# Patient Record
Sex: Female | Born: 1970 | Race: Black or African American | Hispanic: No | Marital: Single | Smoking: Never smoker
Health system: Southern US, Community
[De-identification: ages and names within clinical notes are randomized; demographics above are authoritative.]

## PROBLEM LIST (undated history)

## (undated) DIAGNOSIS — J45909 Unspecified asthma, uncomplicated: Secondary | ICD-10-CM

## (undated) DIAGNOSIS — N289 Disorder of kidney and ureter, unspecified: Secondary | ICD-10-CM

## (undated) DIAGNOSIS — E079 Disorder of thyroid, unspecified: Secondary | ICD-10-CM

## (undated) DIAGNOSIS — E039 Hypothyroidism, unspecified: Secondary | ICD-10-CM

## (undated) DIAGNOSIS — D649 Anemia, unspecified: Secondary | ICD-10-CM

## (undated) DIAGNOSIS — F419 Anxiety disorder, unspecified: Secondary | ICD-10-CM

## (undated) HISTORY — PX: FOOT SURGERY: SHX648

## (undated) HISTORY — DX: Morbid (severe) obesity due to excess calories: E66.01

## (undated) HISTORY — PX: TUBAL LIGATION: SHX77

## (undated) HISTORY — DX: Hypothyroidism, unspecified: E03.9

## (undated) HISTORY — PX: BREAST BIOPSY: SHX20

## (undated) HISTORY — DX: Unspecified asthma, uncomplicated: J45.909

---

## 2006-09-20 ENCOUNTER — Emergency Department: Payer: Self-pay | Admitting: Emergency Medicine

## 2008-01-05 ENCOUNTER — Emergency Department: Payer: Self-pay | Admitting: Emergency Medicine

## 2008-03-26 ENCOUNTER — Emergency Department: Payer: Self-pay | Admitting: Emergency Medicine

## 2009-06-16 ENCOUNTER — Emergency Department: Payer: Self-pay | Admitting: Emergency Medicine

## 2009-06-24 ENCOUNTER — Ambulatory Visit: Payer: Self-pay | Admitting: Family Medicine

## 2009-06-24 ENCOUNTER — Ambulatory Visit: Payer: Self-pay | Admitting: Emergency Medicine

## 2011-02-19 ENCOUNTER — Emergency Department: Payer: Self-pay | Admitting: Emergency Medicine

## 2012-03-01 ENCOUNTER — Emergency Department: Payer: Self-pay | Admitting: Emergency Medicine

## 2012-03-01 LAB — CK TOTAL AND CKMB (NOT AT ARMC)
CK, Total: 138 U/L (ref 21–215)
CK-MB: 0.9 ng/mL (ref 0.5–3.6)

## 2012-03-01 LAB — URINALYSIS, COMPLETE
Blood: NEGATIVE
Ph: 5 (ref 4.5–8.0)
RBC,UR: 1 /HPF (ref 0–5)
WBC UR: 3 /HPF (ref 0–5)

## 2012-03-01 LAB — DRUG SCREEN, URINE
Barbiturates, Ur Screen: NEGATIVE (ref ?–200)
Benzodiazepine, Ur Scrn: NEGATIVE (ref ?–200)
Cannabinoid 50 Ng, Ur ~~LOC~~: NEGATIVE (ref ?–50)
MDMA (Ecstasy)Ur Screen: NEGATIVE (ref ?–500)
Methadone, Ur Screen: NEGATIVE (ref ?–300)
Phencyclidine (PCP) Ur S: NEGATIVE (ref ?–25)

## 2012-03-01 LAB — COMPREHENSIVE METABOLIC PANEL
Albumin: 3.7 g/dL (ref 3.4–5.0)
Alkaline Phosphatase: 63 U/L (ref 50–136)
BUN: 12 mg/dL (ref 7–18)
Creatinine: 1.17 mg/dL (ref 0.60–1.30)
EGFR (African American): >60
EGFR (Non-African Amer.): 59 — ABNORMAL LOW
Glucose: 101 mg/dL — ABNORMAL HIGH (ref 65–99)
Potassium: 3.5 mmol/L (ref 3.5–5.1)
Sodium: 141 mmol/L (ref 136–145)
Total Protein: 7.7 g/dL (ref 6.4–8.2)

## 2012-03-01 LAB — CBC
HCT: 35.8 % (ref 35.0–47.0)
HGB: 11.6 g/dL — ABNORMAL LOW (ref 12.0–16.0)
MCH: 28.6 pg (ref 26.0–34.0)
Platelet: 238 10*3/uL (ref 150–440)
RDW: 14 % (ref 11.5–14.5)
WBC: 6.8 10*3/uL (ref 3.6–11.0)

## 2012-03-01 LAB — TROPONIN I: Troponin-I: 0.02 ng/mL

## 2012-03-01 LAB — LIPASE, BLOOD: Lipase: 95 U/L (ref 73–393)

## 2013-09-07 ENCOUNTER — Emergency Department: Payer: Self-pay | Admitting: Emergency Medicine

## 2013-09-07 LAB — COMPREHENSIVE METABOLIC PANEL
ANION GAP: 4 — AB (ref 7–16)
AST: 20 U/L (ref 15–37)
Albumin: 3.8 g/dL (ref 3.4–5.0)
Alkaline Phosphatase: 57 U/L
BUN: 10 mg/dL (ref 7–18)
Bilirubin,Total: 0.8 mg/dL (ref 0.2–1.0)
CALCIUM: 9.1 mg/dL (ref 8.5–10.1)
CREATININE: 0.96 mg/dL (ref 0.60–1.30)
Chloride: 106 mmol/L (ref 98–107)
Co2: 28 mmol/L (ref 21–32)
EGFR (African American): >60
GLUCOSE: 86 mg/dL (ref 65–99)
Osmolality: 274 (ref 275–301)
POTASSIUM: 4.1 mmol/L (ref 3.5–5.1)
SGPT (ALT): 27 U/L (ref 12–78)
SODIUM: 138 mmol/L (ref 136–145)
TOTAL PROTEIN: 8 g/dL (ref 6.4–8.2)

## 2013-09-07 LAB — URINALYSIS, COMPLETE
BILIRUBIN, UR: NEGATIVE
Blood: NEGATIVE
Glucose,UR: NEGATIVE mg/dL (ref 0–75)
Ketone: NEGATIVE
Leukocyte Esterase: NEGATIVE
Nitrite: POSITIVE
PROTEIN: NEGATIVE
Ph: 5 (ref 4.5–8.0)
Specific Gravity: 1.02 (ref 1.003–1.030)
Squamous Epithelial: NONE SEEN

## 2013-09-07 LAB — CBC
HCT: 38.1 % (ref 35.0–47.0)
HGB: 12.3 g/dL (ref 12.0–16.0)
MCH: 27.5 pg (ref 26.0–34.0)
MCHC: 32.2 g/dL (ref 32.0–36.0)
MCV: 86 fL (ref 80–100)
PLATELETS: 259 10*3/uL (ref 150–440)
RBC: 4.46 10*6/uL (ref 3.80–5.20)
RDW: 13.7 % (ref 11.5–14.5)
WBC: 6.6 10*3/uL (ref 3.6–11.0)

## 2013-09-07 LAB — LIPASE, BLOOD: Lipase: 90 U/L (ref 73–393)

## 2013-09-07 LAB — TROPONIN I

## 2013-09-07 LAB — PREGNANCY, URINE: PREGNANCY TEST, URINE: NEGATIVE m[IU]/mL

## 2014-03-14 ENCOUNTER — Ambulatory Visit: Payer: Self-pay

## 2014-06-26 ENCOUNTER — Emergency Department: Payer: Self-pay | Admitting: Emergency Medicine

## 2015-09-29 ENCOUNTER — Encounter: Payer: Self-pay | Admitting: Emergency Medicine

## 2015-09-29 ENCOUNTER — Emergency Department
Admission: EM | Admit: 2015-09-29 | Discharge: 2015-09-29 | Disposition: A | Discharge status: Home / Self Care | Payer: Self-pay | Attending: Emergency Medicine | Admitting: Emergency Medicine

## 2015-09-29 DIAGNOSIS — N643 Galactorrhea not associated with childbirth: Secondary | ICD-10-CM | POA: Insufficient documentation

## 2015-09-29 DIAGNOSIS — F419 Anxiety disorder, unspecified: Secondary | ICD-10-CM | POA: Insufficient documentation

## 2015-09-29 HISTORY — DX: Disorder of thyroid, unspecified: E07.9

## 2015-09-29 LAB — BASIC METABOLIC PANEL
Anion gap: 5 (ref 5–15)
BUN: 16 mg/dL (ref 6–20)
CHLORIDE: 109 mmol/L (ref 101–111)
CO2: 24 mmol/L (ref 22–32)
CREATININE: 1.05 mg/dL — AB (ref 0.44–1.00)
Calcium: 9.2 mg/dL (ref 8.9–10.3)
Glucose, Bld: 88 mg/dL (ref 65–99)
Potassium: 4 mmol/L (ref 3.5–5.1)
Sodium: 138 mmol/L (ref 135–145)

## 2015-09-29 LAB — CBC WITH DIFFERENTIAL/PLATELET
Basophils Absolute: 0.1 10*3/uL (ref 0–0.1)
Basophils Relative: 1 %
Eosinophils Absolute: 0.2 10*3/uL (ref 0–0.7)
Eosinophils Relative: 3 %
HEMATOCRIT: 35.5 % (ref 35.0–47.0)
HEMOGLOBIN: 11.5 g/dL — AB (ref 12.0–16.0)
LYMPHS ABS: 3 10*3/uL (ref 1.0–3.6)
LYMPHS PCT: 43 %
MCH: 27 pg (ref 26.0–34.0)
MCHC: 32.4 g/dL (ref 32.0–36.0)
MCV: 83.5 fL (ref 80.0–100.0)
Monocytes Absolute: 0.6 10*3/uL (ref 0.2–0.9)
Monocytes Relative: 9 %
NEUTROS PCT: 44 %
Neutro Abs: 3.1 10*3/uL (ref 1.4–6.5)
Platelets: 250 10*3/uL (ref 150–440)
RBC: 4.25 MIL/uL (ref 3.80–5.20)
RDW: 14.4 % (ref 11.5–14.5)
WBC: 6.9 10*3/uL (ref 3.6–11.0)

## 2015-09-29 LAB — URINALYSIS COMPLETE WITH MICROSCOPIC (ARMC ONLY)
Bacteria, UA: NONE SEEN
Bilirubin Urine: NEGATIVE
Glucose, UA: NEGATIVE mg/dL
KETONES UR: NEGATIVE mg/dL
LEUKOCYTES UA: NEGATIVE
Nitrite: NEGATIVE
PH: 5 (ref 5.0–8.0)
PROTEIN: NEGATIVE mg/dL
Specific Gravity, Urine: 1.021 (ref 1.005–1.030)

## 2015-09-29 LAB — POCT PREGNANCY, URINE: PREG TEST UR: NEGATIVE

## 2015-09-29 NOTE — Discharge Instructions (Signed)
Galactorrhea Galactorrhea is an abnormal milky discharge from the breast. The discharge may come from one or both nipples. The fluid is often white, yellow, or green. It is different from the normal milk produced in nursing mothers. Galactorrhea usually occurs in women, but it can sometimes affect men. Various things can cause galactorrhea. It is often caused by irritation of the breast, which can result from injury, stimulation during sexual activity, or clothes rubbing against the nipple. It may also be related to medicines or changes in hormone levels. In many cases, galactorrhea will go away without treatment. However, galactorrhea can also be a sign of something more serious, such as diseases of the kidney or thyroid or problems with the pituitary gland. Your health care provider may do various tests to help determine the cause. Sometimes the cause is unknown. It is important to monitor your condition to make sure that it goes away. HOME CARE INSTRUCTIONS  Watch your condition for any changes. The following actions may help to lessen any discomfort that you are feeling:  Take medicines only as directed by your health care provider.  Do not squeeze your breasts or nipples.  Avoid breast stimulation during sexual activity.   Perform a breast self-exam once a month. Doing this more often can irritate your breasts.  Avoid clothes that rub on your nipples.  Use breast pads to absorb the discharge.  Wear a breast binder or a support bra to help prevent clothes from rubbing on your nipples.  Keep all follow-up visits as directed by your health care provider. This is important. SEEK MEDICAL CARE IF:  You develop hot flashes, vaginal dryness, or a lack of sexual desire.  You stop having menstrual periods, or they are irregular or far apart.  You have headaches.  You have vision problems. SEEK IMMEDIATE MEDICAL CARE IF:  You have breast discharge that is bloody or puslike.  You have  breast pain.  You feel a lump in your breast.  You have wrinkling or dimpling on your breast.  Your breast becomes red and swollen.   This information is not intended to replace advice given to you by your health care provider. Make sure you discuss any questions you have with your health care provider.   Document Released: 09/24/2004 Document Revised: 09/07/2014 Document Reviewed: 03/20/2014 Elsevier Interactive Patient Education 2016 Elsevier Inc.  

## 2015-09-29 NOTE — ED Notes (Signed)
States breasts are leaking fluid.  Describes fluid as clear/ oily secretions.  Patient noticed this today.  Also, noticed a swollen area to neck/ scapular area. Onset of symptom last week.  C/O swollen area being tender.

## 2015-09-29 NOTE — ED Provider Notes (Signed)
[facility] Emergency Department Provider Note  ____________________________________________    I have reviewed the triage vital signs and the nursing notes.   HISTORY  Chief Complaint Breast Problem    HPI Megan [name] is a 45 y.o. female who presents with complaints of nipple discharge from both breasts today. She describes the fluid as clear and oily. She denies pain in her breast. She has not felt any masses. Over the last day they have felt tight and then today she had secretions from both breasts which she noticed in her bra. She had a mammogram one year ago. Denies fevers or chills. No tenderness. No redness     Past Medical History  Diagnosis Date  . Thyroid disease     Hypothyroidism    There are no active problems to display for this patient.   Past Surgical History  Procedure Laterality Date  . Cesarean section    . Tubal ligation      No current outpatient prescriptions on file.  Allergies Review of patient's allergies indicates no known allergies.  No family history on file.  Social History Social History  Substance Use Topics  . Smoking status: Never Smoker   . Smokeless tobacco: Never Used  . Alcohol Use: No    Review of Systems  Constitutional: Negative for fever.  ENT: Negative for sore throat Cardiovascular: Negative for breast pain     Skin: Negative for rash.  Psychiatric: Positive for anxiety    ____________________________________________   PHYSICAL EXAM:  VITAL SIGNS: ED Triage Vitals  Enc Vitals Group     BP 09/29/15 1528 124/75 mmHg     Pulse Rate 09/29/15 1528 72     Resp 09/29/15 1528 18     Temp 09/29/15 1528 98 F (36.7 C)     Temp Source 09/29/15 1528 Oral     SpO2 09/29/15 1528 100 %     Weight 09/29/15 1528 200 lb (90.719 kg)     Height 09/29/15 1528  (1.651 m)     Head Cir --      Peak Flow --      Pain Score 09/29/15 1530 5     Pain Loc --      Pain Edu? --       Excl. in GC? --      Constitutional: Alert and oriented. Well appearing and in no distress. Eyes: Conjunctivae are normal.  ENT   Head: Normocephalic and atraumatic.   Mouth/Throat: Mucous membranes are moist. Cardiovascular: Normal rate, regular rhythm. Normal and symmetric distal pulses are present in all extremities. Breast exam is unremarkable, there is some nodularity bilaterally although I'm unable to express any discharge. No erythema or signs of infection. No Orange peel sign Respiratory: Normal respiratory effort without tachypnea nor retractions.  Gastrointestinal: Soft and non-tender in all quadrants. No distention. There is no CVA tenderness. Genitourinary: deferred  Neurologic:  Normal speech and language. No gross focal neurologic deficits are appreciated. Skin:  Skin is warm, dry and intact. No rash noted. Psychiatric: Mood and affect are normal. Patient exhibits appropriate insight and judgment.  ____________________________________________    LABS (pertinent positives/negatives)  Labs Reviewed  URINALYSIS COMPLETEWITH MICROSCOPIC ([facility] ONLY) - Abnormal; Notable for the following:    Color, Urine YELLOW (*)    APPearance CLEAR (*)    Hgb urine dipstick 3+ (*)    Squamous Epithelial / LPF 0-5 (*)    All other components within normal limits  CBC WITH  DIFFERENTIAL/PLATELET - Abnormal; Notable for the following:    Hemoglobin 11.5 (*)    All other components within normal limits  BASIC METABOLIC PANEL - Abnormal; Notable for the following:    Creatinine, Ser 1.05 (*)    All other components within normal limits  POC URINE PREG, ED  POCT PREGNANCY, URINE    ____________________________________________   EKG  None  ____________________________________________    RADIOLOGY I have personally reviewed any xrays that were ordered on this patient: None  ____________________________________________   PROCEDURES  Procedure(s) performed:  none  Critical Care performed: none  ____________________________________________   INITIAL IMPRESSION / ASSESSMENT AND PLAN / ED COURSE  Pertinent labs & imaging results that were available during my care of the patient were reviewed by me and considered in my medical decision making (see chart for details).  Patient presents with bilateral nipple discharge today. This appears to have resolved. She is not pregnant. No evidence of infection. No masses felt. Suspect galactorrhea. Recommend follow-up with gynecology  ____________________________________________   FINAL CLINICAL IMPRESSION(S) / ED DIAGNOSES  Final diagnoses:  Galactorrhea     Jene Every, MD 09/29/15 1800

## 2015-09-29 NOTE — ED Notes (Signed)
Discussed discharge instructions, prescriptions, and follow-up care with patient. No questions or concerns at this time. Pt stable at discharge.  

## 2015-11-04 ENCOUNTER — Encounter (INDEPENDENT_AMBULATORY_CARE_PROVIDER_SITE_OTHER): Payer: Self-pay

## 2015-11-04 ENCOUNTER — Encounter: Payer: Self-pay | Admitting: *Deleted

## 2015-11-04 ENCOUNTER — Ambulatory Visit: Payer: Self-pay | Attending: Oncology | Admitting: *Deleted

## 2015-11-04 VITALS — BP 122/70 | HR 73 | Temp 97.7°F | Ht 66.14 in | Wt 226.2 lb

## 2015-11-04 DIAGNOSIS — Z0000 Encounter for general adult medical examination without abnormal findings: Secondary | ICD-10-CM

## 2015-11-04 DIAGNOSIS — N6452 Nipple discharge: Secondary | ICD-10-CM

## 2015-11-04 NOTE — Progress Notes (Signed)
Subjective:     Patient ID: Megan [name], female   DOB: Aug 02, 1971, 45 y.o.   MRN: [id number]  HPI   Review of Systems     Objective:   Physical Exam  Pulmonary/Chest: Right breast exhibits no inverted nipple, no mass, no nipple discharge, no skin change and no tenderness. Left breast exhibits no inverted nipple, no mass, no nipple discharge, no skin change and no tenderness. Breasts are asymmetrical.  Left breast lower than the right breast  Abdominal: There is no splenomegaly or hepatomegaly.  Genitourinary: Cervix exhibits no motion tenderness, no discharge and no friability. Right adnexum displays no mass, no tenderness and no fullness. Left adnexum displays no mass, no tenderness and no fullness. No erythema, tenderness or bleeding in the vagina. No foreign body around the vagina. No signs of injury around the vagina. No vaginal discharge found.       Assessment:     45 [facility] Black [facility].  States she has had one episode of spontaneous clear nipple discharge that soaked through her bra and her shirt a couple of weeks ago.  States she also experienced some "tightening" of the left breast at the same time.  Family history of breast cancer includes a paternal great grandmother.  On clinical breast exam there is no dominant mass, skin changes, nipple discharge or lymphadenopathy.  Taught self breast awareness.  Specimen collected for pap smear without difficulty.  Patient has been screened for eligibility.  She does not have any insurance, Medicare or Medicaid.  She also meets financial eligibility.  Hand-out given on the Affordable Care Act.    Plan:     Bilateral diagnostic mammogram with ultrasound ordered.  Discussed with patient that if no findings on imaging will refer for surgical consult.  Patient is agreeable.  Specimen for pap smear sent to the lab. Will follow-up per [facility] protocol.

## 2015-11-04 NOTE — Patient Instructions (Signed)
Gave patient hand-out, Women Staying Healthy, Active and Well from [facility], with education on breast health, pap smears, heart and colon health. 

## 2015-11-07 LAB — PAP LB AND HPV HIGH-RISK
HPV, HIGH-RISK: NEGATIVE
PAP SMEAR COMMENT: 0

## 2015-11-15 ENCOUNTER — Ambulatory Visit: Payer: Self-pay

## 2015-11-15 ENCOUNTER — Other Ambulatory Visit: Payer: Self-pay

## 2015-11-18 ENCOUNTER — Ambulatory Visit
Admission: RE | Admit: 2015-11-18 | Discharge: 2015-11-18 | Disposition: A | Discharge status: Home / Self Care | Payer: Self-pay | Source: Ambulatory Visit | Attending: Oncology | Admitting: Oncology

## 2015-11-18 DIAGNOSIS — N6452 Nipple discharge: Secondary | ICD-10-CM

## 2015-11-19 ENCOUNTER — Encounter: Payer: Self-pay | Admitting: *Deleted

## 2015-11-19 ENCOUNTER — Encounter: Payer: Self-pay | Admitting: General Surgery

## 2015-11-20 ENCOUNTER — Encounter: Payer: Self-pay | Admitting: General Surgery

## 2015-11-20 NOTE — Progress Notes (Signed)
Talked to patient today and reviewed her birads 3 mammogram results.  Discussed with the patient that since her nipple discharge was in the same breast as the noted nodule on mammography, I felt it would be prudent to have a surgical consultation for further evaluation.  Patient is agreeable to the plan.  Joellyn QuailsChristy Burton will schedule [facility].  Will follow [facility] as [facility].

## 2015-12-03 ENCOUNTER — Encounter: Payer: Self-pay | Admitting: General Surgery

## 2015-12-03 ENCOUNTER — Ambulatory Visit (INDEPENDENT_AMBULATORY_CARE_PROVIDER_SITE_OTHER): Payer: PRIVATE HEALTH INSURANCE | Admitting: General Surgery

## 2015-12-03 VITALS — BP 130/74 | HR 82 | Resp 12 | Ht 65.0 in | Wt 223.0 lb

## 2015-12-03 DIAGNOSIS — N6452 Nipple discharge: Secondary | ICD-10-CM | POA: Diagnosis not present

## 2015-12-03 DIAGNOSIS — R928 Other abnormal and inconclusive findings on diagnostic imaging of breast: Secondary | ICD-10-CM

## 2015-12-03 NOTE — Patient Instructions (Signed)
The patient is aware to call back for any questions or concerns.  

## 2015-12-03 NOTE — Progress Notes (Signed)
Patient ID: Megan [name], female   DOB: October 30, 1970, 45 y.o.   MRN: [id number]  Chief Complaint  Patient presents with  . Other    mammogram    HPI Megan [name] is a 45 y.o. female who presents for a breast evaluation. The most recent mammogram was done on 11/18/15.  Patient does perform regular self breast checks and gets doe snot get regular mammograms done.  She states she noticed left nipple discharge-clear in January and she went to the ED. She has not noticed andy since that episode. She can;'t feel a lump she stats it just feels tight. Denis pain, breast injury or trauma.  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 She has a 43 year old son and her 43 year old daughter with special needs passed at age 584.  HPI  Past Medical History  Diagnosis Date  . Thyroid disease     Hypothyroidism    Past Surgical History  Procedure Laterality Date  . Cesarean section    . Tubal ligation      Family History  Problem Relation Age of Onset  . Breast cancer Maternal Grandmother     great grandmother    Social History Social History  Substance Use Topics  . Smoking status: Never Smoker   . Smokeless tobacco: Never Used  . Alcohol Use: No    No Known Allergies  No current outpatient prescriptions on file.   No current facility-administered medications for this visit.    Review of Systems Review of Systems  Constitutional: Negative.   Respiratory: Negative.   Cardiovascular: Negative.     Blood pressure 130/74, pulse 82, resp. rate 12, height 5\' 5"  (1.651 m), weight 223 lb (101.152 kg), last menstrual period 10/23/2015.  Physical Exam Physical Exam  Constitutional: She is oriented to person, place, and time. She appears well-developed and well-nourished.  HENT:  Mouth/Throat: Oropharynx is clear and moist.  Eyes: Conjunctivae are normal. No scleral icterus.  Neck: Neck supple.  Cardiovascular: Normal rate, regular rhythm and normal heart sounds.   Pulmonary/Chest:  Effort normal and breath sounds normal. Right breast exhibits no inverted nipple, no mass, no nipple discharge, no skin change and no tenderness. Left breast exhibits no inverted nipple, no mass, no nipple discharge, no skin change and no tenderness.    Extra nipple right breast. Left breast droops > right breast.   Lymphadenopathy:    She has no cervical adenopathy.    She has no axillary adenopathy.  Neurological: She is alert and oriented to person, place, and time.  Skin: Skin is warm and dry.  Psychiatric: Her behavior is normal.    Data Reviewed Bilateral mammogram and left breast ultrasound of 11/18/2015 reviewed.  Assessment    Nipple drainage, resolved, likely ruptured breast cyst based on volume described.  Small mammographic/ultrasound area warranting 6 month follow-up, low suspicion for malignancy.    Plan    The patient will have follow-up mammogram and clinical exam in 6 months [facility].  The patient was encouraged to call if she has recurrent drainage for early assessment here.     Recommend follow up in 6 months with a left mammogram with Megan PoseySheena Lambert RN.   The patient is aware to call back for any questions, concerns, worsening or new symptoms.   PCP:  Megan [name] Community Ref:  Megan PoseySheena Lambert RN [facility] This information has been scribed by Megan [name].    Megan [name] 12/03/2015, 3:44  PM

## 2015-12-04 ENCOUNTER — Encounter: Payer: Self-pay | Admitting: *Deleted

## 2015-12-04 ENCOUNTER — Other Ambulatory Visit: Payer: Self-pay | Admitting: *Deleted

## 2015-12-04 DIAGNOSIS — N63 Unspecified lump in unspecified breast: Secondary | ICD-10-CM

## 2015-12-04 NOTE — Progress Notes (Signed)
Letter mailed to patient with her appointment for her 6 month follow-up mammogram and ultrasound on 05/21/16 @ 2:00.  [facility] [place].

## 2016-05-21 ENCOUNTER — Ambulatory Visit: Payer: Self-pay

## 2016-05-21 ENCOUNTER — Ambulatory Visit: Payer: Self-pay | Attending: Oncology

## 2016-06-19 ENCOUNTER — Telehealth: Payer: Self-pay | Admitting: *Deleted

## 2016-07-09 ENCOUNTER — Ambulatory Visit
Admission: RE | Admit: 2016-07-09 | Discharge: 2016-07-09 | Disposition: A | Discharge status: Home / Self Care | Payer: Self-pay | Source: Ambulatory Visit | Attending: Oncology | Admitting: Oncology

## 2016-07-09 DIAGNOSIS — N63 Unspecified lump in unspecified breast: Secondary | ICD-10-CM

## 2016-07-13 ENCOUNTER — Encounter: Payer: Self-pay | Admitting: *Deleted

## 2016-07-13 NOTE — Progress Notes (Signed)
Mailed patient a letter with her mammogram results and need for 6 month follow up mammogram and appointment for follow-up.  [facility] @ 8:00.  [place]

## 2016-12-30 ENCOUNTER — Ambulatory Visit: Payer: Self-pay

## 2017-02-10 ENCOUNTER — Ambulatory Visit
Admission: RE | Admit: 2017-02-10 | Discharge: 2017-02-10 | Disposition: A | Discharge status: Home / Self Care | Payer: Self-pay | Source: Ambulatory Visit | Attending: Oncology | Admitting: Oncology

## 2017-02-10 ENCOUNTER — Ambulatory Visit: Payer: Self-pay | Attending: Oncology

## 2017-02-10 ENCOUNTER — Encounter (INDEPENDENT_AMBULATORY_CARE_PROVIDER_SITE_OTHER): Payer: Self-pay

## 2017-02-10 VITALS — BP 110/75 | HR 67 | Temp 98.4°F | Ht 66.0 in | Wt 228.0 lb

## 2017-02-10 DIAGNOSIS — N63 Unspecified lump in unspecified breast: Secondary | ICD-10-CM

## 2017-02-10 NOTE — Progress Notes (Signed)
Subjective:     Patient ID: Megan [name], female   DOB: 10/04/1970, 46 y.o.   MRN: [id number]  HPI   Review of Systems     Objective:   Physical Exam     Assessment:         Plan:

## 2017-02-10 NOTE — Progress Notes (Signed)
Subjective:     Patient ID: Megan RanLakisha D [name], female   DOB: 05-Dec-1970, 46 y.o.   MRN: [id number]  HPI   Review of Systems     Objective:   Physical Exam  Pulmonary/Chest: Right breast exhibits no inverted nipple, no mass, no nipple discharge, no skin change and no tenderness. Left breast exhibits no inverted nipple, no mass, no nipple discharge, no skin change and no tenderness. Breasts are asymmetrical.  Left breast lower than right;        Assessment:    46 [facility].  Patient screened, and meets [facility] eligibility.  Patient does not have insurance, Medicare or Medicaid.  Handout given on Affordable Care Act.  Instructed patient on breast self-exam using teach back method.  Patient had a six month follow-up mammogram, and ultrasound for a 7mm left breast mass 07/09/17 with stable results.  No mass or lump palpated on clinical breast exam.    Plan:     Sent for bilateral diagnostic mammogram, and ultrasound.

## 2017-03-18 ENCOUNTER — Ambulatory Visit
Admission: RE | Admit: 2017-03-18 | Discharge: 2017-03-18 | Disposition: A | Discharge status: Home / Self Care | Payer: Self-pay | Source: Ambulatory Visit | Attending: Oncology | Admitting: Oncology

## 2017-03-29 ENCOUNTER — Other Ambulatory Visit: Payer: Self-pay | Admitting: *Deleted

## 2017-03-29 ENCOUNTER — Encounter: Payer: Self-pay | Admitting: *Deleted

## 2017-03-29 DIAGNOSIS — N63 Unspecified lump in unspecified breast: Secondary | ICD-10-CM

## 2017-03-29 NOTE — Progress Notes (Signed)
Letter mailed to inform patient of her 6 month follow up ultrasound appointment on November 18, 2017 @ 9:20.  [facility] to [place].

## 2017-08-07 ENCOUNTER — Other Ambulatory Visit: Payer: Self-pay

## 2017-08-07 ENCOUNTER — Emergency Department
Admission: EM | Admit: 2017-08-07 | Discharge: 2017-08-07 | Disposition: A | Discharge status: Home / Self Care | Payer: Self-pay | Attending: Emergency Medicine | Admitting: Emergency Medicine

## 2017-08-07 DIAGNOSIS — E039 Hypothyroidism, unspecified: Secondary | ICD-10-CM | POA: Insufficient documentation

## 2017-08-07 DIAGNOSIS — G43801 Other migraine, not intractable, with status migrainosus: Secondary | ICD-10-CM | POA: Insufficient documentation

## 2017-08-07 DIAGNOSIS — R42 Dizziness and giddiness: Secondary | ICD-10-CM | POA: Insufficient documentation

## 2017-08-07 MED ORDER — BUTALBITAL-APAP-CAFFEINE 50-325-40 MG PO TABS: 1.0000 | ORAL_TABLET | Freq: Four times a day (QID) | ORAL | 0 refills | Status: AC | PRN

## 2017-08-07 MED ORDER — METOCLOPRAMIDE HCL 5 MG/ML IJ SOLN
10.0000 mg | Freq: Once | INTRAMUSCULAR | Status: AC
  Administered 2017-08-07: 10 mg via INTRAVENOUS
  Filled 2017-08-07: qty 2

## 2017-08-07 MED ORDER — DIPHENHYDRAMINE HCL 50 MG/ML IJ SOLN
25.0000 mg | Freq: Once | INTRAMUSCULAR | Status: AC
  Administered 2017-08-07: 25 mg via INTRAVENOUS
  Filled 2017-08-07: qty 1

## 2017-08-07 MED ORDER — KETOROLAC TROMETHAMINE 30 MG/ML IJ SOLN
15.0000 mg | Freq: Once | INTRAMUSCULAR | Status: AC
  Administered 2017-08-07: 15 mg via INTRAVENOUS
  Filled 2017-08-07: qty 1

## 2017-08-07 MED ORDER — SODIUM CHLORIDE 0.9 % IV BOLUS (SEPSIS)
1000.0000 mL | Freq: Once | INTRAVENOUS | Status: AC
  Administered 2017-08-07: 1000 mL via INTRAVENOUS

## 2017-08-07 NOTE — ED Provider Notes (Signed)
[facility] Emergency Department Provider Note  ____________________________________________  Time seen: Approximately 9:59 AM  I have reviewed the triage vital signs and the nursing notes.   HISTORY  Chief Complaint Migraine   HPI Megan [name] is a 46 y.o. female with h/o hypothyroidism and migraineswho presents for evaluation of HA. Patient reports a HA for 2-3 days. Initially mild which got progressively worse over the course of the day. Today she was at work and the pain got worse. Patient started to fell like she was going to pass out and her coworkers called 911. Patient is complaining of severe, throbbing/sharp diffuse HA associated with dizziness and blurry vision and photophobia. Reports that this is identical to her chronic HAs which she has several times a week. Patient has not taken anything for HA. Patient denies weakness or numbness, sudden onset HA or HA with maximal intensity at onset, fever, neck stiffness, history of immunosuppression, Jaw claudication, muscle aches, temporal artery pain, history of other household members with similar symptoms, pregnancy, clotting disorder, trauma, eye pain, recent cervical manipulation with headache.      Past Medical History:  Diagnosis Date  . Thyroid disease    Hypothyroidism    Patient Active Problem List   Diagnosis Date Noted  . Nipple discharge 12/03/2015  . Abnormal mammogram of left breast 12/03/2015    Past Surgical History:  Procedure Laterality Date  . CESAREAN SECTION    . TUBAL LIGATION      Prior to Admission medications   Medication Sig Start Date End Date Taking? Authorizing Provider  butalbital-acetaminophen-caffeine (FIORICET, ESGIC) 50-325-40 MG tablet Take 1-2 tablets by mouth every 6 (six) hours as needed for headache. 08/07/17 08/07/18  Nita SickleVeronese, [place], MD    Allergies Patient has no known allergies.  Family History  Problem Relation Age of Onset  . Breast  cancer Maternal Grandmother        great grandmother    Social History Social History   Tobacco Use  . Smoking status: Never Smoker  . Smokeless tobacco: Never Used  Substance Use Topics  . Alcohol use: No  . Drug use: No    Review of Systems  Constitutional: Negative for fever. + dizziness Eyes: Negative for visual changes. + photophobia ENT: Negative for sore throat. Neck: No neck pain  Cardiovascular: Negative for chest pain. Respiratory: Negative for shortness of breath. Gastrointestinal: Negative for abdominal pain, vomiting or diarrhea. Genitourinary: Negative for dysuria. Musculoskeletal: Negative for back pain. Skin: Negative for rash. Neurological: Negative for weakness or numbness. + HA Psych: No SI or HI  ____________________________________________   PHYSICAL EXAM:  VITAL SIGNS: ED Triage Vitals [08/07/17 0950]  Enc Vitals Group     BP (!) 158/99     Pulse Rate 69     Resp 16     Temp (!) 97.5 F (36.4 C)     Temp Source Oral     SpO2 100 %     Weight 228 lb (103.4 kg)     Height 5\' 5"  (1.651 m)     Head Circumference      Peak Flow      Pain Score      Pain Loc      Pain Edu?      Excl. in GC?     Constitutional: Alert and oriented, in mild distress due to pain. HEENT:      Head: Normocephalic and atraumatic.         Eyes: Conjunctivae  are normal. Sclera is non-icteric. EOMI, PERRL       Mouth/Throat: Mucous membranes are moist.       Neck: Supple with no signs of meningismus. Cardiovascular: Regular rate and rhythm. No murmurs, gallops, or rubs. 2+ symmetrical distal pulses are present in all extremities. No JVD. Respiratory: Normal respiratory effort. Lungs are clear to auscultation bilaterally. No wheezes, crackles, or rhonchi.  Gastrointestinal: Soft, non tender, and non distended with positive bowel sounds. No rebound or guarding. Genitourinary: No CVA tenderness. Musculoskeletal: Nontender with normal range of motion in all  extremities. No edema, cyanosis, or erythema of extremities. Neurologic: Normal speech and language. A & O x3, PERRL, EOMI, no nystagmus, CN II-XII intact, motor testing reveals good tone and bulk throughout. There is no evidence of pronator drift or dysmetria. Muscle strength is 5/5 throughout.  Sensory examination is intact. Gait deferred Skin: Skin is warm, dry and intact. No rash noted. Psychiatric: Mood and affect are normal. Speech and behavior are normal.  ____________________________________________   LABS (all labs ordered are listed, but only abnormal results are displayed)  Labs Reviewed - No data to display ____________________________________________  EKG  none ____________________________________________  RADIOLOGY  none  ____________________________________________   PROCEDURES  Procedure(s) performed: None Procedures Critical Care performed:  None ____________________________________________   INITIAL IMPRESSION / ASSESSMENT AND PLAN / ED COURSE   46 y.o. female with h/o hypothyroidism and migraineswho presents for evaluation of HA which is identical to patient's chronic HAs. She is neuro intact with normal VS and no evidence of meningismus.  Low suspicion for more serious or life threatening etiology of HA based on history and exam. No sudden onset thunderclap HA, onset with exertion, vomiting, focal neurologic deficits, to suggest increased risk of subarachnoid hemorrhage. No fever, neck pain, neck stiffness, or meningismus on exam to suggest meningitis. No fevers, altered mental status, unusual behavior to suggest encephalitis. No focal neurologic deficits by history or exam to suggest central venous thrombosis. No constitutional symptoms including fever, fatigue, weight loss, temporal scalp tenderness, jaw claudication, visual loss, to suggest temporal arteritis. No immunocompromise to suggest increased risk for intracranial infectious disease. No visual  changes or findings on ocular exam to suggest acute angle closure glaucoma. No reports of toxic exposures including carbon monoxide or other household members with similar symptoms.  Will give migraine cocktail and reassess. Patient is s/p tubal ligation more than 10 years ago so no pregnancy test has been ordered.     ----------------------------------------- 10:00 AM on 08/07/2017 ----------------------------------------- OBSERVATION CARE: This patient is being placed under observation care for the following reasons: Headache patients requiring repeat treatments and serial exams to determine if they  improve with treatment  ----------------------------------------- 11:00 AM on 08/07/2017 ----------------------------------------- Patient feeling improved.   ----------------------------------------- 11:32 AM on 08/07/2017 ----------------------------------------- END OF OBSERVATION STATUS: After an appropriate period of observation, this patient is being discharged due to the following reason(s):  Patient reports full resolution of her headache and is requesting discharge. She is going to be given a prescription for Fioricet. She remains well appearing and neurologically intact. Discussed return precautions and recommended that she follows up with her primary care doctor since her headaches are so frequent for neurology referral.     As part of my medical decision making, I reviewed the following data within the electronic MEDICAL RECORD NUMBER Nursing notes reviewed and incorporated, Notes from prior ED visits and [place] Controlled Substance Database    Pertinent labs & imaging results that were available during  my care of the patient were reviewed by me and considered in my medical decision making (see chart for details).    ____________________________________________   FINAL CLINICAL IMPRESSION(S) / ED DIAGNOSES  Final diagnoses:  Other migraine with status migrainosus, not  intractable      NEW MEDICATIONS STARTED DURING THIS VISIT:  ED Discharge Orders        Ordered    butalbital-acetaminophen-caffeine (FIORICET, ESGIC) 50-325-40 MG tablet  Every 6 hours PRN     08/07/17 1128       Note:  This document was prepared using Dragon voice recognition software and may include unintentional dictation errors.    Nita SickleVeronese, [place], MD 08/07/17 [phone]

## 2017-08-07 NOTE — ED Notes (Signed)
Report received from donald. Pt here for migraine and has been medicated with fluids running. Waiting for pt to feel better - will need reassessed for that.

## 2017-08-07 NOTE — ED Triage Notes (Signed)
Pt came to ED via EMS from home c/o migraine for 2-3 days. C/o frontal headache, blurry vision and feels like "the room is circling."

## 2017-08-07 NOTE — Discharge Instructions (Signed)
You have been seen in the Emergency Department (ED) for a headache. Your evaluation today was overall reassuring. Headaches have many possible causes. Most headaches aren't a sign of a more serious problem, and they will get better on their own.   Follow-up with your doctor in 12-24 hours if you are still having a headache. Otherwise follow up with your doctor in 3-5 days.  For pain take fioricet as prescribed. You may also take ibuprofen 600mg  every 6 hours  When should you call for help?  Call 911 or return to the ED anytime you think you may need emergency care. For example, call if:  You have signs of a stroke. These may include:  Sudden numbness, paralysis, or weakness in your face, arm, or leg, especially on only one side of your body.  Sudden vision changes.  Sudden trouble speaking.  Sudden confusion or trouble understanding simple statements.  Sudden problems with walking or balance.  A sudden, severe headache that is different from past headaches. You have new or worsening headache Nausea and vomiting associated with your headache Fever, neck stiffness associated with your headache  Call your doctor now or seek immediate medical care if:  You have a new or worse headache.  Your headache gets much worse.  How can you care for yourself at home?  Do not drive if you have taken a prescription pain medicine.  Rest in a quiet, dark room until your headache is gone. Close your eyes and try to relax or go to sleep. Don't watch TV or read.  Put a cold, moist cloth or cold pack on the painful area for 10 to 20 minutes at a time. Put a thin cloth between the cold pack and your skin.  Use a warm, moist towel or a heating pad set on low to relax tight shoulder and neck muscles.  Have someone gently massage your neck and shoulders.  Take pain medicines exactly as directed.  If the doctor gave you a prescription medicine for pain, take it as prescribed.  If you are not taking a prescription  pain medicine, ask your doctor if you can take an over-the-counter medicine. Be careful not to take pain medicine more often than the instructions allow, because you may get worse or more frequent headaches when the medicine wears off.  Do not ignore new symptoms that occur with a headache, such as a fever, weakness or numbness, vision changes, or confusion. These may be signs of a more serious problem.  To prevent headaches  Keep a headache diary so you can figure out what triggers your headaches. Avoiding triggers may help you prevent headaches. Record when each headache began, how long it lasted, and what the pain was like (throbbing, aching, stabbing, or dull). Write down any other symptoms you had with the headache, such as nausea, flashing lights or dark spots, or sensitivity to bright light or loud noise. Note if the headache occurred near your period. List anything that might have triggered the headache, such as certain foods (chocolate, cheese, wine) or odors, smoke, bright light, stress, or lack of sleep.  Find healthy ways to deal with stress. Headaches are most common during or right after stressful times. Take time to relax before and after you do something that has caused a headache in the past.  Try to keep your muscles relaxed by keeping good posture. Check your jaw, face, neck, and shoulder muscles for tension, and try relaxing them. When sitting at a desk, change  positions often, and stretch for 30 seconds each hour.  Get plenty of sleep and exercise.  Eat regularly and well. Long periods without food can trigger a headache.  Treat yourself to a massage. Some people find that regular massages are very helpful in relieving tension.  Limit caffeine by not drinking too much coffee, tea, or soda. But don't quit caffeine suddenly, because that can also give you headaches.  Reduce eyestrain from computers by blinking frequently and looking away from the computer screen every so often. Make  sure you have proper eyewear and that your monitor is set up properly, about an arm's length away.  Seek help if you have depression or anxiety. Your headaches may be linked to these conditions. Treatment can both prevent headaches and help with symptoms of anxiety or depression.

## 2017-11-17 ENCOUNTER — Ambulatory Visit: Payer: Self-pay | Attending: Oncology

## 2017-11-18 ENCOUNTER — Other Ambulatory Visit: Payer: Self-pay

## 2017-11-23 ENCOUNTER — Other Ambulatory Visit: Payer: Self-pay

## 2017-11-30 ENCOUNTER — Ambulatory Visit
Admission: RE | Admit: 2017-11-30 | Discharge: 2017-11-30 | Disposition: A | Discharge status: Home / Self Care | Payer: Self-pay | Source: Ambulatory Visit | Attending: Oncology | Admitting: Oncology

## 2017-11-30 DIAGNOSIS — N63 Unspecified lump in unspecified breast: Secondary | ICD-10-CM

## 2017-12-01 ENCOUNTER — Encounter: Payer: Self-pay | Admitting: *Deleted

## 2017-12-01 NOTE — Progress Notes (Signed)
Mailed letter to inform patient of her ultrasound results and need to return for her annual screening on 02/16/18 @ 9:00.  HSIS to Apollohristy.

## 2018-02-16 ENCOUNTER — Ambulatory Visit: Payer: Self-pay

## 2018-03-14 ENCOUNTER — Ambulatory Visit: Payer: Self-pay | Attending: Oncology

## 2019-06-08 ENCOUNTER — Telehealth: Payer: Self-pay

## 2019-06-08 NOTE — Telephone Encounter (Signed)
Pre-screening call attempted prior to [facility] appointment on 06/14/2019. No answer / message left.

## 2019-06-13 ENCOUNTER — Encounter: Payer: Self-pay | Admitting: *Deleted

## 2019-06-13 ENCOUNTER — Other Ambulatory Visit: Payer: Self-pay | Admitting: *Deleted

## 2019-06-13 ENCOUNTER — Other Ambulatory Visit: Payer: Self-pay

## 2019-06-13 DIAGNOSIS — Z0000 Encounter for general adult medical examination without abnormal findings: Secondary | ICD-10-CM

## 2019-06-13 NOTE — Progress Notes (Signed)
Called patient today to verbally consent her to our [employer] program.  2 patient identifiers were used to identify the patient.  Patient has had her health history taken via televisit.  At this time she does not have any breast problems and her next pap smear is due in 2022.  Her Tyrer-Cuzick breast risk assessment score is a 9.3% lifetime risk of breast cancer.  She is to go for her mammogram tomorrow at the [facility].  Will follow up per BCCCP protocol.

## 2019-06-14 ENCOUNTER — Ambulatory Visit
Admission: RE | Admit: 2019-06-14 | Discharge: 2019-06-14 | Disposition: A | Discharge status: Home / Self Care | Payer: Self-pay | Source: Ambulatory Visit | Attending: Oncology | Admitting: Oncology

## 2019-06-14 ENCOUNTER — Ambulatory Visit: Payer: Self-pay | Attending: Oncology

## 2019-06-14 DIAGNOSIS — Z0000 Encounter for general adult medical examination without abnormal findings: Secondary | ICD-10-CM

## 2019-06-14 DIAGNOSIS — Z1231 Encounter for screening mammogram for malignant neoplasm of breast: Secondary | ICD-10-CM | POA: Insufficient documentation

## 2019-06-27 ENCOUNTER — Encounter: Payer: Self-pay | Admitting: *Deleted

## 2019-07-03 ENCOUNTER — Encounter: Payer: Self-pay | Admitting: *Deleted

## 2019-07-03 NOTE — Progress Notes (Signed)
Letter mailed from the [facility] to inform patient of her normal mammogram results.  Patient is to follow-up with annual screening in one year.  HSIS to Christy. 

## 2019-12-04 ENCOUNTER — Ambulatory Visit: Payer: Self-pay | Attending: Internal Medicine

## 2019-12-04 DIAGNOSIS — Z23 Encounter for immunization: Secondary | ICD-10-CM

## 2019-12-04 NOTE — Progress Notes (Signed)
   Covid-19 Vaccination Clinic  Name:  Megan [name]    MRN: [id number] DOB: [birth year]  12/04/2019  [name] was observed post Covid-19 immunization for 15 minutes without incident. She was provided with Vaccine Information Sheet and instruction to access the V-Safe system.   [name] was instructed to call 911 with any severe reactions post vaccine: Marland Kitchen Difficulty breathing  . Swelling of face and throat  . A fast heartbeat  . A bad rash all over body  . Dizziness and weakness   Immunizations Administered    Name Date Dose VIS Date Route   Pfizer COVID-19 Vaccine 12/04/2019 11:08 AM 0.3 mL 08/11/2019 Intramuscular   Manufacturer: [employer], [employer]   Lot: [id number]   NDC: [address]-3

## 2019-12-26 ENCOUNTER — Ambulatory Visit: Payer: Self-pay | Attending: Internal Medicine

## 2019-12-26 DIAGNOSIS — Z23 Encounter for immunization: Secondary | ICD-10-CM

## 2019-12-26 NOTE — Progress Notes (Signed)
   Covid-19 Vaccination Clinic  Name:  Megan [name]    MRN: [id number] DOB: [birth year]  12/26/2019  Megan [name] was observed post Covid-19 immunization for 15 minutes without incident. She was provided with Vaccine Information Sheet and instruction to access the V-Safe system.   Megan [name] was instructed to call 911 with any severe reactions post vaccine: Marland Kitchen Difficulty breathing  . Swelling of face and throat  . A fast heartbeat  . A bad rash all over body  . Dizziness and weakness   Immunizations Administered    Name Date Dose VIS Date Route   Pfizer COVID-19 Vaccine 12/26/2019  3:35 PM 0.3 mL 10/25/2018 Intramuscular   Manufacturer: [employer], [employer]   Lot: [id number]   NDC: [address]-7

## 2020-01-06 ENCOUNTER — Ambulatory Visit: Payer: Self-pay

## 2020-08-31 DIAGNOSIS — U071 COVID-19: Secondary | ICD-10-CM | POA: Insufficient documentation

## 2020-08-31 HISTORY — DX: COVID-19: U07.1

## 2020-09-27 ENCOUNTER — Other Ambulatory Visit: Payer: Self-pay

## 2020-09-27 DIAGNOSIS — Z20822 Contact with and (suspected) exposure to covid-19: Secondary | ICD-10-CM

## 2020-09-28 LAB — SARS-COV-2, NAA 2 DAY TAT

## 2020-09-28 LAB — NOVEL CORONAVIRUS, NAA: SARS-CoV-2, NAA: DETECTED — AB

## 2020-09-29 ENCOUNTER — Other Ambulatory Visit: Payer: Self-pay | Admitting: Nurse Practitioner

## 2020-09-29 ENCOUNTER — Encounter: Payer: Self-pay | Admitting: Nurse Practitioner

## 2020-09-29 DIAGNOSIS — E039 Hypothyroidism, unspecified: Secondary | ICD-10-CM | POA: Insufficient documentation

## 2020-09-29 DIAGNOSIS — J45909 Unspecified asthma, uncomplicated: Secondary | ICD-10-CM | POA: Insufficient documentation

## 2020-09-29 DIAGNOSIS — J4520 Mild intermittent asthma, uncomplicated: Secondary | ICD-10-CM

## 2020-09-29 DIAGNOSIS — U071 COVID-19: Secondary | ICD-10-CM

## 2020-09-29 NOTE — Progress Notes (Signed)
I connected by phone with Megan [name] on 09/29/2020 at 5:07 PM to discuss the potential use of a new treatment for mild to moderate COVID-19 viral infection in non-hospitalized patients.  This patient is a 50 y.o. female that meets the FDA criteria for Emergency Use Authorization of COVID monoclonal antibody sotrovimab.  Has a (+) direct SARS-CoV-2 viral test result  Has mild or moderate COVID-19   Is NOT hospitalized due to COVID-19  Is within 10 days of symptom onset  Has at least one of the high risk factor(s) for progression to severe COVID-19 and/or hospitalization as defined in EUA.  Specific high risk criteria : BMI > 25 and Chronic Lung Disease   I have spoken and communicated the following to the patient or parent/caregiver regarding COVID monoclonal antibody treatment:  1. FDA has authorized the emergency use for the treatment of mild to moderate COVID-19 in adults and pediatric patients with positive results of direct SARS-CoV-2 viral testing who are 22 years of age and older weighing at least 40 kg, and who are at high risk for progressing to severe COVID-19 and/or hospitalization.  2. The significant known and potential risks and benefits of COVID monoclonal antibody, and the extent to which such potential risks and benefits are unknown.  3. Information on available alternative treatments and the risks and benefits of those alternatives, including clinical trials.  4. Patients treated with COVID monoclonal antibody should continue to self-isolate and use infection control measures (e.g., wear mask, isolate, social distance, avoid sharing personal items, clean and disinfect "high touch" surfaces, and frequent handwashing) according to CDC guidelines.   5. The patient or parent/caregiver has the option to accept or refuse COVID monoclonal antibody treatment.  After reviewing this information with the patient, the patient has agreed to receive one of the available covid 19  monoclonal antibodies and will be provided an appropriate fact sheet prior to infusion. [name], NP 09/29/2020 5:07 PM

## 2020-10-08 NOTE — Progress Notes (Signed)
Patient pre-screened for BCCCP eligibility. Two patient identifiers used for verification that I was speaking to correct patient.  Patient to Present directly to [facility] at 10:30 on 10/10/19 for breast exam, and pap.

## 2020-10-09 ENCOUNTER — Ambulatory Visit: Payer: Self-pay | Attending: Oncology | Admitting: *Deleted

## 2020-10-09 ENCOUNTER — Encounter: Payer: Self-pay | Admitting: *Deleted

## 2020-10-09 ENCOUNTER — Ambulatory Visit
Admission: RE | Admit: 2020-10-09 | Discharge: 2020-10-09 | Disposition: A | Discharge status: Home / Self Care | Payer: Self-pay | Source: Ambulatory Visit | Attending: Oncology | Admitting: Oncology

## 2020-10-09 ENCOUNTER — Other Ambulatory Visit: Payer: Self-pay

## 2020-10-09 VITALS — BP 132/93 | HR 87 | Temp 98.0°F | Ht 66.14 in | Wt 218.3 lb

## 2020-10-09 DIAGNOSIS — Z0000 Encounter for general adult medical examination without abnormal findings: Secondary | ICD-10-CM | POA: Insufficient documentation

## 2020-10-09 NOTE — Patient Instructions (Signed)
Gave patient hand-out, Women Staying Healthy, Active and Well from [facility], with education on breast health, pap smears, heart and colon health. 

## 2020-10-09 NOTE — Progress Notes (Signed)
Subjective:     Patient ID: Megan [name], female   DOB: [birth year], 50 y.o.   MRN: [id number]  HPI   B[facility]CP Medical History Record - 10/08/20 1519      Breast History   Screening cycle New    Provider ([facility]) [facility]    Initial Mammogram 10/09/20    Last Mammogram Annual    Last Mammogram Date 06/14/19    Provider (Mammogram)  [facility]    Recent Breast Symptoms None      Breast Cancer History   Breast Cancer History Patient and mother/daughter/sister have had breast cancer    Comments/Details Maternal grandmother had breast cancer      Previous History of Breast Problems   Breast Surgery or Biopsy None    Breast Implants N/A    BSE Done Monthly      Gynecological/Obstetrical History   LMP 10/01/20    Is there any chance that the client could be pregnant?  No    Age at menarche 55    Age at menopause n/a    PAP smear history Annually    Date of last PAP  11/04/15    Provider (PAP) Dr. Illene Regulus    Age at first live birth 42    Breast fed children No    DES Exposure No    Cervical, Uterine or Ovarian cancer No    Family history of Cervial, Uterine or Ovarian cancer No    Hysterectomy No    Cervix removed No    Ovaries removed No    Laser/Cryosurgery No    Current method of birth control Other (see comments)   tubal Ligation   Current method of Estrogen/Hormone replacement None    Smoking history None            Review of Systems     Objective:   Physical Exam Chest:  Breasts:     Right: No swelling, bleeding, inverted nipple, mass, nipple discharge, skin change, tenderness, axillary adenopathy or supraclavicular adenopathy.     Left: No bleeding, inverted nipple, mass, nipple discharge, skin change, tenderness, axillary adenopathy or supraclavicular adenopathy.     Abdominal:     Palpations: There is no hepatomegaly or splenomegaly.  Genitourinary:    Exam position: Lithotomy position.     Labia:        Right: No rash, tenderness, lesion or  injury.        Left: No rash, tenderness, lesion or injury.      Urethra: No prolapse.     Vagina: No signs of injury and foreign body. Bleeding and lesions present. No vaginal discharge, erythema, tenderness or prolapsed vaginal walls.     Cervix: No cervical motion tenderness, discharge or friability.     Uterus: Not deviated.      Adnexa:        Right: No mass.         Left: No mass.       Comments: Dark red blood noted on exam Lymphadenopathy:     Upper Body:     Right upper body: No supraclavicular or axillary adenopathy.     Left upper body: No supraclavicular or axillary adenopathy.        Assessment:     50 year old Black female returns for annual exam.  Clinical breast exam unremarkable.  Taught self breast awareness.  Specimen collected for pap smear with some difficulty.  Patient has just finished her menstrual cycle and there is  a large amount of dark red blood obscuring the cervical os.  Not sure if this will be a good specimen.  Explained to patient that sometimes due to the excess blood not enough cells are collected and that she may have to return for repeat exam in about 3 months.  She is agreeable.  Patient has been screened for eligibility.  She does not have any insurance, Medicare or Medicaid.  She also meets financial eligibility.   Risk Assessment    Risk Scores      10/09/2020 06/09/2019   Last edited by: Scarlett Presto, RN [place], Freada Bergeron, CMA   5-year risk: 1 % 0.9 %   Lifetime risk: 9 % 9.2 %            Plan:     Screening mammmogram ordered.  Specimen for pap sent to the lab.  Will follow up per BCCCP protocol.

## 2020-10-11 ENCOUNTER — Other Ambulatory Visit: Payer: Self-pay

## 2020-10-11 DIAGNOSIS — N63 Unspecified lump in unspecified breast: Secondary | ICD-10-CM

## 2020-10-12 LAB — IGP, APTIMA HPV: HPV Aptima: NEGATIVE

## 2020-10-24 ENCOUNTER — Other Ambulatory Visit: Payer: Self-pay

## 2020-10-24 ENCOUNTER — Ambulatory Visit
Admission: RE | Admit: 2020-10-24 | Discharge: 2020-10-24 | Disposition: A | Discharge status: Home / Self Care | Payer: Self-pay | Source: Ambulatory Visit | Attending: Oncology | Admitting: Oncology

## 2020-10-24 DIAGNOSIS — N63 Unspecified lump in unspecified breast: Secondary | ICD-10-CM | POA: Insufficient documentation

## 2020-10-29 ENCOUNTER — Encounter: Payer: Self-pay | Admitting: *Deleted

## 2020-10-29 ENCOUNTER — Other Ambulatory Visit: Payer: Self-pay | Admitting: *Deleted

## 2020-10-29 ENCOUNTER — Telehealth: Payer: Self-pay | Admitting: *Deleted

## 2020-10-29 DIAGNOSIS — N63 Unspecified lump in unspecified breast: Secondary | ICD-10-CM

## 2020-10-29 NOTE — Progress Notes (Signed)
Received message from Megan [name] that patient wanted to speak to me.  She has a birads 4 mammogram. Left patient a message to return my call.

## 2020-10-29 NOTE — Telephone Encounter (Signed)
VM TO PT TO SCHEDULE BREAST BIOPSY

## 2020-11-11 ENCOUNTER — Ambulatory Visit
Admission: RE | Admit: 2020-11-11 | Discharge: 2020-11-11 | Disposition: A | Discharge status: Home / Self Care | Payer: Self-pay | Source: Ambulatory Visit | Attending: Oncology | Admitting: Oncology

## 2020-11-11 ENCOUNTER — Other Ambulatory Visit: Payer: Self-pay

## 2020-11-11 DIAGNOSIS — N63 Unspecified lump in unspecified breast: Secondary | ICD-10-CM | POA: Insufficient documentation

## 2020-11-11 HISTORY — PX: BREAST BIOPSY: SHX20

## 2020-11-12 ENCOUNTER — Encounter: Payer: Self-pay | Admitting: *Deleted

## 2020-11-12 LAB — SURGICAL PATHOLOGY

## 2020-11-12 NOTE — Progress Notes (Signed)
Called and left patient a message to return my call.  She had a benign breast biopsy, but radiology has recommended further evaluation of the cervical and axillary lymphadenopathy she is having.  I also received a call from Dr. Orlie Dakin that he had gotten a called report from radiology, and he requested that I refer patient to medical oncology even if path results were benign.  Will discuss with patient when she returns my call.

## 2020-11-14 ENCOUNTER — Other Ambulatory Visit: Payer: Self-pay | Admitting: *Deleted

## 2020-11-14 ENCOUNTER — Encounter: Payer: Self-pay | Admitting: *Deleted

## 2020-11-14 DIAGNOSIS — R591 Generalized enlarged lymph nodes: Secondary | ICD-10-CM

## 2020-11-14 NOTE — Progress Notes (Addendum)
Called patient and left her a message to return my call.  I would like to discuss a med/onc referral from her lymphadenopathy.  Patient returned my call.  We discussed her benign biopsy results but need to see a medical oncologist for her lymphadenopathy.  She understands BCCCP cannot pay for the consult or other procedures related to the lymphadenopathy.  We will pay for the consult with [employer] funds.  Patient states she is supposed to have another breast biopsy.  I have sent Randa Lynn, RN and Collene Mares, RN at [facility] radiology a message to confirm if this is needed, since I do not see that information in her chart.  Patient is aware that I will try and clarify that information.  Referral sent to medical oncology.

## 2020-11-18 ENCOUNTER — Other Ambulatory Visit: Payer: Self-pay | Admitting: *Deleted

## 2020-11-18 DIAGNOSIS — N63 Unspecified lump in unspecified breast: Secondary | ICD-10-CM

## 2020-11-19 ENCOUNTER — Other Ambulatory Visit: Payer: Self-pay | Admitting: *Deleted

## 2020-11-19 DIAGNOSIS — N63 Unspecified lump in unspecified breast: Secondary | ICD-10-CM

## 2020-12-02 ENCOUNTER — Ambulatory Visit: Payer: Self-pay

## 2020-12-02 ENCOUNTER — Other Ambulatory Visit: Payer: Self-pay

## 2020-12-03 ENCOUNTER — Ambulatory Visit
Admission: RE | Admit: 2020-12-03 | Discharge: 2020-12-03 | Disposition: A | Discharge status: Home / Self Care | Payer: Self-pay | Source: Ambulatory Visit | Attending: Oncology | Admitting: Oncology

## 2020-12-03 ENCOUNTER — Other Ambulatory Visit: Payer: Self-pay

## 2020-12-03 DIAGNOSIS — N63 Unspecified lump in unspecified breast: Secondary | ICD-10-CM

## 2020-12-03 HISTORY — PX: BREAST BIOPSY: SHX20

## 2020-12-04 ENCOUNTER — Encounter: Payer: Self-pay | Admitting: *Deleted

## 2020-12-04 LAB — SURGICAL PATHOLOGY

## 2020-12-04 NOTE — Progress Notes (Signed)
Patients additional biopsies are benign.  Radiology recommends surgical consult for excision of the first biopsy and med/onc consult.  Tried to call patient, but no answer and no voicemail.  Will try again next week, since I will be out of the office until Tuesday.

## 2020-12-10 ENCOUNTER — Encounter: Payer: Self-pay | Admitting: *Deleted

## 2020-12-10 NOTE — Progress Notes (Signed)
Called patient and discussed benign breast biopsies, but need for surgical consultation.  She would like to see Dr. Lemar Livings.  Reviewed that since she also needs a possible oncology consult for the lymphadenopathy, I would go ahead and let Dr. Lemar Livings look into that also in case he would recommend a biopsy.   Dr. Lemar Livings can refer to medical oncology if recommended.

## 2020-12-17 ENCOUNTER — Encounter: Payer: Self-pay | Admitting: *Deleted

## 2020-12-17 NOTE — Progress Notes (Signed)
Confirmed with Dr. Rutherford Nail office that patient does have an appointment with him on 12/26/20.

## 2020-12-26 ENCOUNTER — Other Ambulatory Visit: Payer: Self-pay | Admitting: General Surgery

## 2020-12-26 DIAGNOSIS — N6320 Unspecified lump in the left breast, unspecified quadrant: Secondary | ICD-10-CM

## 2020-12-26 DIAGNOSIS — R928 Other abnormal and inconclusive findings on diagnostic imaging of breast: Secondary | ICD-10-CM

## 2020-12-27 ENCOUNTER — Encounter: Payer: Self-pay | Admitting: *Deleted

## 2020-12-27 NOTE — Progress Notes (Signed)
Patient left me a message to return her call.  Left message for patient to call me back.

## 2020-12-30 ENCOUNTER — Telehealth: Payer: Self-pay | Admitting: *Deleted

## 2020-12-30 NOTE — Telephone Encounter (Signed)
Per Dr. Pete Glatter - she & Dr. Lemar Livings discussed scheduling another biopsy for "left breast mass @ 2:00". Received signed orders and called the pt to schedule biopsy.  Pt stated she was given multiple options of plans and she has yet to decide how she would like to proceed at this point.

## 2021-01-07 ENCOUNTER — Other Ambulatory Visit: Payer: Self-pay | Admitting: General Surgery

## 2021-01-07 DIAGNOSIS — R928 Other abnormal and inconclusive findings on diagnostic imaging of breast: Secondary | ICD-10-CM

## 2021-01-07 NOTE — Progress Notes (Signed)
Subjective:     Patient ID: Megan [name] is a 50 y.o. female.  HPI  The following portions of the patient's history were reviewed and updated as appropriate.  This a new patient is here today for: office visit. She is here for evaluation of a bilateral breast biopsy on 12-03-20 referred by Dr Orlie Dakin. She states surgery is needed for further evaluation. She did notice that the left breast was uncomfortable when she would lay on her stomach, otherwise she could not feel a mass. She denies any nipple discharge or trauma.  Review of Systems  Constitutional: Negative for chills and fever.  Respiratory: Negative for cough.        Chief Complaint  Patient presents with  . Breast Problem     BP 118/76   Pulse 88   Temp 36.2 C (97.2 F)   Ht 165.1 cm (5\' 5" )   Wt (!) 102.5 kg (226 lb)   LMP 12/01/2020   SpO2 97%   BMI 37.61 kg/m       Past Medical History:  Diagnosis Date  . Asthma   . Hypothyroidism   . Morbid obesity (CMS-HCC)           Past Surgical History:  Procedure Laterality Date  . BREAST EXCISIONAL BIOPSY Left 12/03/2020   x 2  . BREAST EXCISIONAL BIOPSY Right 11/11/2020  . BREAST EXCISIONAL BIOPSY Left 11/11/2020  . CESAREAN SECTION    . LAPAROSCOPIC TUBAL LIGATION                OB History    Gravida  2   Para  2   Term      Preterm      AB      Living        SAB      IAB      Ectopic      Molar      Multiple      Live Births          Obstetric Comments  Age at first period 40 Age of first pregnancy 77  Daughter passed at age 31        Social History         Socioeconomic History  . Marital status: Single  Tobacco Use  . Smoking status: Never Smoker  . Smokeless tobacco: Never Used  Substance and Sexual Activity  . Alcohol use: Never  . Drug use: Never       No Known Allergies  Current Medications        Current Outpatient Medications  Medication Sig Dispense Refill  .  albuterol sulfate 90 mcg/actuation aebs Inhale 180 mcg into the lungs every 4 (four) hours    . levothyroxine (SYNTHROID) 88 MCG tablet Take 88 mcg by mouth once daily Take on an empty stomach with a glass of water at least 30-60 minutes before breakfast.    . PARoxetine (PAXIL) 10 MG tablet Take 10 mg by mouth once daily    . traZODone (DESYREL) 100 MG tablet Take 100 mg by mouth nightly     No current facility-administered medications for this visit.           Family History  Problem Relation Age of Onset  . Diabetes Mother   . Breast cancer Maternal Grandmother   . Colon cancer Neg Hx            Objective:   Physical Exam Exam conducted with a chaperone present.  Constitutional:  Appearance: Normal appearance.  Cardiovascular:     Rate and Rhythm: Normal rate and regular rhythm.     Pulses: Normal pulses.     Heart sounds: Normal heart sounds.  Pulmonary:     Effort: Pulmonary effort is normal.     Breath sounds: Normal breath sounds.  Musculoskeletal:     Cervical back: Neck supple.  Skin:    General: Skin is warm and dry.  Neurological:     Mental Status: She is alert and oriented to person, place, and time.  Psychiatric:        Mood and Affect: Mood normal.        Behavior: Behavior normal.    Labs and Radiology:   February 10, 2017 through December 03, 2020 mammogram and ultrasound studies were independently reviewed.  Laboratory review:  December 03, 2020:  A. BREAST, LEFT, 2:00; ULTRASOUND-GUIDED CORE BIOPSY:  - FIBROADENOMA.   B. BREAST, LEFT, 5:00; ULTRASOUND-GUIDED CORE BIOPSY:  - FIBROADENOMA.   November 11, 2020:  . BREAST, RIGHT AT 8:00, 7 CM FROM THE NIPPLE; ULTRASOUND-GUIDED CORE  NEEDLE BIOPSY:  - FRAGMENTS OF BENIGN INTRAMAMMARY LYMPH NODE.  - NEGATIVE FOR ATYPICAL PROLIFERATIVE BREAST DISEASE.   B. BREAST, LEFT AT 2:00, 6 CM FROM THE NIPPLE; ULTRASOUND-GUIDED CORE  NEEDLE BIOPSY:    - FIBROEPITHELIAL  PROLIFERATION WITH SCLEROSIS.  - NEGATIVE FOR ATYPICAL PROLIFERATIVE BREAST DISEASE IN THIS SAMPLE.   Comment:  Diagnostic considerations include (but are not limited to) a complex  fibroadenoma, intraductal papilloma with sclerosis, or complex  sclerosing lesion. Although atypia is not present in this biopsy, it  cannot be excluded elsewhere in this lesion. Correlation with  radiographic findings is required.  Limited ultrasound exam:  Examination of the upper outer quadrant of the left breast was undertaken to determine if the most superior biopsy site was clearly distinct from the additional biopsy sites.  This was not the case.  No images.  Should she desire formal excision, a wire localization will be appropriate.     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  Multiple benign fibroadenomas.  Lymphadenopathy without clear pathology.    Plan:     Options for management of the left breast 2:00 lesion 6 cm from the nipple reviewed: 1) surgical excision with wire localization; 2) vacuum biopsy with a larger sample (procedure discussed with Dr. Durel Salts from mammography) versus 3) observation with 6 to 23-month follow-up exams.  The patient is recently engaged and I do not think that observation is a high on her list.  She is anxious to avoid a scar.  I would suspect that with a maximum 1 cm lesion and a 9 gauge vacuum biopsy device this could be completely removed including the previously placed biopsy clip and put to rest any concerns for a more significant proliferative lesion such as a benign [name] tumor or radial scar.  The patient will consider her options and notify the office of how she would like to proceed.  She was encouraged to discuss with her primary care provider further evaluation of her lymphadenopathy.     This note is partially prepared by Dorathy Daft, RN, acting as a scribe in the presence of Dr. Donnalee Curry, MD.   The documentation recorded by the scribe accurately reflects the service I personally performed and the decisions made by me.   [name], MD FACS    The patient decided on formal excision  rather than large bore vacuum biopsy. JWB

## 2021-01-28 ENCOUNTER — Encounter
Admission: RE | Admit: 2021-01-28 | Discharge: 2021-01-28 | Disposition: A | Discharge status: Home / Self Care | Payer: Self-pay | Source: Ambulatory Visit | Attending: General Surgery | Admitting: General Surgery

## 2021-01-28 ENCOUNTER — Other Ambulatory Visit: Payer: Self-pay

## 2021-01-28 HISTORY — DX: Anxiety disorder, unspecified: F41.9

## 2021-01-28 NOTE — Patient Instructions (Signed)
Your procedure is scheduled on: 02/03/21 Report to THE ADMITTING DESK IN THE MEDICAL MALL ENTRANCE. ARRIVE AT PRE ARRANGED TIME (7:45 am)  Remember: Instructions that are not followed completely may result in serious medical risk, up to and including death, or upon the discretion of your surgeon and anesthesiologist your surgery may need to be rescheduled.     _X__ 1. Do not eat food or drink any liquids after midnight the night before your procedure.                 No gum chewing or hard candies.   __X__2.  On the morning of surgery brush your teeth with toothpaste and water, you                 may rinse your mouth with mouthwash if you wish.  Do not swallow any              toothpaste of mouthwash.     _X__ 3.  No Alcohol for 24 hours before or after surgery.   _X__ 4.  Do Not Smoke or use e-cigarettes For 24 Hours Prior to Your Surgery.                 Do not use any chewable tobacco products for at least 6 hours prior to                 surgery.  ____  5.  Bring all medications with you on the day of surgery if instructed.   __X__  6.  Notify your doctor if there is any change in your medical condition      (cold, fever, infections).     Do not wear jewelry, make-up, hairpins, clips or nail polish. Do not wear lotions, powders, or perfumes or deodorant.  Do not shave 48 hours prior to surgery. Men may shave face and neck. Do not bring valuables to the hospital.    [facility] is not responsible for any belongings or valuables.  Contacts, dentures/partials or body piercings may not be worn into surgery. Bring a case for your contacts, glasses or hearing aids, a denture cup will be supplied. Leave your suitcase in the car. After surgery it may be brought to your room. For patients admitted to the hospital, discharge time is determined by your treatment team.   Patients discharged the day of surgery will not be allowed to drive home.   Please read over the following fact sheets  that you were given:   CHG SOAP  __X__ Take these medicines the morning of surgery with A SIP OF WATER:    1. Levothyroxine Sodium 88 MCG CAPS  2. PARoxetine (PAXIL) 20 MG tablet  3. loratadine (CLARITIN) 10 MG tablet IF NEEDED  4.  5.  6.  ____ Fleet Enema (as directed)   __X__ Use CHG Soap/SAGE wipes as directed  ____ Use inhalers on the day of surgery  ____ Stop metformin/Janumet/Farxiga 2 days prior to surgery    ____ Take 1/2 of usual insulin dose the night before surgery. No insulin the morning          of surgery.   ____ Stop Blood Thinners Coumadin/Plavix/Xarelto/Pleta/Pradaxa/Eliquis/Effient/Aspirin  on   Or contact your Surgeon, Cardiologist or Medical Doctor regarding  ability to stop your blood thinners  __X__ Stop Anti-inflammatories 7 days before surgery such as Advil, Ibuprofen, Motrin,  BC or Goodies Powder, Naprosyn, Naproxen, Aleve, Aspirin    __X__ Stop all herbal supplements,  fish oil or vitamin E until after surgery.    ____ Bring C-Pap to the hospital.

## 2021-02-03 ENCOUNTER — Ambulatory Visit
Admission: RE | Admit: 2021-02-03 | Discharge: 2021-02-03 | Disposition: A | Discharge status: Home / Self Care | Payer: Self-pay | Source: Ambulatory Visit | Attending: General Surgery | Admitting: General Surgery

## 2021-02-03 ENCOUNTER — Encounter
Admission: RE | Disposition: A | Discharge status: Home / Self Care | Payer: Self-pay | Source: Home / Self Care | Attending: General Surgery

## 2021-02-03 ENCOUNTER — Other Ambulatory Visit: Payer: Self-pay

## 2021-02-03 ENCOUNTER — Ambulatory Visit
Admission: RE | Admit: 2021-02-03 | Discharge: 2021-02-03 | Disposition: A | Discharge status: Home / Self Care | Payer: Self-pay | Attending: General Surgery | Admitting: General Surgery

## 2021-02-03 ENCOUNTER — Ambulatory Visit: Payer: Self-pay | Admitting: Registered Nurse

## 2021-02-03 ENCOUNTER — Encounter: Payer: Self-pay | Admitting: General Surgery

## 2021-02-03 DIAGNOSIS — R928 Other abnormal and inconclusive findings on diagnostic imaging of breast: Secondary | ICD-10-CM

## 2021-02-03 HISTORY — PX: BREAST LUMPECTOMY: SHX2

## 2021-02-03 HISTORY — PX: BREAST BIOPSY: SHX20

## 2021-02-03 LAB — POCT PREGNANCY, URINE: Preg Test, Ur: NEGATIVE

## 2021-02-03 SURGERY — BREAST BIOPSY WITH NEEDLE LOCALIZATION
Anesthesia: General | Site: Breast | Laterality: Left

## 2021-02-03 MED ORDER — EPHEDRINE 5 MG/ML INJ
INTRAVENOUS | Status: AC
  Filled 2021-02-03: qty 10

## 2021-02-03 MED ORDER — FAMOTIDINE 20 MG PO TABS
ORAL_TABLET | ORAL | Status: AC
  Administered 2021-02-03: 20 mg via ORAL
  Filled 2021-02-03: qty 1

## 2021-02-03 MED ORDER — FENTANYL CITRATE (PF) 100 MCG/2ML IJ SOLN
INTRAMUSCULAR | Status: DC | PRN
  Administered 2021-02-03 (×3): 25 ug via INTRAVENOUS

## 2021-02-03 MED ORDER — FENTANYL CITRATE (PF) 100 MCG/2ML IJ SOLN
25.0000 ug | INTRAMUSCULAR | Status: DC | PRN
Start: 2021-02-03 — End: 2021-02-03

## 2021-02-03 MED ORDER — TRAMADOL HCL 50 MG PO TABS: 50.0000 mg | ORAL_TABLET | ORAL | 0 refills | Status: DC | PRN

## 2021-02-03 MED ORDER — MIDAZOLAM HCL 2 MG/2ML IJ SOLN
INTRAMUSCULAR | Status: DC | PRN
  Administered 2021-02-03: 2 mg via INTRAVENOUS

## 2021-02-03 MED ORDER — PROPOFOL 10 MG/ML IV BOLUS
INTRAVENOUS | Status: DC | PRN
  Administered 2021-02-03: 150 mg via INTRAVENOUS

## 2021-02-03 MED ORDER — ORAL CARE MOUTH RINSE: 15.0000 mL | Freq: Once | OROMUCOSAL | Status: AC

## 2021-02-03 MED ORDER — ACETAMINOPHEN 10 MG/ML IV SOLN
INTRAVENOUS | Status: AC
  Filled 2021-02-03: qty 100

## 2021-02-03 MED ORDER — PHENYLEPHRINE HCL (PRESSORS) 10 MG/ML IV SOLN
INTRAVENOUS | Status: DC | PRN
  Administered 2021-02-03 (×2): 100 ug via INTRAVENOUS

## 2021-02-03 MED ORDER — CHLORHEXIDINE GLUCONATE CLOTH 2 % EX PADS: 6.0000 | MEDICATED_PAD | Freq: Once | CUTANEOUS | Status: DC

## 2021-02-03 MED ORDER — KETAMINE HCL 50 MG/ML IJ SOLN
INTRAMUSCULAR | Status: AC
  Filled 2021-02-03: qty 1

## 2021-02-03 MED ORDER — ONDANSETRON HCL 4 MG/2ML IJ SOLN: 4.0000 mg | Freq: Once | INTRAMUSCULAR | Status: DC | PRN

## 2021-02-03 MED ORDER — DEXAMETHASONE SODIUM PHOSPHATE 10 MG/ML IJ SOLN
INTRAMUSCULAR | Status: DC | PRN
  Administered 2021-02-03: 10 mg via INTRAVENOUS

## 2021-02-03 MED ORDER — ONDANSETRON HCL 4 MG/2ML IJ SOLN
INTRAMUSCULAR | Status: AC
  Filled 2021-02-03: qty 2

## 2021-02-03 MED ORDER — LIDOCAINE HCL (CARDIAC) PF 100 MG/5ML IV SOSY
PREFILLED_SYRINGE | INTRAVENOUS | Status: DC | PRN
  Administered 2021-02-03: 100 mg via INTRAVENOUS

## 2021-02-03 MED ORDER — LIDOCAINE HCL (PF) 2 % IJ SOLN
INTRAMUSCULAR | Status: AC
  Filled 2021-02-03: qty 5

## 2021-02-03 MED ORDER — KETAMINE HCL 50 MG/ML IJ SOLN
INTRAMUSCULAR | Status: DC | PRN
  Administered 2021-02-03: 50 mg via INTRAMUSCULAR

## 2021-02-03 MED ORDER — LACTATED RINGERS IV SOLN: INTRAVENOUS | Status: DC

## 2021-02-03 MED ORDER — FAMOTIDINE 20 MG PO TABS: 20.0000 mg | ORAL_TABLET | Freq: Once | ORAL | Status: AC

## 2021-02-03 MED ORDER — ONDANSETRON HCL 4 MG/2ML IJ SOLN
INTRAMUSCULAR | Status: DC | PRN
  Administered 2021-02-03: 4 mg via INTRAVENOUS

## 2021-02-03 MED ORDER — CHLORHEXIDINE GLUCONATE 0.12 % MT SOLN
OROMUCOSAL | Status: AC
  Administered 2021-02-03: 15 mL via OROMUCOSAL
  Filled 2021-02-03: qty 15

## 2021-02-03 MED ORDER — FENTANYL CITRATE (PF) 100 MCG/2ML IJ SOLN
INTRAMUSCULAR | Status: AC
  Filled 2021-02-03: qty 2

## 2021-02-03 MED ORDER — EPHEDRINE SULFATE 50 MG/ML IJ SOLN
INTRAMUSCULAR | Status: DC | PRN
  Administered 2021-02-03: 10 mg via INTRAVENOUS
  Administered 2021-02-03: 5 mg via INTRAVENOUS

## 2021-02-03 MED ORDER — CHLORHEXIDINE GLUCONATE 0.12 % MT SOLN: 15.0000 mL | Freq: Once | OROMUCOSAL | Status: AC

## 2021-02-03 MED ORDER — PROPOFOL 10 MG/ML IV BOLUS
INTRAVENOUS | Status: AC
  Filled 2021-02-03: qty 20

## 2021-02-03 MED ORDER — ACETAMINOPHEN 10 MG/ML IV SOLN
INTRAVENOUS | Status: DC | PRN
  Administered 2021-02-03: 1000 mg via INTRAVENOUS

## 2021-02-03 MED ORDER — BUPIVACAINE-EPINEPHRINE (PF) 0.5% -1:200000 IJ SOLN
INTRAMUSCULAR | Status: DC | PRN
  Administered 2021-02-03: 20 mL
  Administered 2021-02-03: 10 mL

## 2021-02-03 MED ORDER — MIDAZOLAM HCL 2 MG/2ML IJ SOLN
INTRAMUSCULAR | Status: AC
  Filled 2021-02-03: qty 2

## 2021-02-03 SURGICAL SUPPLY — 42 items
APL PRP STRL LF DISP 70% ISPRP (MISCELLANEOUS) ×2
BLADE BOVIE TIP EXT 4 (BLADE) IMPLANT
BLADE SURG 15 STRL SS SAFETY (BLADE) ×4 IMPLANT
CANISTER SUCT 1200ML W/VALVE (MISCELLANEOUS) ×2 IMPLANT
CHLORAPREP W/TINT 26 (MISCELLANEOUS) ×4 IMPLANT
CNTNR SPEC 2.5X3XGRAD LEK (MISCELLANEOUS)
CONT SPEC 4OZ STER OR WHT (MISCELLANEOUS)
CONT SPEC 4OZ STRL OR WHT (MISCELLANEOUS)
CONTAINER SPEC 2.5X3XGRAD LEK (MISCELLANEOUS) IMPLANT
COVER PROBE FLX POLY STRL (MISCELLANEOUS) ×2 IMPLANT
COVER WAND RF STERILE (DRAPES) ×2 IMPLANT
DEVICE DUBIN SPECIMEN MAMMOGRA (MISCELLANEOUS) ×2 IMPLANT
DRAPE LAPAROTOMY 100X77 ABD (DRAPES) ×2 IMPLANT
DRSG GAUZE FLUFF 36X18 (GAUZE/BANDAGES/DRESSINGS) ×2 IMPLANT
DRSG TELFA 4X3 1S NADH ST (GAUZE/BANDAGES/DRESSINGS) ×2 IMPLANT
ELECT CAUTERY BLADE TIP 2.5 (TIP) ×2
ELECT REM PT RETURN 9FT ADLT (ELECTROSURGICAL) ×2
ELECTRODE CAUTERY BLDE TIP 2.5 (TIP) ×1 IMPLANT
ELECTRODE REM PT RTRN 9FT ADLT (ELECTROSURGICAL) ×1 IMPLANT
GLOVE SURG ENC MOIS LTX SZ7.5 (GLOVE) ×2 IMPLANT
GLOVE SURG UNDER LTX SZ8 (GLOVE) ×2 IMPLANT
GOWN STRL REUS W/ TWL LRG LVL3 (GOWN DISPOSABLE) ×2 IMPLANT
GOWN STRL REUS W/TWL LRG LVL3 (GOWN DISPOSABLE) ×4
KIT TURNOVER KIT A (KITS) ×2 IMPLANT
LABEL OR SOLS (LABEL) ×2 IMPLANT
MANIFOLD NEPTUNE II (INSTRUMENTS) ×2 IMPLANT
MARGIN MAP 10MM (MISCELLANEOUS) ×2 IMPLANT
NEEDLE HYPO 22GX1.5 SAFETY (NEEDLE) ×2 IMPLANT
NEEDLE HYPO 25X1 1.5 SAFETY (NEEDLE) IMPLANT
PACK BASIN MINOR ARMC (MISCELLANEOUS) ×2 IMPLANT
RETRACTOR RING XSMALL (MISCELLANEOUS) IMPLANT
RTRCTR WOUND ALEXIS 13CM XS SH (MISCELLANEOUS)
STRIP CLOSURE SKIN 1/2X4 (GAUZE/BANDAGES/DRESSINGS) ×2 IMPLANT
SUT ETHILON 3-0 FS-10 30 BLK (SUTURE) ×2
SUT VIC AB 2-0 CT1 27 (SUTURE) ×2
SUT VIC AB 2-0 CT1 TAPERPNT 27 (SUTURE) ×1 IMPLANT
SUT VIC AB 4-0 FS2 27 (SUTURE) ×2 IMPLANT
SUTURE EHLN 3-0 FS-10 30 BLK (SUTURE) ×1 IMPLANT
SWABSTK COMLB BENZOIN TINCTURE (MISCELLANEOUS) ×2 IMPLANT
SYR 10ML LL (SYRINGE) ×2 IMPLANT
TAPE TRANSPORE STRL 2 31045 (GAUZE/BANDAGES/DRESSINGS) ×2 IMPLANT
WATER STERILE IRR 1000ML POUR (IV SOLUTION) ×2 IMPLANT

## 2021-02-03 NOTE — Op Note (Addendum)
Preoperative diagnosis: Complex fibroadenoma left breast.  Postoperative diagnosis: Same.  Operative procedure: Left breast biopsy with wire localization and ultrasound guidance.  Operating surgeon: Donnalee Curry, MD.  Anesthesia: General by LMA, Marcaine 0.5% with 1: 200,000 units of epinephrine, 30 cc.  Estimated blood loss: 2 cc.  Clinical note: This 50 year old woman had a number of breast biopsies in March of this year including a biopsy of the 2 o'clock position of the left breast showed a complex fibroadenoma.  She had been recommended to have formal excision.  She underwent wire localization this morning with the wire positioned appropriately just in front of the index "Q" clip.  SCD stockings were placed for DVT prevention.  Antibiotics were not indicated.  Operative note: With the patient under adequate general anesthesia the area was cleansed with ChloraPrep and draped.  Care was taken to protect the localizing wire.  Ultrasound was used to identify the clip and wire in the deep portion of the breast in the upper outer quadrant.  Local anesthesia was infiltrated.  A curvilinear incision from the 1 to 3 o'clock position was made and carried down to skin subcutaneous tissue.  The breast parenchyma in this area had a sort of a unusual glandular appearance.  A block of tissue 2 x 2 x 2 cm was excised, orientated and specimen radiograph confirmed both the intact wire tip and the previously placed clips.  This was sent fresh to pathology per protocol.  The deep tissue was approximated with a single 2-0 Vicryl figure-of-eight suture.  The adipose layer with 2-0 Vicryl figure-of-eight sutures.  The skin was closed with a running 4-0 Vicryl subcuticular suture.  Benzoin, Steri-Strips, Telfa and Tegaderm dressing applied.  The patient tolerated the procedure well and was taken to the PACU in good condition.

## 2021-02-03 NOTE — H&P (Signed)
Megan [name] [id number] [birth year]     HPI:  Patient s/p multiple breast biopsies in the last few months. One suggested a complex fibro-epithelial lesion. For excisional biopsy.  She tolerated wire localization well.   Medications Prior to Admission  Medication Sig Dispense Refill Last Dose  . Levothyroxine Sodium 88 MCG CAPS Take 1 capsule by mouth daily before breakfast.   02/03/2021 at Unknown time  . loratadine (CLARITIN) 10 MG tablet Take 10 mg by mouth daily as needed for allergies.   Past Week at Unknown time  . PARoxetine (PAXIL) 20 MG tablet Take 20 mg by mouth daily.   Past Week at Unknown time  . traZODone (DESYREL) 50 MG tablet Take 50 mg by mouth at bedtime as needed for sleep.   Past Week at Unknown time   No Known Allergies Past Medical History:  Diagnosis Date  . Anxiety   . Asthma   . COVID-19 virus infection 08/2020  . Hypothyroidism   . Morbid obesity (HCC)    Past Surgical History:  Procedure Laterality Date  . BREAST BIOPSY Right 11/11/2020   [place] bx, vision marker, - FRAGMENTS OF BENIGN INTRAMAMMARY LYMPH NODE.  NEGATIVE FOR ATYPICAL PROLIFERATIVE BREAST DISEASE.  Marland Kitchen BREAST BIOPSY Left 11/11/2020`   [place] bx, Q shape, FIBROEPITHELIAL PROLIFERATION WITH SCLEROSIS. NEGATIVE FOR ATYPICAL PROLIFERATIVE BREAST DISEASE IN THIS SAMPLE.   Marland Kitchen BREAST BIOPSY Left 12/03/2020   [place] bx, vision clip, fiboadenoma  . BREAST BIOPSY Left 12/03/2020   [place] bx, x-clip, fibroadenoma  . BREAST LUMPECTOMY Left 02/03/2021   LT 2:00 X clip NL done prior to lumpectomy   . CESAREAN SECTION    . FOOT SURGERY     bone spur  . TUBAL LIGATION     Social History   Socioeconomic History  . Marital status: Single    Spouse name: Not on file  . Number of children: Not on file  . Years of education: Not on file  . Highest education level: Not on file  Occupational History  . Not on file  Tobacco Use  . Smoking status: Never Smoker  . Smokeless tobacco: Never Used  Vaping Use  . Vaping  Use: Never used  Substance and Sexual Activity  . Alcohol use: No  . Drug use: No  . Sexual activity: Yes    Birth control/protection: Surgical  Other Topics Concern  . Not on file  Social History Narrative  . Not on file   Social Determinants of Health   Financial Resource Strain: Not on file  Food Insecurity: Not on file  Transportation Needs: Not on file  Physical Activity: Not on file  Stress: Not on file  Social Connections: Not on file  Intimate Partner Violence: Not on file   Social History   Social History Narrative  . Not on file     ROS: Negative.   B. BREAST, LEFT AT 2:00, 6 CM FROM THE NIPPLE; ULTRASOUND-GUIDED CORE  NEEDLE BIOPSY:    - FIBROEPITHELIAL PROLIFERATION WITH SCLEROSIS.  - NEGATIVE FOR ATYPICAL PROLIFERATIVE BREAST DISEASE IN THIS SAMPLE.   Comment:  Diagnostic considerations include (but are not limited to) a complex  fibroadenoma, intraductal papilloma with sclerosis, or complex  sclerosing lesion. Although atypia is not present in this biopsy, it  cannot be excluded elsewhere in this lesion. Correlation with  radiographic findings is required.  PE: HEENT: Negative. Lungs: Clear. Cardio: RR.   Assessment/Plan:  Proceed with planned excisional biopsy of left breast lesion.  Merrily P[facility] 02/03/2021

## 2021-02-03 NOTE — Anesthesia Procedure Notes (Signed)
Procedure Name: LMA Insertion Date/Time: 02/03/2021 9:23 AM Performed by: Reece Agar, CRNA Pre-anesthesia Checklist: Patient identified, Emergency Drugs available, Suction available and Patient being monitored Patient Re-evaluated:Patient Re-evaluated prior to induction Oxygen Delivery Method: Circle system utilized Preoxygenation: Pre-oxygenation with 100% oxygen Induction Type: IV induction Ventilation: Mask ventilation without difficulty LMA: LMA inserted LMA Size: 3.5 Tube type: Oral Number of attempts: 1 Placement Confirmation: ETT inserted through vocal cords under direct vision,  positive ETCO2 and breath sounds checked- equal and bilateral Tube secured with: Tape Dental Injury: Teeth and Oropharynx as per pre-operative assessment

## 2021-02-03 NOTE — Transfer of Care (Signed)
Immediate Anesthesia Transfer of Care Note  Patient: Ketsia Linebaugh Kimmons  Procedure(s) Performed: BREAST BIOPSY WITH NEEDLE LOCALIZATION (Left Breast)  Patient Location: PACU  Anesthesia Type:General  Level of Consciousness: awake, drowsy and patient cooperative  Airway & Oxygen Therapy: Patient Spontanous Breathing and Patient connected to face mask oxygen  Post-op Assessment: Report given to RN and Post -op Vital signs reviewed and stable  Post vital signs: Reviewed and stable  Last Vitals:  Vitals Value Taken Time  BP 154/98 02/03/21 1015  Temp    Pulse 90 02/03/21 1018  Resp 15 02/03/21 1018  SpO2 99 % 02/03/21 1018    Last Pain:  Vitals:   02/03/21 0850  TempSrc: Temporal  PainSc: 0-No pain      Patients Stated Pain Goal: 0 (02/03/21 0850)  Complications: No complications documented.

## 2021-02-03 NOTE — Anesthesia Postprocedure Evaluation (Signed)
Anesthesia Post Note  Patient: Megan [name]  Procedure(s) Performed: BREAST BIOPSY WITH NEEDLE LOCALIZATION (Left Breast)  Patient location during evaluation: PACU Anesthesia Type: General Level of consciousness: awake and alert Pain management: pain level controlled Vital Signs Assessment: post-procedure vital signs reviewed and stable Respiratory status: spontaneous breathing and respiratory function stable Cardiovascular status: stable Anesthetic complications: no   No complications documented.   Last Vitals:  Vitals:   02/03/21 1114 02/03/21 1136  BP: 140/89 135/88  Pulse: 82 86  Resp: 16 16  Temp: 36.6 C   SpO2: 100% 99%    Last Pain:  Vitals:   02/03/21 1136  TempSrc:   PainSc: 0-No pain                 Mattie Nordell K

## 2021-02-03 NOTE — Discharge Instructions (Signed)
AMBULATORY SURGERY  DISCHARGE INSTRUCTIONS   1) The drugs that you were given will stay in your system until tomorrow so for the next 24 hours you should not:  A) Drive an automobile B) Make any legal decisions C) Drink any alcoholic beverage   2) You may resume regular meals tomorrow.  Today it is better to start with liquids and gradually work up to solid foods.  You may eat anything you prefer, but it is better to start with liquids, then soup and crackers, and gradually work up to solid foods.   3) Please notify your doctor immediately if you have any unusual bleeding, trouble breathing, redness and pain at the surgery site, drainage, fever, or pain not relieved by medication.     Breast Biopsy, Care After These instructions give you information about caring for yourself after your procedure. Your doctor may also give you more specific instructions. Call your doctor if you have any problems or questions after your procedure. What can I expect after the procedure? After your procedure, it is common to have:  Bruising on your breast.  Numbness, tingling, or pain near your biopsy site. Follow these instructions at home: Medicines  Take over-the-counter and prescription medicines only as told by your doctor.  Do not drive for 24 hours if you were given a medicine to help you relax (sedative) during your procedure.  Do not drink alcohol while taking pain medicine.  Do not drive or use heavy machinery while taking prescription pain medicine. Biopsy site care  Follow instructions from your doctor about how to take care of your cut from surgery (incision) or your puncture area. Make sure you: ? Wash your hands with soap and water before you change your bandage (dressing). If you cannot use soap and water, use hand sanitizer. ? Change your bandage as told by your doctor. ? Leave stitches (sutures), skin glue, or skin tape (adhesive strips) in place. They may need to stay in  place for 2 weeks or longer. If tape strips get loose and curl up, you may trim the loose edges. Do not remove tape strips completely unless your doctor says it is okay.  If you have stitches, keep them dry when you take a bath or a shower.  Check your cut or puncture area every day for signs of infection. Check for: ? Redness, swelling, or pain. ? Fluid or blood. ? Warmth. ? Pus or a bad smell.  Protect the biopsy area. Do not let the area get bumped.      Activity  If you had a cut during your procedure, avoid activities that could pull your cut open. These include: ? Stretching. ? Reaching over your head. ? Exercise. ? Sports. ? Lifting anything that weighs more than 3 lb (1.4 kg).  Return to your normal activities as told by your doctor. Ask your doctor what activities are safe for you. Managing pain, stiffness, and swelling If told, put ice on the biopsy site to relieve swelling:  Put ice in a plastic bag.  Place a towel between your skin and the bag.  Leave the ice on for 20 minutes, 2-3 times a day. General instructions  Continue your normal diet.  Wear a good support bra for as long as told by your doctor.  Get checked for extra fluid around your lymph nodes (lymphedema) as often as told by your doctor.  Keep all follow-up visits as told by your doctor. This is important. Contact a doctor if:  You notice any of the following at the biopsy site: ? More redness, swelling, or pain. ? More fluid or blood coming from the site. ? The site feels warm to the touch. ? Pus or a bad smell coming from the site. ? The site breaks open after the stitches or skin tape strips have been removed.  You have a rash.  You have a fever. Get help right away if:  You have more bleeding from the biopsy site. Get help right away if bleeding is more than a small spot.  You have trouble breathing.  You have red streaks around the biopsy site. Summary  After your procedure, it  is common to have bruising, numbness, tingling, or pain near the biopsy site.  Do not drive or use heavy machinery while taking prescription pain medicine.  Wear a good support bra for as long as told by your doctor.  If you had a cut during your procedure, avoid activities that may pull the cut open. Ask your doctor what activities are safe for you. This information is not intended to replace advice given to you by your health care provider. Make sure you discuss any questions you have with your health care provider. Document Revised: 04/29/2020 Document Reviewed: 02/03/2018 Elsevier Patient Education  2021 [employer]     Please contact your physician with any problems or Same Day Surgery at [phone], Monday through Friday 6 am to 4 pm, or [place] number at [phone].

## 2021-02-03 NOTE — Anesthesia Preprocedure Evaluation (Signed)
Anesthesia Evaluation  Patient identified by MRN, date of birth, ID band Patient awake    Reviewed: Allergy & Precautions, NPO status , Patient's Chart, lab work & pertinent test results  History of Anesthesia Complications Negative for: history of anesthetic complications  Airway Mallampati: II       Dental   Pulmonary asthma , neg sleep apnea, neg COPD, Not current smoker,           Cardiovascular (-) hypertension(-) CHF (-) dysrhythmias (-) Valvular Problems/Murmurs     Neuro/Psych neg Seizures Anxiety    GI/Hepatic Neg liver ROS, PUD, Bowel prep,neg GERD  ,  Endo/Other  neg diabetesHypothyroidism   Renal/GU negative Renal ROS     Musculoskeletal   Abdominal   Peds  Hematology   Anesthesia Other Findings   Reproductive/Obstetrics                             Anesthesia Physical Anesthesia Plan  ASA: II  Anesthesia Plan: General   Post-op Pain Management:    Induction: Intravenous  PONV Risk Score and Plan: 3 and Ondansetron and Dexamethasone  Airway Management Planned: LMA  Additional Equipment:   Intra-op Plan:   Post-operative Plan:   Informed Consent: I have reviewed the patients History and Physical, chart, labs and discussed the procedure including the risks, benefits and alternatives for the proposed anesthesia with the patient or authorized representative who has indicated his/her understanding and acceptance.       Plan Discussed with:   Anesthesia Plan Comments:         Anesthesia Quick Evaluation

## 2021-02-05 LAB — SURGICAL PATHOLOGY

## 2021-06-07 ENCOUNTER — Other Ambulatory Visit: Payer: Self-pay

## 2021-06-07 ENCOUNTER — Encounter: Payer: Self-pay | Admitting: Emergency Medicine

## 2021-06-07 DIAGNOSIS — J45909 Unspecified asthma, uncomplicated: Secondary | ICD-10-CM | POA: Insufficient documentation

## 2021-06-07 DIAGNOSIS — R591 Generalized enlarged lymph nodes: Secondary | ICD-10-CM | POA: Insufficient documentation

## 2021-06-07 DIAGNOSIS — R791 Abnormal coagulation profile: Secondary | ICD-10-CM | POA: Insufficient documentation

## 2021-06-07 DIAGNOSIS — R202 Paresthesia of skin: Secondary | ICD-10-CM | POA: Insufficient documentation

## 2021-06-07 DIAGNOSIS — Z8616 Personal history of COVID-19: Secondary | ICD-10-CM | POA: Insufficient documentation

## 2021-06-07 DIAGNOSIS — E039 Hypothyroidism, unspecified: Secondary | ICD-10-CM | POA: Insufficient documentation

## 2021-06-07 DIAGNOSIS — Z79899 Other long term (current) drug therapy: Secondary | ICD-10-CM | POA: Insufficient documentation

## 2021-06-07 DIAGNOSIS — R7401 Elevation of levels of liver transaminase levels: Secondary | ICD-10-CM | POA: Insufficient documentation

## 2021-06-07 NOTE — ED Notes (Signed)
Pt's care discussed with Dr. Don Perking, [facility] for blood work and head CT.

## 2021-06-07 NOTE — ED Triage Notes (Signed)
Pt to ED via POV with c/o tingling to lips, face and top of head. Pt states symptoms started earlier this morning, also states radiates down L arm. Pt states awoke with symptoms at approx 0930 06/07/2021. Pt states feels pain in her face "like something is sticking [her]". Pt with  noted facial symmetry intact. Pt also states feels like her lips are swollen, airway intact at this time.

## 2021-06-08 ENCOUNTER — Emergency Department: Payer: Self-pay

## 2021-06-08 ENCOUNTER — Encounter: Payer: Self-pay | Admitting: Radiology

## 2021-06-08 ENCOUNTER — Emergency Department
Admission: EM | Admit: 2021-06-08 | Discharge: 2021-06-08 | Disposition: A | Discharge status: Home / Self Care | Payer: Self-pay | Attending: Emergency Medicine | Admitting: Emergency Medicine

## 2021-06-08 DIAGNOSIS — R7401 Elevation of levels of liver transaminase levels: Secondary | ICD-10-CM

## 2021-06-08 DIAGNOSIS — R590 Localized enlarged lymph nodes: Secondary | ICD-10-CM

## 2021-06-08 DIAGNOSIS — R202 Paresthesia of skin: Secondary | ICD-10-CM

## 2021-06-08 LAB — COMPREHENSIVE METABOLIC PANEL
ALT: 77 U/L — ABNORMAL HIGH (ref 0–44)
AST: 108 U/L — ABNORMAL HIGH (ref 15–41)
Albumin: 3.7 g/dL (ref 3.5–5.0)
Alkaline Phosphatase: 59 U/L (ref 38–126)
Anion gap: 4 — ABNORMAL LOW (ref 5–15)
BUN: 14 mg/dL (ref 6–20)
CO2: 27 mmol/L (ref 22–32)
Calcium: 9.2 mg/dL (ref 8.9–10.3)
Chloride: 106 mmol/L (ref 98–111)
Creatinine, Ser: 0.91 mg/dL (ref 0.44–1.00)
GFR, Estimated: >60 mL/min (ref >60)
Glucose, Bld: 107 mg/dL — ABNORMAL HIGH (ref 70–99)
Potassium: 3.9 mmol/L (ref 3.5–5.1)
Sodium: 137 mmol/L (ref 135–145)
Total Bilirubin: 0.8 mg/dL (ref 0.3–1.2)
Total Protein: 8.5 g/dL — ABNORMAL HIGH (ref 6.5–8.1)

## 2021-06-08 LAB — CBG MONITORING, ED: Glucose-Capillary: 102 mg/dL — ABNORMAL HIGH (ref 70–99)

## 2021-06-08 LAB — CBC
HCT: 30.5 % — ABNORMAL LOW (ref 36.0–46.0)
Hemoglobin: 9.2 g/dL — ABNORMAL LOW (ref 12.0–15.0)
MCH: 22.4 pg — ABNORMAL LOW (ref 26.0–34.0)
MCHC: 30.2 g/dL (ref 30.0–36.0)
MCV: 74.2 fL — ABNORMAL LOW (ref 80.0–100.0)
Platelets: 191 10*3/uL (ref 150–400)
RBC: 4.11 MIL/uL (ref 3.87–5.11)
RDW: 18.4 % — ABNORMAL HIGH (ref 11.5–15.5)
WBC: 5.4 10*3/uL (ref 4.0–10.5)
nRBC: 0 % (ref 0.0–0.2)

## 2021-06-08 LAB — DIFFERENTIAL
Abs Immature Granulocytes: 0.02 10*3/uL (ref 0.00–0.07)
Basophils Absolute: 0 10*3/uL (ref 0.0–0.1)
Basophils Relative: 0 %
Eosinophils Absolute: 0.1 10*3/uL (ref 0.0–0.5)
Eosinophils Relative: 1 %
Immature Granulocytes: 0 %
Lymphocytes Relative: 53 %
Lymphs Abs: 2.9 10*3/uL (ref 0.7–4.0)
Monocytes Absolute: 0.6 10*3/uL (ref 0.1–1.0)
Monocytes Relative: 11 %
Neutro Abs: 1.9 10*3/uL (ref 1.7–7.7)
Neutrophils Relative %: 35 %

## 2021-06-08 LAB — PROTIME-INR
INR: 1 (ref 0.8–1.2)
Prothrombin Time: 13.1 seconds (ref 11.4–15.2)

## 2021-06-08 LAB — POC URINE PREG, ED: Preg Test, Ur: NEGATIVE

## 2021-06-08 LAB — APTT: aPTT: 29 seconds (ref 24–36)

## 2021-06-08 MED ORDER — KETOROLAC TROMETHAMINE 30 MG/ML IJ SOLN
15.0000 mg | Freq: Once | INTRAMUSCULAR | Status: AC
  Administered 2021-06-08: 15 mg via INTRAVENOUS
  Filled 2021-06-08: qty 1

## 2021-06-08 MED ORDER — METOCLOPRAMIDE HCL 5 MG/ML IJ SOLN
10.0000 mg | Freq: Once | INTRAMUSCULAR | Status: AC
  Administered 2021-06-08: 10 mg via INTRAVENOUS
  Filled 2021-06-08: qty 2

## 2021-06-08 MED ORDER — IOHEXOL 350 MG/ML SOLN
60.0000 mL | Freq: Once | INTRAVENOUS | Status: AC | PRN
  Administered 2021-06-08: 60 mL via INTRAVENOUS

## 2021-06-08 MED ORDER — LACTATED RINGERS IV BOLUS
1000.0000 mL | Freq: Once | INTRAVENOUS | Status: AC
  Administered 2021-06-08: 1000 mL via INTRAVENOUS

## 2021-06-08 NOTE — Discharge Instructions (Signed)
As we discussed, there are several enlarged lymph nodes in your neck.  Is important they follow-up with hematology oncology for further evaluation to rule out cancer.  Also your liver enzymes were elevated.  Follow-up with your primary care doctor for further evaluation of your liver.  Follow-up with your PCP in 2 days if you are still having the pins-and-needles sensation in your face.  Return to the emergency room for facial droop, unilateral weakness or numbness, room spinning sensation, difficulty swallowing or changes in vision.

## 2021-06-08 NOTE — ED Provider Notes (Signed)
[facility] Emergency Department Provider Note  ____________________________________________  Time seen: Approximately 2:28 AM  I have reviewed the triage vital signs and the nursing notes.   HISTORY  Chief Complaint Numbness   HPI Megan [name] is a 50 y.o. female with a history of hypothyroidism, asthma, obesity, and migraine headaches who presents for evaluation of numbness.  Patient reports that she woke up this morning at 9 AM with pins-and-needles on the left side of her face, upper and lower bilateral lips, and left arm.  She reports that her symptoms have been constant and progressively worse throughout the day which prompted her visit to the ER now.  She reports that later in the day she started having a little bit of a headache that she describes as currently moderate, throbbing, located on the left side of her head.  She did not have a headache this morning when her symptoms started.  She has no history of smoking and denies slurred speech, facial droop, unilateral weakness, vertigo, changes in vision, dysarthria, dysphagia.   Past Medical History:  Diagnosis Date   Anxiety    Asthma    COVID-19 virus infection 08/2020   Hypothyroidism    Morbid obesity [facility])     Patient Active Problem List   Diagnosis Date Noted   Hypothyroidism    Morbid obesity (HCC)    Asthma    COVID-19 virus infection 08/2020   Nipple discharge 12/03/2015   Abnormal mammogram of left breast 12/03/2015    Past Surgical History:  Procedure Laterality Date   BREAST BIOPSY Right 11/11/2020   [place] bx, vision marker, - FRAGMENTS OF BENIGN INTRAMAMMARY LYMPH NODE.  NEGATIVE FOR ATYPICAL PROLIFERATIVE BREAST DISEASE.   BREAST BIOPSY Left 11/11/2020`   [place] bx, Q shape, FIBROEPITHELIAL PROLIFERATION WITH SCLEROSIS. NEGATIVE FOR ATYPICAL PROLIFERATIVE BREAST DISEASE IN THIS SAMPLE.    BREAST BIOPSY Left 12/03/2020   [place] bx, vision clip, fiboadenoma   BREAST BIOPSY Left  12/03/2020   [place] bx, x-clip, fibroadenoma   BREAST BIOPSY Left 02/03/2021   Procedure: BREAST BIOPSY WITH NEEDLE LOCALIZATION;  Surgeon: [name], MD;  Location: [facility] ORS;  Service: General;  Laterality: Left;   BREAST LUMPECTOMY Left 02/03/2021   LT 2:00 X clip NL done prior to lumpectomy    CESAREAN SECTION     FOOT SURGERY     bone spur   TUBAL LIGATION      Prior to Admission medications   Medication Sig Start Date End Date Taking? Authorizing Provider  Levothyroxine Sodium 88 MCG CAPS Take 1 capsule by mouth daily before breakfast.    [provider]  loratadine (CLARITIN) 10 MG tablet Take 10 mg by mouth daily as needed for allergies.    [provider]  PARoxetine (PAXIL) 20 MG tablet Take 20 mg by mouth daily.    [provider]  traMADol (ULTRAM) 50 MG tablet Take 1 tablet (50 mg total) by mouth every 4 (four) hours as needed. 02/03/21 02/03/22  [name], MD  traZODone (DESYREL) 50 MG tablet Take 50 mg by mouth at bedtime as needed for sleep.    [provider]    Allergies Patient has no known allergies.  Family History  Problem Relation Age of Onset   Breast cancer Maternal Grandmother        great grandmother    Social History Social History   Tobacco Use   Smoking status: Never   Smokeless tobacco: Never  Vaping  Use   Vaping Use: Never used  Substance Use Topics   Alcohol use: No   Drug use: No    Review of Systems  Constitutional: Negative for fever. Eyes: Negative for visual changes. ENT: Negative for sore throat. Neck: No neck pain  Cardiovascular: Negative for chest pain. Respiratory: Negative for shortness of breath. Gastrointestinal: Negative for abdominal pain, vomiting or diarrhea. Genitourinary: Negative for dysuria. Musculoskeletal: Negative for back pain. Skin: Negative for rash. Neurological: + HA and numbness of the L face and LUE Psych: No SI or  HI  ____________________________________________   PHYSICAL EXAM:  VITAL SIGNS: ED Triage Vitals  Enc Vitals Group     BP 06/07/21 2350 (!) 144/103     Pulse Rate 06/07/21 2350 84     Resp 06/07/21 2350 18     Temp 06/07/21 2350 98.3 F (36.8 C)     Temp Source 06/07/21 2350 Oral     SpO2 06/07/21 2350 97 %     Weight 06/07/21 2348 200 lb (90.7 kg)     Height 06/07/21 2348 5\' 5"  (1.651 m)     Head Circumference --      Peak Flow --      Pain Score 06/07/21 2346 8     Pain Loc --      Pain Edu? --      Excl. in GC? --     Constitutional: Alert and oriented. Well appearing and in no apparent distress. HEENT:      Head: Normocephalic and atraumatic.         Eyes: Conjunctivae are normal. Sclera is non-icteric.       Mouth/Throat: Mucous membranes are moist.       Neck: Supple with no signs of meningismus. Cardiovascular: Regular rate and rhythm. No murmurs, gallops, or rubs. 2+ symmetrical distal pulses are present in all extremities. No JVD. Respiratory: Normal respiratory effort. Lungs are clear to auscultation bilaterally.  Gastrointestinal: Soft, non tender, and non distended with positive bowel sounds. No rebound or guarding. Genitourinary: No CVA tenderness. Musculoskeletal:  No edema, cyanosis, or erythema of extremities. Neurologic: Normal speech and language. Face is symmetric. EOMI, PERRl, intact strength x4, no pronator drift, no dysmetria.  Patient does report slightly decreased sensation to touch on the left face. Skin: Skin is warm, dry and intact. No rash noted. Psychiatric: Mood and affect are normal. Speech and behavior are normal.  ____________________________________________   LABS (all labs ordered are listed, but only abnormal results are displayed)  Labs Reviewed  CBC - Abnormal; Notable for the following components:      Result Value   Hemoglobin 9.2 (*)    HCT 30.5 (*)    MCV 74.2 (*)    MCH 22.4 (*)    RDW 18.4 (*)    All other components  within normal limits  COMPREHENSIVE METABOLIC PANEL - Abnormal; Notable for the following components:   Glucose, Bld 107 (*)    Total Protein 8.5 (*)    AST 108 (*)    ALT 77 (*)    Anion gap 4 (*)    All other components within normal limits  CBG MONITORING, ED - Abnormal; Notable for the following components:   Glucose-Capillary 102 (*)    All other components within normal limits  PROTIME-INR  APTT  DIFFERENTIAL  POC URINE PREG, ED   ____________________________________________  EKG  ED ECG REPORT I, Nita Sickle, the attending physician, personally viewed and interpreted this ECG.  Sinus rhythm  with a rate of 81, normal intervals, normal axis, no ST elevations or depressions.  Normal EKG. ____________________________________________  RADIOLOGY  I have personally reviewed the images performed during this visit and I agree with the Radiologist's read.   Interpretation by Radiologist:  CT HEAD WO CONTRAST  Result Date: 06/08/2021 CLINICAL DATA:  Numbness/Tingling unilateral. Paresthesia involving the lips, face, and scalp EXAM: CT HEAD WITHOUT CONTRAST TECHNIQUE: Contiguous axial images were obtained from the base of the skull through the vertex without intravenous contrast. COMPARISON:  None. FINDINGS: Brain: Normal anatomic configuration. No abnormal intra or extra-axial mass lesion or fluid collection. No abnormal mass effect or midline shift. No evidence of acute intracranial hemorrhage or infarct. Ventricular size is normal. Cerebellum unremarkable. Vascular: Unremarkable Skull: Intact Sinuses/Orbits: There is mucosal thickening within the left sphenoid sinus and bubbly secretions within the posterior right ethmoid air cell. Remaining paranasal sinuses are clear. Orbits are unremarkable. Other: Mastoid air cells and middle ear cavities are clear. IMPRESSION: No acute intracranial abnormality. Mild paranasal sinus disease. Electronically Signed   By: Helyn Numbers M.D.    On: 06/08/2021 00:57   CT Soft Tissue Neck W Contrast  Result Date: 06/08/2021 CLINICAL DATA:  50 year old female with abnormal numbness and tingling involving the lips, face, and scalp. Adenoid hypertrophy and questionable cervical lymphadenopathy on brain MRI earlier today. EXAM: CT NECK WITH CONTRAST TECHNIQUE: Multidetector CT imaging of the neck was performed using the standard protocol following the bolus administration of intravenous contrast. CONTRAST:  35mL OMNIPAQUE IOHEXOL 350 MG/ML SOLN COMPARISON:  Brain MRI 0336 hours. FINDINGS: Pharynx and larynx: Negative larynx. Hypopharynx within normal limits. Palatine tonsils appear to be present and relatively normal, symmetric. There is generalized adenoid hypertrophy (series 2, image 19). No heterogeneous adenoid enhancement. Negative parapharyngeal spaces. Retropharyngeal space is remarkable for both prominent retropharyngeal lymph nodes (8 mm short axis series 2, image 30) and of partially retropharyngeal course of both carotid arteries. Salivary glands: Negative sublingual space. Negative submandibular glands. Parotid glands appear negative aside from mild prominence of subcentimeter bilateral parotid space lymph nodes. Thyroid: Negative. Lymph nodes: Lymph nodes throughout the visible bilateral neck are increased in number, and many nodes are approaching 10 mm short axis. The largest nodes are 12-13 mm short axis, including at the bilateral level 2 and left level 1A nodal stations. No cystic or necrotic nodes. Vascular: Partially retropharyngeal course of both carotids. The major vascular structures in the neck and at the skull base are patent. Limited intracranial: Negative. Visualized orbits: Negative. Mastoids and visualized paranasal sinuses: Mild bubbly opacity and mucosal thickening in the posterior right ethmoid air cell. Similar mucosal thickening in the left sphenoid sinus and right maxillary alveolar recess. Tympanic cavities and visible  mastoids are clear. Skeleton: No acute dental finding. Congenital incomplete ossification of the posterior C1 ring. C5-C6 cervical disc and endplate degeneration. No acute or suspicious osseous lesion. Upper chest: Visible bilateral axillary lymph nodes are small but appear increased in number (series 2, image 88 on the right). No superior mediastinal lymphadenopathy is identified. Lung apices are clear. IMPRESSION: 1. Generalized adenoid hypertrophy and bilateral cervical lymph nodes with diffuse mild increase in size and number. Numerous maximal 10 mm nodes, and the largest bilateral largest nodes are 12-13 mm short axis. No cystic or necrotic nodes, and the remaining pharynx appears normal. This appearance is nonspecific, and can be occasionally physiologic in patients with large body habitus, but is suspicious for a Lymphoproliferative disorder. 2. Minor paranasal sinus inflammation, significance  doubtful. Electronically Signed   By: Odessa Fleming M.D.   On: 06/08/2021 05:13   MR BRAIN WO CONTRAST  Result Date: 06/08/2021 CLINICAL DATA:  50 year old female with abnormal numbness and tingling involving the lips, face, and scalp. EXAM: MRI HEAD WITHOUT CONTRAST TECHNIQUE: Multiplanar, multiecho pulse sequences of the brain and surrounding structures were obtained without intravenous contrast. COMPARISON:  Head CT 0042 hours today. FINDINGS: Brain: Normal cerebral volume. No restricted diffusion to suggest acute infarction. No midline shift, mass effect, evidence of mass lesion, ventriculomegaly, extra-axial collection or acute intracranial hemorrhage. Cervicomedullary junction and pituitary are within normal limits. Wallace Cullens and white matter signal is within normal limits for age throughout the brain. No chronic cerebral blood products or encephalomalacia is identified. Vascular: Major intracranial vascular flow voids are preserved. Skull and upper cervical spine: Negative. Sinuses/Orbits: Negative orbits. Mild ethmoid  and sphenoid sinus mucosal thickening. No sinus fluid levels. Other: Trace fluid in the left mastoid air cells. There is symmetric adenoid hypertrophy (series 10, image 1). Coronal images suggest prominent cervical lymph nodes. But scalp and face soft tissues appear symmetric and negative. IMPRESSION: 1. Normal for age noncontrast MRI appearance of the brain. 2. Nonspecific adenoid hypertrophy and suggestion of prominent upper cervical lymph nodes. If there is any palpable lymphadenopathy in the neck then consider a lymphoproliferative disorder, and Neck CT with IV contrast might be valuable. Electronically Signed   By: Odessa Fleming M.D.   On: 06/08/2021 04:28     ____________________________________________   PROCEDURES  Procedure(s) performed: yes .1-3 Lead EKG Interpretation Performed by: Nita Sickle, MD Authorized by: Nita Sickle, MD     Interpretation: normal     ECG rate assessment: normal     Rhythm: sinus rhythm     Ectopy: none     Conduction: normal     Critical Care performed:  None ____________________________________________   INITIAL IMPRESSION / ASSESSMENT AND PLAN / ED COURSE   50 y.o. female with a history of hypothyroidism, asthma, obesity, and migraine headaches who presents for evaluation of pins and needle sensation to bilateral lips, left face and left upper extremity.  Patient is neurologically intact other than slightly decreased sensation to the touch of the left face on exam.  She does endorse a throbbing left-sided headache. No neck pain, no bruit.  Ddx CVA, TIA, complex migraine, aneurysm.  Head CT was done showing no acute findings, visualized by me and confirmed by radiology.  Patient received a migraine cocktail and continues to complain of unchanged symptoms.  Therefore an MRI has been ordered.  EKG with no signs of dysrhythmias.  Labs showing normal blood glucose, no significant electrolyte derangements or dehydration.  Patient does have slightly  elevated LFTs which seems to be new.  She has no abdominal pain.  This finding was discussed with patient for outpatient follow-up.  _________________________ 5:22 AM on 06/08/2021 ----------------------------------------- MRI showing no signs of an acute stroke, but was concerning for nonspecific cervical lymphadenopathy and therefore radiologist recommended a CT of the neck with contrast.  The CT showed generalized adenoid hypertrophy and bilateral cervical lymph node increased in size and number.  Discussed these findings with patient and recommended follow-up with heme-onc for further evaluation.  A referral has been placed.  Also discussed follow-up with PCP for evaluation of mild transaminitis.  At this time patient is stable for discharge home.      _____________________________________________ Please note:  Patient was evaluated in Emergency Department today for the symptoms described in  the history of present illness. Patient was evaluated in the context of the global COVID-19 pandemic, which necessitated consideration that the patient might be at risk for infection with the SARS-CoV-2 virus that causes COVID-19. Institutional protocols and algorithms that pertain to the evaluation of patients at risk for COVID-19 are in a state of rapid change based on information released by regulatory bodies including the CDC and federal and state organizations. These policies and algorithms were followed during the patient's care in the ED.  Some ED evaluations and interventions may be delayed as a result of limited staffing during the pandemic.   [place] Controlled Substance Database was reviewed by me. ____________________________________________   FINAL CLINICAL IMPRESSION(S) / ED DIAGNOSES   Final diagnoses:  Transaminitis  Paresthesia  Cervical lymphadenopathy      NEW MEDICATIONS STARTED DURING THIS VISIT:  ED Discharge Orders          Ordered    Ambulatory referral to Hematology /  Oncology        06/08/21 0521             Note:  This document was prepared using Dragon voice recognition software and may include unintentional dictation errors.    Nita Sickle, MD 06/08/21 [phone]

## 2021-06-08 NOTE — ED Notes (Signed)
Patient transported to CT 

## 2021-06-11 ENCOUNTER — Other Ambulatory Visit: Payer: Self-pay

## 2021-06-11 ENCOUNTER — Inpatient Hospital Stay: Payer: Self-pay

## 2021-06-11 ENCOUNTER — Inpatient Hospital Stay
Admission: EM | Admit: 2021-06-11 | Discharge: 2021-06-18 | DRG: 866 | Disposition: A | Discharge status: Home / Self Care | Payer: Self-pay | Attending: Internal Medicine | Admitting: Internal Medicine

## 2021-06-11 DIAGNOSIS — B028 Zoster with other complications: Principal | ICD-10-CM | POA: Diagnosis present

## 2021-06-11 DIAGNOSIS — Z833 Family history of diabetes mellitus: Secondary | ICD-10-CM

## 2021-06-11 DIAGNOSIS — D509 Iron deficiency anemia, unspecified: Secondary | ICD-10-CM | POA: Diagnosis present

## 2021-06-11 DIAGNOSIS — R7989 Other specified abnormal findings of blood chemistry: Secondary | ICD-10-CM

## 2021-06-11 DIAGNOSIS — B029 Zoster without complications: Secondary | ICD-10-CM | POA: Diagnosis present

## 2021-06-11 DIAGNOSIS — E669 Obesity, unspecified: Secondary | ICD-10-CM | POA: Diagnosis present

## 2021-06-11 DIAGNOSIS — B0089 Other herpesviral infection: Secondary | ICD-10-CM

## 2021-06-11 DIAGNOSIS — K760 Fatty (change of) liver, not elsewhere classified: Secondary | ICD-10-CM | POA: Diagnosis present

## 2021-06-11 DIAGNOSIS — Z803 Family history of malignant neoplasm of breast: Secondary | ICD-10-CM

## 2021-06-11 DIAGNOSIS — E559 Vitamin D deficiency, unspecified: Secondary | ICD-10-CM | POA: Diagnosis present

## 2021-06-11 DIAGNOSIS — B0239 Other herpes zoster eye disease: Secondary | ICD-10-CM

## 2021-06-11 DIAGNOSIS — Z7952 Long term (current) use of systemic steroids: Secondary | ICD-10-CM

## 2021-06-11 DIAGNOSIS — E785 Hyperlipidemia, unspecified: Secondary | ICD-10-CM | POA: Diagnosis present

## 2021-06-11 DIAGNOSIS — E039 Hypothyroidism, unspecified: Secondary | ICD-10-CM | POA: Diagnosis present

## 2021-06-11 DIAGNOSIS — B0230 Zoster ocular disease, unspecified: Secondary | ICD-10-CM

## 2021-06-11 DIAGNOSIS — R1310 Dysphagia, unspecified: Secondary | ICD-10-CM | POA: Diagnosis present

## 2021-06-11 DIAGNOSIS — Z79890 Hormone replacement therapy (postmenopausal): Secondary | ICD-10-CM

## 2021-06-11 DIAGNOSIS — L303 Infective dermatitis: Secondary | ICD-10-CM

## 2021-06-11 DIAGNOSIS — J4520 Mild intermittent asthma, uncomplicated: Secondary | ICD-10-CM | POA: Diagnosis present

## 2021-06-11 DIAGNOSIS — F32A Depression, unspecified: Secondary | ICD-10-CM | POA: Diagnosis present

## 2021-06-11 DIAGNOSIS — Z6833 Body mass index (BMI) 33.0-33.9, adult: Secondary | ICD-10-CM

## 2021-06-11 DIAGNOSIS — Z21 Asymptomatic human immunodeficiency virus [HIV] infection status: Secondary | ICD-10-CM | POA: Diagnosis present

## 2021-06-11 DIAGNOSIS — Z8616 Personal history of COVID-19: Secondary | ICD-10-CM

## 2021-06-11 LAB — CBC WITH DIFFERENTIAL/PLATELET
Abs Immature Granulocytes: 0.03 10*3/uL (ref 0.00–0.07)
Basophils Absolute: 0 10*3/uL (ref 0.0–0.1)
Basophils Relative: 0 %
Eosinophils Absolute: 0 10*3/uL (ref 0.0–0.5)
Eosinophils Relative: 0 %
HCT: 32.9 % — ABNORMAL LOW (ref 36.0–46.0)
Hemoglobin: 10.1 g/dL — ABNORMAL LOW (ref 12.0–15.0)
Immature Granulocytes: 0 %
Lymphocytes Relative: 39 %
Lymphs Abs: 2.7 10*3/uL (ref 0.7–4.0)
MCH: 22.3 pg — ABNORMAL LOW (ref 26.0–34.0)
MCHC: 30.7 g/dL (ref 30.0–36.0)
MCV: 72.8 fL — ABNORMAL LOW (ref 80.0–100.0)
Monocytes Absolute: 0.7 10*3/uL (ref 0.1–1.0)
Monocytes Relative: 10 %
Neutro Abs: 3.5 10*3/uL (ref 1.7–7.7)
Neutrophils Relative %: 51 %
Platelets: 211 10*3/uL (ref 150–400)
RBC: 4.52 MIL/uL (ref 3.87–5.11)
RDW: 18.3 % — ABNORMAL HIGH (ref 11.5–15.5)
WBC: 7 10*3/uL (ref 4.0–10.5)
nRBC: 0 % (ref 0.0–0.2)

## 2021-06-11 LAB — CK: Total CK: 33 U/L — ABNORMAL LOW (ref 38–234)

## 2021-06-11 LAB — BASIC METABOLIC PANEL
Anion gap: 9 (ref 5–15)
BUN: 11 mg/dL (ref 6–20)
CO2: 25 mmol/L (ref 22–32)
Calcium: 9.7 mg/dL (ref 8.9–10.3)
Chloride: 103 mmol/L (ref 98–111)
Creatinine, Ser: 0.9 mg/dL (ref 0.44–1.00)
GFR, Estimated: >60 mL/min (ref >60)
Glucose, Bld: 98 mg/dL (ref 70–99)
Potassium: 3.9 mmol/L (ref 3.5–5.1)
Sodium: 137 mmol/L (ref 135–145)

## 2021-06-11 LAB — RESP PANEL BY RT-PCR (FLU A&B, COVID) ARPGX2
Influenza A by PCR: NEGATIVE
Influenza B by PCR: NEGATIVE
SARS Coronavirus 2 by RT PCR: NEGATIVE

## 2021-06-11 MED ORDER — LACTATED RINGERS IV BOLUS
1000.0000 mL | Freq: Once | INTRAVENOUS | Status: AC
  Administered 2021-06-11: 1000 mL via INTRAVENOUS

## 2021-06-11 MED ORDER — PAROXETINE HCL 20 MG PO TABS
20.0000 mg | ORAL_TABLET | Freq: Every day | ORAL | Status: DC
  Administered 2021-06-13 – 2021-06-18 (×6): 20 mg via ORAL
  Filled 2021-06-11 (×8): qty 1

## 2021-06-11 MED ORDER — TETRACAINE HCL 0.5 % OP SOLN
1.0000 [drp] | Freq: Once | OPHTHALMIC | Status: AC
  Administered 2021-06-11: 1 [drp] via OPHTHALMIC
  Filled 2021-06-11: qty 4

## 2021-06-11 MED ORDER — LORATADINE 10 MG PO TABS: 10.0000 mg | ORAL_TABLET | Freq: Every day | ORAL | Status: DC | PRN

## 2021-06-11 MED ORDER — PHENOL 1.4 % MT LIQD
1.0000 | OROMUCOSAL | Status: DC | PRN
  Filled 2021-06-11: qty 177

## 2021-06-11 MED ORDER — TRAZODONE HCL 50 MG PO TABS
50.0000 mg | ORAL_TABLET | Freq: Every evening | ORAL | Status: DC | PRN
  Administered 2021-06-12 – 2021-06-17 (×6): 50 mg via ORAL
  Filled 2021-06-11 (×6): qty 1

## 2021-06-11 MED ORDER — FLUORESCEIN SODIUM 1 MG OP STRP
1.0000 | ORAL_STRIP | Freq: Once | OPHTHALMIC | Status: AC
  Administered 2021-06-11: 1 via OPHTHALMIC
  Filled 2021-06-11: qty 1

## 2021-06-11 MED ORDER — LIDOCAINE VISCOUS HCL 2 % MT SOLN
15.0000 mL | OROMUCOSAL | Status: DC | PRN
  Filled 2021-06-11: qty 15

## 2021-06-11 MED ORDER — ALBUTEROL SULFATE (2.5 MG/3ML) 0.083% IN NEBU: 3.0000 mL | INHALATION_SOLUTION | RESPIRATORY_TRACT | Status: DC

## 2021-06-11 MED ORDER — VALACYCLOVIR 50 MG/ML ORAL SUSPENSION: 1000.0000 mg | Freq: Once | ORAL | Status: DC

## 2021-06-11 MED ORDER — LEVOTHYROXINE SODIUM 88 MCG PO TABS
88.0000 ug | ORAL_TABLET | Freq: Every day | ORAL | Status: DC
  Administered 2021-06-12 – 2021-06-18 (×7): 88 ug via ORAL
  Filled 2021-06-11 (×8): qty 1

## 2021-06-11 MED ORDER — ALBUTEROL SULFATE (2.5 MG/3ML) 0.083% IN NEBU
3.0000 mL | INHALATION_SOLUTION | Freq: Four times a day (QID) | RESPIRATORY_TRACT | Status: DC | PRN
  Administered 2021-06-11: 3 mL via RESPIRATORY_TRACT
  Filled 2021-06-11: qty 3

## 2021-06-11 MED ORDER — DEXAMETHASONE SODIUM PHOSPHATE 10 MG/ML IJ SOLN
10.0000 mg | Freq: Once | INTRAMUSCULAR | Status: AC
  Administered 2021-06-11: 10 mg via INTRAVENOUS
  Filled 2021-06-11: qty 1

## 2021-06-11 MED ORDER — DEXAMETHASONE 0.1 % OP SUSP
2.0000 [drp] | OPHTHALMIC | Status: DC
  Administered 2021-06-12 – 2021-06-18 (×26): 2 [drp] via OPHTHALMIC
  Filled 2021-06-11 (×2): qty 5

## 2021-06-11 MED ORDER — KETOROLAC TROMETHAMINE 30 MG/ML IJ SOLN
30.0000 mg | Freq: Four times a day (QID) | INTRAMUSCULAR | Status: AC | PRN
  Administered 2021-06-12 – 2021-06-15 (×11): 30 mg via INTRAVENOUS
  Filled 2021-06-11 (×12): qty 1

## 2021-06-11 MED ORDER — MORPHINE SULFATE (PF) 4 MG/ML IV SOLN
4.0000 mg | Freq: Once | INTRAVENOUS | Status: AC
  Administered 2021-06-11: 4 mg via INTRAVENOUS
  Filled 2021-06-11: qty 1

## 2021-06-11 MED ORDER — ERYTHROMYCIN 5 MG/GM OP OINT
TOPICAL_OINTMENT | Freq: Three times a day (TID) | OPHTHALMIC | Status: DC
  Administered 2021-06-12 (×2): 1 via OPHTHALMIC
  Filled 2021-06-11 (×3): qty 1

## 2021-06-11 MED ORDER — METHYLPREDNISOLONE SODIUM SUCC 40 MG IJ SOLR
40.0000 mg | Freq: Two times a day (BID) | INTRAMUSCULAR | Status: DC
  Administered 2021-06-12 – 2021-06-18 (×14): 40 mg via INTRAVENOUS
  Filled 2021-06-11 (×10): qty 1

## 2021-06-11 MED ORDER — DEXTROSE 5 % IV SOLN
10.0000 mg/kg | Freq: Three times a day (TID) | INTRAVENOUS | Status: AC
  Administered 2021-06-11 – 2021-06-18 (×20): 705 mg via INTRAVENOUS
  Filled 2021-06-11 (×17): qty 14.1
  Filled 2021-06-11: qty 7
  Filled 2021-06-11 (×5): qty 14.1

## 2021-06-11 MED ORDER — PREDNISONE 20 MG PO TABS: 40.0000 mg | ORAL_TABLET | Freq: Every day | ORAL | Status: DC

## 2021-06-11 MED ORDER — ACYCLOVIR 200 MG/5ML PO SUSP
800.0000 mg | Freq: Every day | ORAL | Status: DC
  Filled 2021-06-11 (×3): qty 20

## 2021-06-11 MED ORDER — SODIUM CHLORIDE 0.9 % IV SOLN: INTRAVENOUS | Status: DC

## 2021-06-11 MED ORDER — OXYCODONE HCL 5 MG PO TABS
5.0000 mg | ORAL_TABLET | ORAL | Status: DC | PRN
  Administered 2021-06-12 – 2021-06-18 (×12): 5 mg via ORAL
  Filled 2021-06-11 (×13): qty 1

## 2021-06-11 MED ORDER — DOCUSATE SODIUM 50 MG/5ML PO LIQD
100.0000 mg | Freq: Two times a day (BID) | ORAL | Status: DC | PRN
  Filled 2021-06-11: qty 10

## 2021-06-11 MED ORDER — ACYCLOVIR 200 MG/5ML PO SUSP
800.0000 mg | Freq: Once | ORAL | Status: AC
  Administered 2021-06-11: 800 mg via ORAL
  Filled 2021-06-11: qty 20

## 2021-06-11 MED ORDER — FENTANYL CITRATE PF 50 MCG/ML IJ SOSY
25.0000 ug | PREFILLED_SYRINGE | INTRAMUSCULAR | Status: DC | PRN
  Administered 2021-06-14: 25 ug via INTRAVENOUS
  Filled 2021-06-11: qty 1

## 2021-06-11 NOTE — ED Triage Notes (Signed)
Pt to ED for rash to left side of face and scalp that started on Saturday, states painful. Rash noted to left side face with blisters.  Pt states also went to hair dresser on Saturday and had hair washed in something never washed before.  Swelling noted to left side of lip.   No swelling noted to tongue.  RR even and unlabored

## 2021-06-11 NOTE — ED Provider Notes (Signed)
[facility] Emergency Department Provider Note ____________________________________________   Event Date/Time   First MD Initiated Contact with Patient 06/11/21 1148     (approximate)  I have reviewed the triage vital signs and the nursing notes.  HISTORY  Chief Complaint Rash   HPI Megan [name] is a 50 y.o. femalewho presents to the ED for evaluation of facial rash.   Chart review indicates obese patient with migrainous headaches, hypothyroidism. Was seen in our ED 3 days ago for left-sided facial paresthesias.  Blood work then was benign, demonstrating a microcytic anemia.  CT head and soft tissue neck were unremarkable.  MRI brain performed and was unremarkable as well.  Eventually discharged home with return precautions.  Patient presents today for evaluation of worsening pain and rash erupting to her left-sided upper face over the past 2 days.  She reports vesicles erupting 2 days ago, many are unroofing, and she reports poorly controlled pain, eye pain and left-sided visual distortion.  Reports 10/10 pain with associated sensation of facial and throat swelling, making it difficult to swallow her medications, solid food.  She was inability to tolerate her Valtrex medication due to this.  Denies any respiratory distress, throat or neck trauma or pain.  Denies fevers, cough or shortness of breath.  Past Medical History:  Diagnosis Date  . Anxiety   . Asthma   . COVID-19 virus infection 08/2020  . Hypothyroidism   . Morbid obesity [facility])     Patient Active Problem List   Diagnosis Date Noted  . Hypothyroidism   . Morbid obesity (HCC)   . Asthma   . COVID-19 virus infection 08/2020  . Nipple discharge 12/03/2015  . Abnormal mammogram of left breast 12/03/2015    Past Surgical History:  Procedure Laterality Date  . BREAST BIOPSY Right 11/11/2020   [place] bx, vision marker, - FRAGMENTS OF BENIGN INTRAMAMMARY LYMPH NODE.  NEGATIVE FOR ATYPICAL  PROLIFERATIVE BREAST DISEASE.  Marland Kitchen BREAST BIOPSY Left 11/11/2020`   [place] bx, Q shape, FIBROEPITHELIAL PROLIFERATION WITH SCLEROSIS. NEGATIVE FOR ATYPICAL PROLIFERATIVE BREAST DISEASE IN THIS SAMPLE.   Marland Kitchen BREAST BIOPSY Left 12/03/2020   [place] bx, vision clip, fiboadenoma  . BREAST BIOPSY Left 12/03/2020   [place] bx, x-clip, fibroadenoma  . BREAST BIOPSY Left 02/03/2021   Procedure: BREAST BIOPSY WITH NEEDLE LOCALIZATION;  Surgeon: [name], MD;  Location: [facility] ORS;  Service: General;  Laterality: Left;  . BREAST LUMPECTOMY Left 02/03/2021   LT 2:00 X clip NL done prior to lumpectomy   . CESAREAN SECTION    . FOOT SURGERY     bone spur  . TUBAL LIGATION      Prior to Admission medications   Medication Sig Start Date End Date Taking? Authorizing Provider  Levothyroxine Sodium 88 MCG CAPS Take 1 capsule by mouth daily before breakfast.    [provider]  loratadine (CLARITIN) 10 MG tablet Take 10 mg by mouth daily as needed for allergies.    [provider]  PARoxetine (PAXIL) 20 MG tablet Take 20 mg by mouth daily.    [provider]  traMADol (ULTRAM) 50 MG tablet Take 1 tablet (50 mg total) by mouth every 4 (four) hours as needed. 02/03/21 02/03/22  [name], MD  traZODone (DESYREL) 50 MG tablet Take 50 mg by mouth at bedtime as needed for sleep.    [provider]    Allergies Patient has no known allergies.  Family History  Problem Relation Age  of Onset  . Breast cancer Maternal Grandmother        great grandmother    Social History Social History   Tobacco Use  . Smoking status: Never  . Smokeless tobacco: Never  Vaping Use  . Vaping Use: Never used  Substance Use Topics  . Alcohol use: No  . Drug use: No   Review of Systems  Constitutional: No fever/chills Eyes: Positive for decreased vision on the left. ENT: No sore throat. Cardiovascular: Denies chest pain. Respiratory: Denies shortness of breath. Gastrointestinal: No  abdominal pain.  No nausea, no vomiting.  No diarrhea.  No constipation. Genitourinary: Negative for dysuria. Musculoskeletal: Negative for back pain. Skin: Positive for left-sided facial rash. Neurological: Negative for headaches, focal weakness or numbness. ____________________________________________   PHYSICAL EXAM:  VITAL SIGNS: Vitals:   06/11/21 1128  BP: 135/83  Pulse: 93  Resp: 18  Temp: 98.4 F (36.9 C)  SpO2: 99%    Constitutional: Alert and oriented.  Obviously uncomfortable, laying on her side in a dark room. Eyes:   Head: Left-sided facial swelling to V1 and V2 distributions with associated vesicles, some unroofed.  No evidence of superimposed bacterial infection on these open vesicles. Vesicular lesions and soft tissue swelling to the upper lip on the left.  Quite exquisitely tender to palpation throughout V1 V2 distributions on the left. Nose: No congestion/rhinnorhea. Mouth/Throat: Mucous membranes are moist.  Oropharynx non-erythematous. Mallampati 4, making examination of the posterior oropharynx difficult. Neck: No stridor. No cervical spine tenderness to palpation. Cardiovascular: Normal rate, regular rhythm. Grossly normal heart sounds.  Good peripheral circulation. Respiratory: Normal respiratory effort.  No retractions. Lungs CTAB. Gastrointestinal: Soft , nondistended, nontender to palpation. No CVA tenderness. Musculoskeletal: No lower extremity tenderness nor edema.  No joint effusions. No signs of acute trauma. Neurologic:  Normal speech and language. No gross focal neurologic deficits are appreciated. No gait instability noted. Skin:  Skin is warm, dry and intact. No rash noted. Psychiatric: Mood and affect are normal. Speech and behavior are normal.    ____________________________________________   LABS (all labs ordered are listed, but only abnormal results are displayed)  Labs Reviewed  CBC WITH DIFFERENTIAL/PLATELET - Abnormal; Notable for  the following components:      Result Value   Hemoglobin 10.1 (*)    HCT 32.9 (*)    MCV 72.8 (*)    MCH 22.3 (*)    RDW 18.3 (*)    All other components within normal limits  RESP PANEL BY RT-PCR (FLU A&B, COVID) ARPGX2  BASIC METABOLIC PANEL   ____________________________________________  12 Lead EKG   ____________________________________________  RADIOLOGY  ED MD interpretation:    Official radiology report(s): No results found.  ____________________________________________   PROCEDURES and INTERVENTIONS  Procedure(s) performed (including Critical Care):  Procedures  Medications  morphine 4 MG/ML injection 4 mg (4 mg Intravenous Given 06/11/21 1241)  dexamethasone (DECADRON) injection 10 mg (10 mg Intravenous Given 06/11/21 1240)  lactated ringers bolus 1,000 mL (1,000 mLs Intravenous New Bag/Given 06/11/21 1246)  acyclovir (ZOVIRAX) 200 MG/5ML suspension SUSP 800 mg (800 mg Oral Given 06/11/21 1308)    ____________________________________________   MDM / ED COURSE   50 year old woman presents to the ED with herpes zoster ophthalmicus on the left side, affecting V1 and V2 distributions, requiring medical observation admission.  She is quite uncomfortable with exquisitely tender left-sided face with vesicular lesions throughout.  No evidence of an open globe, but I can see hazy lesions to her cornea  on the left, though her photophobia makes examination difficult.  No evidence of involvement of the extremities or diffusely, beyond V1 V2 distribution.  Due to her inability to safely take her medications at home, swallow solid food, I think she would benefit from observation admission.  We will discuss with ophthalmology and hospitalist.  Clinical Course as of 06/11/21 1419  Wed Jun 11, 2021  1346 Reassessed.  Patient reports continued difficulty swallowing or breathing.  No upper airway obstruction to necessitate intubation or advanced airway at this time.   Anticipate she would have difficulties in outpatient and would benefit from observation admission.  We will discuss the case with hospitalist. [DS]  1417 I discuss with Dr. Brooke Dare, optho, add on topical ABX to prevent super-infection. Systemic antivirals.  [DS]    Clinical Course User Index [DS] Delton Prairie, MD    ____________________________________________   FINAL CLINICAL IMPRESSION(S) / ED DIAGNOSES  Final diagnoses:  Herpes zoster ophthalmicus of left eye  Other herpes zoster eye disease     ED Discharge Orders     None        Henny Strauch Katrinka Blazing   Note:  This document was prepared using Dragon voice recognition software and may include unintentional dictation errors.    Delton Prairie, MD 06/11/21 [phone]

## 2021-06-11 NOTE — Consult Note (Signed)
Pharmacy Note  TANINA BARB is a 50 y.o. female admitted on 06/11/2021 with  herpes zoster .  Pharmacy has been consulted for acyclovir dosing.  Plan: Acyclovir -Will start acyclovir 705mg  IV q8h (10mg /kg of Adj BW) -Pt started on NS @ 152mL/hr  Will continue to monitor renal function and adjust dose as clinically indicated   Height: 5\' 5"  (165.1 cm) Weight: 91 kg (200 lb 9.9 oz) IBW/kg (Calculated) : 57  Temp (24hrs), Avg:98.4 F (36.9 C), Min:98.4 F (36.9 C), Max:98.4 F (36.9 C)  Recent Labs  Lab 06/07/21 2350 06/11/21 1245 06/11/21 1322  WBC 5.4 7.0  --   CREATININE 0.91  --  0.90    Estimated Creatinine Clearance: 85.2 mL/min (by C-G formula based on SCr of 0.9 mg/dL).    No Known Allergies  Antimicrobials this admission: Acyclovir 10/12 >>   Thank you for allowing pharmacy to be a part of this patient's care.  08/11/21, PharmD Pharmacy Resident  06/11/2021 6:00 PM

## 2021-06-11 NOTE — ED Notes (Signed)
US @ the bedside. 

## 2021-06-11 NOTE — Progress Notes (Signed)
Nurse reported patient unable to swallow liquid Acyclovir.  Consult pharmacy for IV Acyclovir, consult speech in AM.

## 2021-06-11 NOTE — ED Notes (Signed)
Pt reporting SOB. PRN nebulizer treatment initiated. NP Jon Billings, called to update.

## 2021-06-11 NOTE — ED Notes (Signed)
NP Jon Billings consulted due to concern of pts condition and current orders.

## 2021-06-11 NOTE — H&P (Addendum)
History and Physical    Megan [name] SEG:[id number] DOB: 30-Nov-1970 DOA: 06/11/2021  [facility]P: [facility] (Confirm with patient/family/NH records and if not entered, this has to be entered at [facility] point of entry) Patient coming from: Home  I have personally briefly reviewed patient's old medical records in [facility]  Chief Complaint: Left sided face and scalp pain and swelling and rash.  HPI: Megan [name] is a 50 y.o. female with medical history significant of hypothyroid, mild intermittent asthma, obesity, came with worsening of left sided face/scalp swelling pain and rash.   Her symptoms started on last Saturday, initially was numbness tingling sensation on the left side of face scalp and lips, she came to the ED, work-up including CT scan and MRI negative for acute findings.  And patient was reassured and sent home.  Over the last 2 days, patient symptoms started to get worse, she developed blisters  and rash on left-sided face and lips, with extreme sharp pain, and yesterday her lips and throat became swollen.  She went to see her PCP, who diagnosed her with herpes zoster, started her on Valcyclovir and steroid.  She went home, however, she was not able to swallow acyclovir pills or the tramadol, but she was able to swallow much smaller pills.  She also complains about light sensitivity on the left eye and swelling and pain of left eye.  Since yesterday, she also had feeling of her left ear cannel started to tingling, but she denied any hearing changes or ear ringing. No cough, no stridor, no SOB.  ED Course: Patient only tolerate ValTrax, liquid form of acyclovir started in ED. fluorescein dye showed several left cornea lesions and ophthalmology Dr. Brooke Dare was contacted recommend continue acyclovir treatment and ABX eye ointment.  Review of Systems: As per HPI otherwise 14 point review of systems negative.    Past Medical History:  Diagnosis Date    Anxiety    Asthma    COVID-19 virus infection 08/2020   Hypothyroidism    Morbid obesity (HCC)     Past Surgical History:  Procedure Laterality Date   BREAST BIOPSY Right 11/11/2020   [place] bx, vision marker, - FRAGMENTS OF BENIGN INTRAMAMMARY LYMPH NODE.  NEGATIVE FOR ATYPICAL PROLIFERATIVE BREAST DISEASE.   BREAST BIOPSY Left 11/11/2020`   [place] bx, Q shape, FIBROEPITHELIAL PROLIFERATION WITH SCLEROSIS. NEGATIVE FOR ATYPICAL PROLIFERATIVE BREAST DISEASE IN THIS SAMPLE.    BREAST BIOPSY Left 12/03/2020   [place] bx, vision clip, fiboadenoma   BREAST BIOPSY Left 12/03/2020   [place] bx, x-clip, fibroadenoma   BREAST BIOPSY Left 02/03/2021   Procedure: BREAST BIOPSY WITH NEEDLE LOCALIZATION;  Surgeon: [name], MD;  Location: [facility] ORS;  Service: General;  Laterality: Left;   BREAST LUMPECTOMY Left 02/03/2021   LT 2:00 X clip NL done prior to lumpectomy    CESAREAN SECTION     FOOT SURGERY     bone spur   TUBAL LIGATION       reports that she has never smoked. She has never used smokeless tobacco. She reports that she does not drink alcohol and does not use drugs.  No Known Allergies  Family History  Problem Relation Age of Onset   Breast cancer Maternal Grandmother        great grandmother     Prior to Admission medications   Medication Sig Start Date End Date Taking? Authorizing Provider  Albuterol Sulfate, sensor, (PROAIR DIGIHALER) 108 (90 Base) MCG/ACT AEPB  Inhale 2 puffs into the lungs every 4 (four) hours.    [provider]  levothyroxine (SYNTHROID) 88 MCG tablet Take 88 mcg by mouth daily before breakfast. 05/08/21   [provider]  loratadine (CLARITIN) 10 MG tablet Take 10 mg by mouth daily as needed for allergies.    [provider]  PARoxetine (PAXIL) 20 MG tablet Take 20 mg by mouth daily.    [provider]  predniSONE (DELTASONE) 10 MG tablet Take 40 mg by mouth daily. 06/10/21   [provider]  traMADol (ULTRAM) 50 MG  tablet Take 1 tablet (50 mg total) by mouth every 4 (four) hours as needed. 02/03/21 02/03/22  [name], MD  traZODone (DESYREL) 50 MG tablet Take 50 mg by mouth at bedtime as needed for sleep.    [provider]  valACYclovir (VALTREX) 1000 MG tablet Take 1,000 mg by mouth 3 (three) times daily. 06/10/21   [provider]    Physical Exam: Vitals:   06/11/21 1128 06/11/21 1456  BP: 135/83 (!) 147/91  Pulse: 93 78  Resp: 18 18  Temp: 98.4 F (36.9 C)   TempSrc: Oral   SpO2: 99% 96%  Weight: 91 kg   Height: 5\' 5"  (1.651 m)     Constitutional: NAD, calm, comfortable Vitals:   06/11/21 1128 06/11/21 1456  BP: 135/83 (!) 147/91  Pulse: 93 78  Resp: 18 18  Temp: 98.4 F (36.9 C)   TempSrc: Oral   SpO2: 99% 96%  Weight: 91 kg   Height: 5\' 5"  (1.651 m)    Eyes: PERRL, lids and conjunctivae normal.  Left sided face rash and swelling, multiple crusted lesions on lips and left face, small blisters on left forehead/lips, nose bridge not crossing midline ENMT: Mucous membranes are moist. Posterior pharynx clear of any exudate or lesions.Normal dentition.  Neck: normal, supple, no masses, no thyromegaly Respiratory: clear to auscultation bilaterally, no wheezing, no crackles. Normal respiratory effort. No accessory muscle use.  Cardiovascular: Regular rate and rhythm, no murmurs / rubs / gallops. No extremity edema. 2+ pedal pulses. No carotid bruits.  Abdomen: no tenderness, no masses palpated. No hepatosplenomegaly. Bowel sounds positive.  Musculoskeletal: no clubbing / cyanosis. No joint deformity upper and lower extremities. Good ROM, no contractures. Normal muscle tone.  Skin: no rashes, lesions, ulcers. No induration Neurologic: CN 2-12 grossly intact. Sensation intact, DTR normal. Strength 5/5 in all 4.  Psychiatric: Normal judgment and insight. Alert and oriented x 3. Normal mood.      Labs on Admission: I have personally reviewed following labs and  imaging studies  CBC: Recent Labs  Lab 06/07/21 2350 06/11/21 1245  WBC 5.4 7.0  NEUTROABS 1.9 3.5  HGB 9.2* 10.1*  HCT 30.5* 32.9*  MCV 74.2* 72.8*  PLT 191 211   Basic Metabolic Panel: Recent Labs  Lab 06/07/21 2350 06/11/21 1322  NA 137 137  K 3.9 3.9  CL 106 103  CO2 27 25  GLUCOSE 107* 98  BUN 14 11  CREATININE 0.91 0.90  CALCIUM 9.2 9.7   GFR: Estimated Creatinine Clearance: 85.2 mL/min (by C-G formula based on SCr of 0.9 mg/dL). Liver Function Tests: Recent Labs  Lab 06/07/21 2350  AST 108*  ALT 77*  ALKPHOS 59  BILITOT 0.8  PROT 8.5*  ALBUMIN 3.7   No results for input(s): LIPASE, AMYLASE in the last 168 hours. No results for input(s): AMMONIA in the last 168 hours. Coagulation Profile: Recent Labs  Lab 06/07/21 2350  INR 1.0   Cardiac Enzymes: No results for input(s): CKTOTAL, CKMB, CKMBINDEX, TROPONINI in the last 168 hours. BNP (last 3 results) No results for input(s): PROBNP in the last 8760 hours. HbA1C: No results for input(s): HGBA1C in the last 72 hours. CBG: Recent Labs  Lab 06/08/21 0134  GLUCAP 102*   Lipid Profile: No results for input(s): CHOL, HDL, LDLCALC, TRIG, CHOLHDL, LDLDIRECT in the last 72 hours. Thyroid Function Tests: No results for input(s): TSH, T4TOTAL, FREET4, T3FREE, THYROIDAB in the last 72 hours. Anemia Panel: No results for input(s): VITAMINB12, FOLATE, FERRITIN, TIBC, IRON, RETICCTPCT in the last 72 hours. Urine analysis:    Component Value Date/Time   COLORURINE YELLOW (A) 09/29/2015 1534   APPEARANCEUR CLEAR (A) 09/29/2015 1534   APPEARANCEUR Hazy 09/07/2013 1644   LABSPEC 1.021 09/29/2015 1534   LABSPEC 1.020 09/07/2013 1644   PHURINE 5.0 09/29/2015 1534   GLUCOSEU NEGATIVE 09/29/2015 1534   GLUCOSEU Negative 09/07/2013 1644   HGBUR 3+ (A) 09/29/2015 1534   BILIRUBINUR NEGATIVE 09/29/2015 1534   BILIRUBINUR Negative 09/07/2013 1644   KETONESUR NEGATIVE 09/29/2015 1534   PROTEINUR NEGATIVE  09/29/2015 1534   NITRITE NEGATIVE 09/29/2015 1534   LEUKOCYTESUR NEGATIVE 09/29/2015 1534   LEUKOCYTESUR Negative 09/07/2013 1644    Radiological Exams on Admission: No results found.  EKG: Independently reviewed. Sinus, no acute ST-T changes.  Assessment/Plan Active Problems:   Herpes zoster dermatitis with ophthalmic complication  (please populate well all problems here in Problem List. (For example, if patient is on BP meds at home and you resume or decide to hold them, it is a problem that needs to be her. 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  Currently patient not tolerating large pills, switch to liquid form of acyclovir 5 times daily x7 days.  As per ophthalmology recommendation, erythromycin ointment will be used. Ophthalmology to see patient in AM. -None of the facial lesions are crossing midline indicating non-disseminated, and lesions on her lips and face already crusted. No need for contact isolation. -Pain control, IVF to prevent dehydration. -HIV, UDS  Acute dysphagia -Reported having trouble swallowing solid food and large pills. Likely related to the lips and buccal mucosa lesions. -Liquid diet. -Phenol spray -Speech evaluation in AM.  Elevated LFTs -Isolated finding -According literature, herpes zoster hepatitis is rare. -Recheck LFT today and trend, ordered acute hepatitis panel. -RUQ U/S.  Asthma/Depression/Hypothyroid/Chronic microcytic anemia -No issue. Home meds.  DVT prophylaxis: SCD Code Status: Full code Family Communication: None at bedside Disposition Plan: Expect more than 2 midnight hospital stay Consults called: Opth Dr. Brooke Dare Admission status: MedSurg admit   Emeline General MD [facility] Pager [phone]  06/11/2021, 3:08 PM

## 2021-06-12 ENCOUNTER — Encounter: Payer: Self-pay | Admitting: Internal Medicine

## 2021-06-12 DIAGNOSIS — B0089 Other herpesviral infection: Secondary | ICD-10-CM

## 2021-06-12 DIAGNOSIS — L303 Infective dermatitis: Secondary | ICD-10-CM

## 2021-06-12 LAB — VITAMIN D 25 HYDROXY (VIT D DEFICIENCY, FRACTURES): Vit D, 25-Hydroxy: 20.13 ng/mL — ABNORMAL LOW (ref 30–100)

## 2021-06-12 LAB — HEPATIC FUNCTION PANEL
ALT: 65 U/L — ABNORMAL HIGH (ref 0–44)
ALT: 70 U/L — ABNORMAL HIGH (ref 0–44)
AST: 80 U/L — ABNORMAL HIGH (ref 15–41)
AST: 82 U/L — ABNORMAL HIGH (ref 15–41)
Albumin: 3.5 g/dL (ref 3.5–5.0)
Albumin: 3.6 g/dL (ref 3.5–5.0)
Alkaline Phosphatase: 45 U/L (ref 38–126)
Alkaline Phosphatase: 50 U/L (ref 38–126)
Bilirubin, Direct: 0.1 mg/dL (ref 0.0–0.2)
Bilirubin, Direct: <0.1 mg/dL (ref 0.0–0.2)
Indirect Bilirubin: 0.9 mg/dL (ref 0.3–0.9)
Total Bilirubin: 1 mg/dL (ref 0.3–1.2)
Total Bilirubin: 1.1 mg/dL (ref 0.3–1.2)
Total Protein: 8 g/dL (ref 6.5–8.1)
Total Protein: 8.4 g/dL — ABNORMAL HIGH (ref 6.5–8.1)

## 2021-06-12 LAB — HEPATITIS PANEL, ACUTE
HCV Ab: NONREACTIVE
Hep A IgM: NONREACTIVE
Hep B C IgM: NONREACTIVE
Hepatitis B Surface Ag: NONREACTIVE

## 2021-06-12 LAB — BASIC METABOLIC PANEL
Anion gap: 6 (ref 5–15)
BUN: 13 mg/dL (ref 6–20)
CO2: 24 mmol/L (ref 22–32)
Calcium: 9 mg/dL (ref 8.9–10.3)
Chloride: 104 mmol/L (ref 98–111)
Creatinine, Ser: 0.75 mg/dL (ref 0.44–1.00)
GFR, Estimated: >60 mL/min (ref >60)
Glucose, Bld: 144 mg/dL — ABNORMAL HIGH (ref 70–99)
Potassium: 3.9 mmol/L (ref 3.5–5.1)
Sodium: 134 mmol/L — ABNORMAL LOW (ref 135–145)

## 2021-06-12 LAB — TSH: TSH: 0.665 u[IU]/mL (ref 0.350–4.500)

## 2021-06-12 LAB — IRON AND TIBC
Iron: 30 ug/dL (ref 28–170)
Saturation Ratios: 6 % — ABNORMAL LOW (ref 10.4–31.8)
TIBC: 500 ug/dL — ABNORMAL HIGH (ref 250–450)
UIBC: 470 ug/dL

## 2021-06-12 LAB — CBC
HCT: 30.5 % — ABNORMAL LOW (ref 36.0–46.0)
Hemoglobin: 9.7 g/dL — ABNORMAL LOW (ref 12.0–15.0)
MCH: 23.4 pg — ABNORMAL LOW (ref 26.0–34.0)
MCHC: 31.8 g/dL (ref 30.0–36.0)
MCV: 73.5 fL — ABNORMAL LOW (ref 80.0–100.0)
Platelets: 229 10*3/uL (ref 150–400)
RBC: 4.15 MIL/uL (ref 3.87–5.11)
RDW: 18.3 % — ABNORMAL HIGH (ref 11.5–15.5)
WBC: 7.1 10*3/uL (ref 4.0–10.5)
nRBC: 0 % (ref 0.0–0.2)

## 2021-06-12 LAB — FOLATE: Folate: 11.2 ng/mL (ref >5.9)

## 2021-06-12 LAB — VITAMIN B12: Vitamin B-12: 260 pg/mL (ref 180–914)

## 2021-06-12 LAB — HIV ANTIBODY (ROUTINE TESTING W REFLEX): HIV Screen 4th Generation wRfx: REACTIVE — AB

## 2021-06-12 MED ORDER — CYANOCOBALAMIN 500 MCG PO TABS: 500.0000 ug | ORAL_TABLET | Freq: Every day | ORAL | Status: DC

## 2021-06-12 MED ORDER — POLYSACCHARIDE IRON COMPLEX 150 MG PO CAPS
150.0000 mg | ORAL_CAPSULE | Freq: Every day | ORAL | Status: DC
  Administered 2021-06-12 – 2021-06-18 (×7): 150 mg via ORAL
  Filled 2021-06-12 (×8): qty 1

## 2021-06-12 MED ORDER — ERYTHROMYCIN 5 MG/GM OP OINT
TOPICAL_OINTMENT | Freq: Three times a day (TID) | OPHTHALMIC | Status: DC
  Filled 2021-06-12: qty 3.5

## 2021-06-12 MED ORDER — ENSURE ENLIVE PO LIQD
237.0000 mL | Freq: Three times a day (TID) | ORAL | Status: DC
  Administered 2021-06-13 – 2021-06-18 (×12): 237 mL via ORAL

## 2021-06-12 MED ORDER — ASCORBIC ACID 500 MG PO TABS
500.0000 mg | ORAL_TABLET | Freq: Every day | ORAL | Status: DC
  Administered 2021-06-13 – 2021-06-18 (×6): 500 mg via ORAL
  Filled 2021-06-12 (×6): qty 1

## 2021-06-12 MED ORDER — CYANOCOBALAMIN 1000 MCG/ML IJ SOLN
1000.0000 ug | Freq: Every day | INTRAMUSCULAR | Status: DC
  Administered 2021-06-13 – 2021-06-18 (×6): 1000 ug via INTRAMUSCULAR
  Filled 2021-06-12 (×7): qty 1

## 2021-06-12 MED ORDER — ENOXAPARIN SODIUM 40 MG/0.4ML IJ SOSY
40.0000 mg | PREFILLED_SYRINGE | INTRAMUSCULAR | Status: DC
  Administered 2021-06-12 – 2021-06-15 (×4): 40 mg via SUBCUTANEOUS
  Filled 2021-06-12 (×3): qty 0.4

## 2021-06-12 NOTE — ED Notes (Signed)
Informed pt and family that SLP will see pt around 12pm. Pt wants to attempt sips of water now.

## 2021-06-12 NOTE — Progress Notes (Signed)
[facility] Progress Note  Patient: Megan [name]    MGQ:[id number]  DOA: 06/11/2021     Date of Service: the patient was seen and examined on 06/12/2021  Chief Complaint  Patient presents with   Rash   Brief hospital course: KAMALA KOLTON is a 50 y.o. female with medical history significant of hypothyroid, mild intermittent asthma, obesity, came with worsening of left sided face/scalp swelling pain and rash.    Her symptoms started on last Saturday, initially was numbness tingling sensation on the left side of face scalp and lips, she came to the ED, work-up including CT scan and MRI negative for acute findings.  And patient was reassured and sent home.  Over the last 2 days, patient symptoms started to get worse, she developed blisters  and rash on left-sided face and lips, with extreme sharp pain, and yesterday her lips and throat became swollen.  She went to see her PCP, who diagnosed her with herpes zoster, started her on Valcyclovir and steroid.  She went home, however, she was not able to swallow acyclovir pills or the tramadol, but she was able to swallow much smaller pills.  She also complains about light sensitivity on the left eye and swelling and pain of left eye.  Since yesterday, she also had feeling of her left ear cannel started to tingling, but she denied any hearing changes or ear ringing. No cough, no stridor, no SOB.   ED Course: Patient only tolerate ValTrax, liquid form of acyclovir started in ED. fluorescein dye showed several left cornea lesions and ophthalmology Dr. Brooke Dare was contacted recommend continue acyclovir treatment and ABX eye ointment.    Assessment and Plan: Active Problems:   Herpes zoster dermatitis with ophthalmic complication    Trigeminal herpes zoster, herpes zoster ophthalmicus (V1) and maxillary (V2) -With cornea lesions, ophthalmology was consulted, recommended systemic antiviral treatment.  Currently patient not tolerating large pills,  switch to liquid form of acyclovir 5 times daily x7 days.   As per ophthalmology recommendation, erythromycin ointment will be used. Ophthalmology to see patient in AM. -None of the facial lesions are crossing midline indicating non-disseminated, and lesions on her lips and face already crusted. No need for contact isolation. -Pain control, IVF to prevent dehydration. -HIV, UDS Continue Toradol IV for pain control and Solu-Medrol 40 every 12 hourly   Acute dysphagia -Reported having trouble swallowing solid food and large pills. Likely related to the lips and buccal mucosa lesions. -Liquid diet. -Phenol spray -Speech evaluation done recommended pured diet   Elevated LFTs -Isolated finding -According literature, herpes zoster hepatitis is rare. -Recheck LFT and trend, ordered acute hepatitis panel. -RUQ U/S. Fatty liver with fatty sparing. Negative for gallstones or biliary dilatation   Asthma/Depression/Hypothyroid/Chronic microcytic anemia -No issue. Home meds.  Iron deficiency, iron saturation 6%, started Niferex oral iron supplement.  Follow with PCP to repeat iron profile after 3 to 6 months. Vitamin B12 level 260, target >400, started vitamin B12 1000 mcg IM injection during hospital stay and oral supplement on discharge.   Body mass index is 33.38 kg/m.  Interventions:       Diet: Pured DVT Prophylaxis: Subcutaneous Lovenox   Advance goals of care discussion: Full code  Family Communication: family was NOT present at bedside, at the time of interview.  The pt provided permission to discuss medical plan with the family. Opportunity was given to ask question and all questions were answered satisfactorily.   Disposition:  Pt is from Home,  admitted with swelling of face and severe pain due to shingles, still has swelling and pain, which precludes a safe discharge. Discharge to home, when when clinically improves, may require 3 to 5 days.  Subjective: Patient was  admitted secondary to severe swelling of left-sided face and severe pain due to shingles, still feels pain 9/10, burning sensation over the left side of the forehead.  Patient is unable to open her mouth completely and unable to take any solid diet, We will continue current treatment and monitor closely.   Physical Exam: General:  alert oriented to time, place, and person.  Appear in moderate distress, affect appropriate Eyes: Right eye EOMI, left eye blurry vision and close eyelid swelling and crusting ENT: Oral mucosa unable to see lesions patient was not able to open completely Neck: no JVD,  Cardiovascular: S1 and S2 Present, no Murmur,  Respiratory: good respiratory effort, Bilateral Air entry equal and Decreased, no Crackles, no wheezes Abdomen: Bowel Sound present, Soft and no tenderness,  Skin: Left-sided face swelling, some crusted lesions noticed on the left side of the left.  No visible vesicles Extremities: no Pedal edema, no calf tenderness Neurologic: without any new focal findings Gait not checked due to patient safety concerns  Vitals:   06/12/21 1300 06/12/21 1455 06/12/21 1500 06/12/21 1515  BP: (!) 146/87 120/90 129/86   Pulse: 74 92 89 72  Resp: 18 17 18 20   Temp:  98.8 F (37.1 C)    TempSrc:  Oral    SpO2: 99% 98% 97% 95%  Weight:      Height:        Intake/Output Summary (Last 24 hours) at 06/12/2021 1609 Last data filed at 06/12/2021 06/14/2021 Gross per 24 hour  Intake 1150 ml  Output --  Net 1150 ml   Filed Weights   06/11/21 1128  Weight: 91 kg    Data Reviewed: I have personally reviewed and interpreted daily labs, tele strips, imagings as discussed above. I reviewed all nursing notes, pharmacy notes, vitals, pertinent old records I have discussed plan of care as described above with RN and patient/family.  CBC: Recent Labs  Lab 06/07/21 2350 06/11/21 1245 06/12/21 0657  WBC 5.4 7.0 7.1  NEUTROABS 1.9 3.5  --   HGB 9.2* 10.1* 9.7*  HCT  30.5* 32.9* 30.5*  MCV 74.2* 72.8* 73.5*  PLT 191 211 229   Basic Metabolic Panel: Recent Labs  Lab 06/07/21 2350 06/11/21 1322 06/12/21 0657  NA 137 137 134*  K 3.9 3.9 3.9  CL 106 103 104  CO2 27 25 24   GLUCOSE 107* 98 144*  BUN 14 11 13   CREATININE 0.91 0.90 0.75  CALCIUM 9.2 9.7 9.0    Studies: 06/14/21 Abdomen Limited RUQ (LIVER/GB)  Result Date: 06/11/2021 CLINICAL DATA:  Elevated LFT EXAM: ULTRASOUND ABDOMEN LIMITED RIGHT UPPER QUADRANT COMPARISON:  None. FINDINGS: Gallbladder: No gallstones or wall thickening visualized. No sonographic Murphy sign noted by sonographer. Common bile duct: Diameter: 5.0 mm Liver: Fatty liver with fatty sparing adjacent to the falciform ligament. No mass lesion. Portal vein is patent on color Doppler imaging with normal direction of blood flow towards the liver. Other: None. IMPRESSION: Fatty liver with fatty sparing. Negative for gallstones or biliary dilatation. Electronically Signed   By: M.D.   On: 06/11/2021 16:47    Scheduled Meds:  vitamin C  500 mg Oral Daily   dexamethasone  2 drop Both Eyes Q4H   erythromycin   Left  Eye Q8H   feeding supplement  237 mL Oral TID BM   iron polysaccharides  150 mg Oral Daily   levothyroxine  88 mcg Oral QAC breakfast   methylPREDNISolone (SOLU-MEDROL) injection  40 mg Intravenous Q12H   PARoxetine  20 mg Oral Daily   Continuous Infusions:  sodium chloride Stopped (06/12/21 0924)   acyclovir Stopped (06/12/21 0804)   PRN Meds: albuterol, docusate, fentaNYL (SUBLIMAZE) injection, ketorolac, lidocaine, loratadine, oxyCODONE, phenol, traZODone  Time spent: 35 minutes  Author: Gillis Santa. MD [facility] 06/12/2021 4:09 PM  To reach On-call, see care teams to locate the attending and reach out to them via [email or web address]. If 7PM-7AM, please contact night-coverage If you still have difficulty reaching the attending provider, please page the [facility] (Director on Call) for [facility] on amion for assistance.

## 2021-06-12 NOTE — ED Notes (Signed)
Pt complaining that her assigned bed was taken away last night and stating she will "make a complaint about the night nurse" because she "overrode" the bed assignment and had bed taken away. Explained to pt that the night nurse was not responsible for bed being taken away and there was a safety reason for not sending pt to medsurg floor because airway was possibly compromised. Informed Dr Lucianne Muss. Pt also asking to eat and drink but states could not swallow last night.

## 2021-06-12 NOTE — Consult Note (Signed)
Reason for Consult:Herpes zoster ophthalmicus, ocular involvement Referring Physician: Dr. Katrinka Blazing, [facility] ED MD Chief complaint: eye pain, blurry vision  HPI: Megan [name] is an 50 y.o. female admitted for left sided facial rash and pain found to be herpes zoster in the V1 and V2 distribution.  She sought care in the ED as her pain worsened and she experienced difficulty swallowing.  She has been admitted to the hospital and is on IV acyclovir.  Ophthalmology was consulted because of eyelid and ocular involvement.  She complains of tenderness and pain on the left side of her face and eyelids, as well as mild blurry vision in the left eye.  She reports that she is near sighted and usually wears glasses for distance vision.   Past Medical History:  Diagnosis Date   Anxiety    Asthma    COVID-19 virus infection 08/2020   Hypothyroidism    Morbid obesity (HCC)     ROS  Past Surgical History:  Procedure Laterality Date   BREAST BIOPSY Right 11/11/2020   [place] bx, vision marker, - FRAGMENTS OF BENIGN INTRAMAMMARY LYMPH NODE.  NEGATIVE FOR ATYPICAL PROLIFERATIVE BREAST DISEASE.   BREAST BIOPSY Left 11/11/2020`   [place] bx, Q shape, FIBROEPITHELIAL PROLIFERATION WITH SCLEROSIS. NEGATIVE FOR ATYPICAL PROLIFERATIVE BREAST DISEASE IN THIS SAMPLE.    BREAST BIOPSY Left 12/03/2020   [place] bx, vision clip, fiboadenoma   BREAST BIOPSY Left 12/03/2020   [place] bx, x-clip, fibroadenoma   BREAST BIOPSY Left 02/03/2021   Procedure: BREAST BIOPSY WITH NEEDLE LOCALIZATION;  Surgeon: [name], MD;  Location: [facility] ORS;  Service: General;  Laterality: Left;   BREAST LUMPECTOMY Left 02/03/2021   LT 2:00 X clip NL done prior to lumpectomy    CESAREAN SECTION     FOOT SURGERY     bone spur   TUBAL LIGATION      Family History  Problem Relation Age of Onset   Breast cancer Maternal Grandmother        great grandmother    Social History:  reports that she has never smoked. She has never used smokeless  tobacco. She reports that she does not drink alcohol and does not use drugs.  Allergies: No Known Allergies  Prior to Admission medications   Medication Sig Start Date End Date Taking? Authorizing Provider  levothyroxine (SYNTHROID) 88 MCG tablet Take 88 mcg by mouth daily before breakfast. 05/08/21  Yes [provider]  loratadine (CLARITIN) 10 MG tablet Take 10 mg by mouth daily as needed for allergies.   Yes [provider]  PARoxetine (PAXIL) 20 MG tablet Take 20 mg by mouth daily.   Yes [provider]  predniSONE (DELTASONE) 10 MG tablet Take 40 mg by mouth daily. 06/10/21  Yes [provider]  traZODone (DESYREL) 50 MG tablet Take 50 mg by mouth at bedtime as needed for sleep.   Yes [provider]  valACYclovir (VALTREX) 1000 MG tablet Take 1,000 mg by mouth 3 (three) times daily. 06/10/21  Yes [provider]  Albuterol Sulfate, sensor, (PROAIR DIGIHALER) 108 (90 Base) MCG/ACT AEPB Inhale 2 puffs into the lungs every 4 (four) hours.    [provider]  traMADol (ULTRAM) 50 MG tablet Take 1 tablet (50 mg total) by mouth every 4 (four) hours as needed. Patient not taking: Reported on 06/11/2021 02/03/21 02/03/22  [name], MD    Results for orders placed or performed during the hospital encounter of 06/11/21 (from the past 48  hour(s))  CBC with Differential/Platelet     Status: Abnormal   Collection Time: 06/11/21 12:45 PM  Result Value Ref Range   WBC 7.0 4.0 - 10.5 K/uL   RBC 4.52 3.87 - 5.11 MIL/uL   Hemoglobin 10.1 (L) 12.0 - 15.0 g/dL   HCT 83.2 (L) 54.9 - 82.6 %   MCV 72.8 (L) 80.0 - 100.0 fL   MCH 22.3 (L) 26.0 - 34.0 pg   MCHC 30.7 30.0 - 36.0 g/dL   RDW 41.5 (H) 83.0 - 94.0 %   Platelets 211 150 - 400 K/uL   nRBC 0.0 0.0 - 0.2 %   Neutrophils Relative % 51 %   Neutro Abs 3.5 1.7 - 7.7 K/uL   Lymphocytes Relative 39 %   Lymphs Abs 2.7 0.7 - 4.0 K/uL   Monocytes Relative 10 %   Monocytes Absolute 0.7  0.1 - 1.0 K/uL   Eosinophils Relative 0 %   Eosinophils Absolute 0.0 0.0 - 0.5 K/uL   Basophils Relative 0 %   Basophils Absolute 0.0 0.0 - 0.1 K/uL   Immature Granulocytes 0 %   Abs Immature Granulocytes 0.03 0.00 - 0.07 K/uL    Comment: Performed at [facility], [address] [place], [place] [address]  Resp Panel by RT-PCR (Flu A&B, Covid) Nasopharyngeal Swab     Status: None   Collection Time: 06/11/21 12:45 PM   Specimen: Nasopharyngeal Swab; Nasopharyngeal(NP) swabs in vial transport medium  Result Value Ref Range   SARS Coronavirus 2 by RT PCR NEGATIVE NEGATIVE    Comment: (NOTE) SARS-CoV-2 target nucleic acids are NOT DETECTED.  The SARS-CoV-2 RNA is generally detectable in upper respiratory specimens during the acute phase of infection. The lowest concentration of SARS-CoV-2 viral copies this assay can detect is 138 copies/mL. A negative result does not preclude SARS-Cov-2 infection and should not be used as the sole basis for treatment or other patient management decisions. A negative result may occur with  improper specimen collection/handling, submission of specimen other than nasopharyngeal swab, presence of viral mutation(s) within the areas targeted by this assay, and inadequate number of viral copies(<138 copies/mL). A negative result must be combined with clinical observations, patient history, and epidemiological information. The expected result is Negative.  Fact Sheet for Patients:  BloggerCourse.com  Fact Sheet for Healthcare Providers:  SeriousBroker.it  This test is no t yet approved or cleared by the [place] FDA and  has been authorized for detection and/or diagnosis of SARS-CoV-2 by FDA under an Emergency Use Authorization (EUA). This EUA will remain  in effect (meaning this test can be used) for the duration of the COVID-19 declaration under Section 564(b)(1) of the Act,  21 U.S.C.section 360bbb-3(b)(1), unless the authorization is terminated  or revoked sooner.       Influenza A by PCR NEGATIVE NEGATIVE   Influenza B by PCR NEGATIVE NEGATIVE    Comment: (NOTE) The Xpert Xpress SARS-CoV-2/FLU/RSV plus assay is intended as an aid in the diagnosis of influenza from Nasopharyngeal swab specimens and should not be used as a sole basis for treatment. Nasal washings and aspirates are unacceptable for Xpert Xpress SARS-CoV-2/FLU/RSV testing.  Fact Sheet for Patients: BloggerCourse.com  Fact Sheet for Healthcare Providers: SeriousBroker.it  This test is not yet approved or cleared by the [place] FDA and has been authorized for detection and/or diagnosis of SARS-CoV-2 by FDA under an Emergency Use Authorization (EUA). This EUA will remain in effect (meaning this test can be used) for  the duration of the COVID-19 declaration under Section 564(b)(1) of the Act, 21 U.S.C. section 360bbb-3(b)(1), unless the authorization is terminated or revoked.  Performed at [facility], [address] [place], [place] [address]   Basic metabolic panel     Status: None   Collection Time: 06/11/21  1:22 PM  Result Value Ref Range   Sodium 137 135 - 145 mmol/L   Potassium 3.9 3.5 - 5.1 mmol/L   Chloride 103 98 - 111 mmol/L   CO2 25 22 - 32 mmol/L   Glucose, Bld 98 70 - 99 mg/dL    Comment: Glucose reference range applies only to samples taken after fasting for at least 8 hours.   BUN 11 6 - 20 mg/dL   Creatinine, Ser 4.09 0.44 - 1.00 mg/dL   Calcium 9.7 8.9 - 81.1 mg/dL   GFR, Estimated >91 >47 mL/min    Comment: (NOTE) Calculated using the CKD-EPI Creatinine Equation (2021)    Anion gap 9 5 - 15    Comment: Performed at [facility], [address] [place], [place] [address]  Hepatic function panel     Status: Abnormal   Collection Time: 06/11/21  1:22 PM  Result Value Ref Range    Total Protein 8.0 6.5 - 8.1 g/dL   Albumin 3.5 3.5 - 5.0 g/dL   AST 80 (H) 15 - 41 U/L   ALT 65 (H) 0 - 44 U/L   Alkaline Phosphatase 45 38 - 126 U/L   Total Bilirubin 1.0 0.3 - 1.2 mg/dL   Bilirubin, Direct 0.1 0.0 - 0.2 mg/dL   Indirect Bilirubin 0.9 0.3 - 0.9 mg/dL    Comment: Performed at [facility], [address] [place], [place] [address]  CK     Status: Abnormal   Collection Time: 06/11/21  1:22 PM  Result Value Ref Range   Total CK 33 (L) 38 - 234 U/L    Comment: Performed at [facility], [address] [place], [place] [address]  Basic metabolic panel     Status: Abnormal   Collection Time: 06/12/21  6:57 AM  Result Value Ref Range   Sodium 134 (L) 135 - 145 mmol/L   Potassium 3.9 3.5 - 5.1 mmol/L   Chloride 104 98 - 111 mmol/L   CO2 24 22 - 32 mmol/L   Glucose, Bld 144 (H) 70 - 99 mg/dL    Comment: Glucose reference range applies only to samples taken after fasting for at least 8 hours.   BUN 13 6 - 20 mg/dL   Creatinine, Ser 6.96 0.44 - 1.00 mg/dL   Calcium 9.0 8.9 - 29.5 mg/dL   GFR, Estimated >28 >41 mL/min    Comment: (NOTE) Calculated using the CKD-EPI Creatinine Equation (2021)    Anion gap 6 5 - 15    Comment: Performed at [facility], [address] [place], [place] [address]  CBC     Status: Abnormal   Collection Time: 06/12/21  6:57 AM  Result Value Ref Range   WBC 7.1 4.0 - 10.5 K/uL   RBC 4.15 3.87 - 5.11 MIL/uL   Hemoglobin 9.7 (L) 12.0 - 15.0 g/dL   HCT 10.2 (L) 72.5 - 36.6 %   MCV 73.5 (L) 80.0 - 100.0 fL   MCH 23.4 (L) 26.0 - 34.0 pg   MCHC 31.8 30.0 - 36.0 g/dL   RDW 44.0 (H) 34.7 - 42.5 %   Platelets 229 150 - 400 K/uL  nRBC 0.0 0.0 - 0.2 %    Comment: Performed at [facility], [address] [place], [place] [address]  Hepatic function panel     Status: Abnormal   Collection Time: 06/12/21  6:57 AM  Result Value Ref Range   Total Protein 8.4 (H) 6.5 - 8.1 g/dL   Albumin 3.6 3.5 - 5.0 g/dL    AST 82 (H) 15 - 41 U/L   ALT 70 (H) 0 - 44 U/L   Alkaline Phosphatase 50 38 - 126 U/L   Total Bilirubin 1.1 0.3 - 1.2 mg/dL   Bilirubin, Direct <7.0 0.0 - 0.2 mg/dL   Indirect Bilirubin NOT CALCULATED 0.3 - 0.9 mg/dL    Comment: Performed at [facility], [address] [place], [place] [address]  HIV Antibody (routine testing w rflx)     Status: Abnormal   Collection Time: 06/12/21  6:57 AM  Result Value Ref Range   HIV Screen 4th Generation wRfx Reactive (A) Non Reactive    Comment: RESULT REPEATED AND VERIFIED (NOTE) Reactive result does not distinguish HIV-1 p24 antigen, HIV-1  antibody, HIV-2 antibody, and HIV-1 group O antibody. Results  reactive by HIV Antigen/Antibody EIA must be confirmed. Sent for  confirmation. Performed at [facility], [address] [place], [place] [address]   Hepatitis panel, acute     Status: None   Collection Time: 06/12/21  6:57 AM  Result Value Ref Range   Hepatitis B Surface Ag NON REACTIVE NON REACTIVE   HCV Ab NON REACTIVE NON REACTIVE    Comment: (NOTE) Nonreactive HCV antibody screen is consistent with no HCV infections,  unless recent infection is suspected or other evidence exists to indicate HCV infection.     Hep A IgM NON REACTIVE NON REACTIVE   Hep B C IgM NON REACTIVE NON REACTIVE    Comment: Performed at [facility], [address], [place], [place] [address]  Vitamin B12     Status: None   Collection Time: 06/12/21  6:57 AM  Result Value Ref Range   Vitamin B-12 260 180 - 914 pg/mL    Comment: (NOTE) This assay is not validated for testing neonatal or myeloproliferative syndrome specimens for Vitamin B12 levels. Performed at [facility], [address] [place], [place] [address]   Folate     Status: None   Collection Time: 06/12/21  6:57 AM  Result Value Ref Range   Folate 11.2 >5.9 ng/mL    Comment: Performed at [facility], [address] [place], [place] [address]   Iron and TIBC     Status: Abnormal   Collection Time: 06/12/21  6:57 AM  Result Value Ref Range   Iron 30 28 - 170 ug/dL   TIBC 275 (H) 170 - 017 ug/dL   Saturation Ratios 6 (L) 10.4 - 31.8 %   UIBC 470 ug/dL    Comment: Performed at [facility] [address], [place], [place] [address]  VITAMIN D 25 Hydroxy (Vit-D Deficiency, Fractures)     Status: Abnormal   Collection Time: 06/12/21  6:57 AM  Result Value Ref Range   Vit D, 25-Hydroxy 20.13 (L) 30 - 100 ng/mL    Comment: (NOTE) Vitamin D deficiency has been defined by the Institute of Medicine  and an Endocrine Society practice guideline as a level of serum 25-OH  vitamin D less than 20 ng/mL (1,2). The Endocrine Society went on to  further define  vitamin D insufficiency as a level between 21 and 29  ng/mL (2).  1. IOM (Institute of Medicine). 2010. Dietary reference intakes for  calcium and D. [place]: [employer]. 2. Holick MF, Binkley Merom, Bischoff-Ferrari HA, et al. Evaluation,  treatment, and prevention of vitamin D deficiency: an Endocrine  Society clinical practice guideline, JCEM. 2011 Jul; 96(7): 1911-30.  Performed at [facility], [address] [place], [place] [address]   TSH     Status: None   Collection Time: 06/12/21  6:57 AM  Result Value Ref Range   TSH 0.665 0.350 - 4.500 uIU/mL    Comment: Performed by a 3rd Generation assay with a functional sensitivity of <=0.01 uIU/mL. Performed at [facility], [address] [place], [place] [address]     US Abdomen Limited RUQ (LIVER/GB)  Result Date: 06/11/2021 CLINICAL DATA:  Elevated LFT EXAM: ULTRASOUND ABDOMEN LIMITED RIGHT UPPER QUADRANT COMPARISON:  None. FINDINGS: Gallbladder: No gallstones or wall thickening visualized. No sonographic Murphy sign noted by sonographer. Common bile duct: Diameter: 5.0 mm Liver: Fatty liver with fatty sparing adjacent to the falciform ligament. No mass lesion. Portal vein  is patent on color Doppler imaging with normal direction of blood flow towards the liver. Other: None. IMPRESSION: Fatty liver with fatty sparing. Negative for gallstones or biliary dilatation. Electronically Signed   By: Marlan Palau M.D.   On: 06/11/2021 16:47    Blood pressure (!) 150/98, pulse 74, temperature 98.8 F (37.1 C), temperature source Oral, resp. rate 17, height 5\' 5"  (1.651 m), weight 91 kg, last menstrual period 05/23/2021, SpO2 98 %.  Mental status: Alert and Oriented x 4  Visual Acuity:  20/50 OD  20/70 near [place]  Pupils:  Equally round/ reactive to light.  No Afferent defect apparent, but examination is limited and challenging due to patient discomfort, effort and lid swelling. Motility:  Full/ orthophoric  Visual Fields:  grossly Full to confrontation  IOP:  grossly soft by palption  External/ Lids/ Lashes:  Normal on the right.  Left side has diffuse erythematous rash and papules across V1 and V2.  Anterior Segment:: limited portable slit lamp exam.   Conjunctiva:  Normal  OD, 1+ chemosis and hyperemia OS.  Cornea:  Normal OD, trace staining inferior OS.   Anterior Chamber: Grossly deep and quiet.  OU  Lens:   Good red reflex OU  Posterior Segment: not dilated.      Assessment/Plan:  Shingles.  Significant left upper and lower eyelid and generalized involvement.  Some corneal involvment.   Recommendations:  Continue IV acyclovir or other antiviral treatment per primary team.  Patient may also benefit from topical antibiotic ointment such as erthromycin ophthalmic ointment applied to left eye and eyelids 4 times daily and as needed for comfort.    Will continue to follow intermittently while inpatient, please contact me directly with questions or concerns.     [phone] 06/12/2021, 6:06 PM

## 2021-06-12 NOTE — ED Notes (Signed)
Pt is sleeping. Obtaining VS. Will administer eye drops when pt awakens.

## 2021-06-12 NOTE — ED Notes (Signed)
SLP was at bedside doing swallow study.  Dr Lucianne Muss was also just at bedside.

## 2021-06-12 NOTE — Progress Notes (Signed)
Clinical/Bedside Swallow Evaluation Patient Details  Name: BRANDACE CARGLE MRN: [id number] Date of Birth: 12-25-1970  Today's Date: 06/12/2021 Time: SLP Start Time (ACUTE ONLY): 1200 SLP Stop Time (ACUTE ONLY): 1235 SLP Time Calculation (min) (ACUTE ONLY): 35 min  Past Medical History:  Past Medical History:  Diagnosis Date   Anxiety    Asthma    COVID-19 virus infection 08/2020   Hypothyroidism    Morbid obesity (HCC)    Past Surgical History:  Past Surgical History:  Procedure Laterality Date   BREAST BIOPSY Right 11/11/2020   [place] bx, vision marker, - FRAGMENTS OF BENIGN INTRAMAMMARY LYMPH NODE.  NEGATIVE FOR ATYPICAL PROLIFERATIVE BREAST DISEASE.   BREAST BIOPSY Left 11/11/2020`   [place] bx, Q shape, FIBROEPITHELIAL PROLIFERATION WITH SCLEROSIS. NEGATIVE FOR ATYPICAL PROLIFERATIVE BREAST DISEASE IN THIS SAMPLE.    BREAST BIOPSY Left 12/03/2020   [place] bx, vision clip, fiboadenoma   BREAST BIOPSY Left 12/03/2020   [place] bx, x-clip, fibroadenoma   BREAST BIOPSY Left 02/03/2021   Procedure: BREAST BIOPSY WITH NEEDLE LOCALIZATION;  Surgeon: [name], MD;  Location: [facility] ORS;  Service: General;  Laterality: Left;   BREAST LUMPECTOMY Left 02/03/2021   LT 2:00 X clip NL done prior to lumpectomy    CESAREAN SECTION     FOOT SURGERY     bone spur   TUBAL LIGATION     HPI:  Dorann Davidson Dao is a 50 y.o. female with medical history significant of hypothyroid, mild intermittent asthma, obesity, came with worsening of left sided face/scalp swelling pain and rash.    Assessment / Plan / Recommendation  Clinical Impression  Pt presents with severe left facial swelling that encompassed her lip and progresses down her neck. She also exhibits significant trismus d/t jaw pain. As a result, she is barely unable to recieve ice chips from straw. Pt's swallow appeared swift and was free of overt s/s of aspiration. Pt's dysphagia appears to be directly related to her shingles and resultant facial  swelling. Extensive education provided to pt on this and recommendation for dysphagia 1 diet with thin liquids, medicine crushed in puree.  As swelling resolves, pt should advance diet as tolerated. Further ST intervention is not indicated. Pt, MD and RN aware of recommendation. Valla Pacey B. Dreama Saa M.S., CCC-SLP, [facility] Speech-Language Pathologist Rehabilitation Services Office [phone]   SLP Visit Diagnosis: Dysphagia, oropharyngeal phase (R13.12)    Aspiration Risk  Mild aspiration risk    Diet Recommendation Dysphagia 1 (Puree);Thin liquid   Liquid Administration via: Cup Medication Administration: Crushed with puree Supervision: Staff to assist with self feeding Compensations: Minimize environmental distractions;Slow rate;Small sips/bites Postural Changes: Seated upright at 90 degrees    Other  Recommendations Oral Care Recommendations: Oral care BID    Recommendations for follow up therapy are one component of a multi-disciplinary discharge planning process, led by the attending physician.  Recommendations may be updated based on patient status, additional functional criteria and insurance authorization.  Follow up Recommendations None      Frequency and Duration            Prognosis Barriers to Reach Goals:  (severity of facial and neck swelling)      Swallow Study   General Date of Onset: 06/11/21 HPI: Arryanna Holquin Regnier is a 50 y.o. female with medical history significant of hypothyroid, mild intermittent asthma, obesity, came with worsening of left sided face/scalp swelling pain and rash. Type of Study: Bedside Swallow Evaluation Previous Swallow Assessment: none in chart Diet  Prior to this Study: Thin liquids Temperature Spikes Noted: No Respiratory Status: Room air History of Recent Intubation: No Behavior/Cognition: Alert;Pleasant mood Oral Cavity Assessment: Dry Oral Care Completed by SLP: No Oral Cavity - Dentition: Adequate natural dentition Vision:   (impaired d/t swelling) Self-Feeding Abilities: Needs assist Patient Positioning: Upright in bed Baseline Vocal Quality: Low vocal intensity (d/t pain and reduced movement of articulators) Volitional Cough: Weak Volitional Swallow: Able to elicit    Oral/Motor/Sensory Function Overall Oral Motor/Sensory Function: Severe impairment (d/t signficant left facial swelling)   Ice Chips Ice chips: Within functional limits Presentation: Spoon   Thin Liquid Thin Liquid: Within functional limits Presentation: Cup    Nectar Thick Nectar Thick Liquid: Not tested   Honey Thick Honey Thick Liquid: Not tested   Puree Puree: Within functional limits Presentation: Spoon   Solid     Solid: Not tested      Salimatou Simone 06/12/2021,2:30 PM

## 2021-06-12 NOTE — Progress Notes (Addendum)
[place]  Patient is 50 year old spanish female who was admitted with shingles. She has significant oral,facial and eye lesions.  Fluorescein dye showed left corneal lesions and Dr. Brooke Dare with Ophthalmology was consulted. He recommended acyclovir treatment and antibiotic ointment.  Nurse contacted me secondary to patients reported increase in pain and swelling of head and neck affected areas.  Also accompanied by some increase work of breathing. Oxygen saturations remain stable of room air.  Did request a breathing treatment for increased work of breathing   Patent was started on fentanyl and oral viscous lidocaine for her severe pain related to the lesions.  Oral steroids changed to IV secondary to inability to safely swallow.  Due to worsening pain and lesions of left eye, decadron eye drops added to prevent keratitis.  Bed request changed to stepdown in order to provide closer airway monitoring

## 2021-06-13 ENCOUNTER — Inpatient Hospital Stay: Payer: Self-pay

## 2021-06-13 LAB — CBC
HCT: 31.1 % — ABNORMAL LOW (ref 36.0–46.0)
Hemoglobin: 9.3 g/dL — ABNORMAL LOW (ref 12.0–15.0)
MCH: 21.8 pg — ABNORMAL LOW (ref 26.0–34.0)
MCHC: 29.9 g/dL — ABNORMAL LOW (ref 30.0–36.0)
MCV: 72.8 fL — ABNORMAL LOW (ref 80.0–100.0)
Platelets: 228 10*3/uL (ref 150–400)
RBC: 4.27 MIL/uL (ref 3.87–5.11)
RDW: 18.4 % — ABNORMAL HIGH (ref 11.5–15.5)
WBC: 6.6 10*3/uL (ref 4.0–10.5)
nRBC: 0 % (ref 0.0–0.2)

## 2021-06-13 LAB — BASIC METABOLIC PANEL
Anion gap: 4 — ABNORMAL LOW (ref 5–15)
BUN: 15 mg/dL (ref 6–20)
CO2: 25 mmol/L (ref 22–32)
Calcium: 8.7 mg/dL — ABNORMAL LOW (ref 8.9–10.3)
Chloride: 107 mmol/L (ref 98–111)
Creatinine, Ser: 0.88 mg/dL (ref 0.44–1.00)
GFR, Estimated: >60 mL/min (ref >60)
Glucose, Bld: 94 mg/dL (ref 70–99)
Potassium: 3.8 mmol/L (ref 3.5–5.1)
Sodium: 136 mmol/L (ref 135–145)

## 2021-06-13 LAB — MAGNESIUM: Magnesium: 2 mg/dL (ref 1.7–2.4)

## 2021-06-13 LAB — PHOSPHORUS: Phosphorus: 3.1 mg/dL (ref 2.5–4.6)

## 2021-06-13 MED ORDER — VITAMIN D (ERGOCALCIFEROL) 1.25 MG (50000 UNIT) PO CAPS
50000.0000 [IU] | ORAL_CAPSULE | ORAL | Status: DC
  Administered 2021-06-13: 09:00:00 50000 [IU] via ORAL
  Filled 2021-06-13: qty 1

## 2021-06-13 NOTE — Progress Notes (Signed)
[facility] Progress Note  Patient: Megan [name]    TDD:[id number]  DOA: 06/11/2021     Date of Service: the patient was seen and examined on 06/13/2021  Chief Complaint  Patient presents with   Rash   Brief hospital course: Megan [name] is a 50 y.o. female with medical history significant of hypothyroid, mild intermittent asthma, obesity, came with worsening of left sided face/scalp swelling pain and rash.    Her symptoms started on last Saturday, initially was numbness tingling sensation on the left side of face scalp and lips, she came to the ED, work-up including CT scan and MRI negative for acute findings.  And patient was reassured and sent home.  Over the last 2 days, patient symptoms started to get worse, she developed blisters  and rash on left-sided face and lips, with extreme sharp pain, and yesterday her lips and throat became swollen.  She went to see her PCP, who diagnosed her with herpes zoster, started her on Valcyclovir and steroid.  She went home, however, she was not able to swallow acyclovir pills or the tramadol, but she was able to swallow much smaller pills.  She also complains about light sensitivity on the left eye and swelling and pain of left eye.  Since yesterday, she also had feeling of her left ear cannel started to tingling, but she denied any hearing changes or ear ringing. No cough, no stridor, no SOB.   ED Course: Patient only tolerate ValTrax, liquid form of acyclovir started in ED. fluorescein dye showed several left cornea lesions and ophthalmology Dr. Brooke Dare was contacted recommend continue acyclovir treatment and ABX eye ointment.    Assessment and Plan: Active Problems:   Herpes zoster dermatitis with ophthalmic complication    Trigeminal herpes zoster, herpes zoster ophthalmicus (V1) and maxillary (V2) -With cornea lesions, ophthalmology was consulted, recommended systemic antiviral treatment.  Currently patient not tolerating large pills,  switch to liquid form of acyclovir 5 times daily x7 days.   As per ophthalmology recommendation, erythromycin ointment will be used.  Seen by ophthalmology  -None of the facial lesions are crossing midline indicating non-disseminated, and lesions on her lips and face already crusted. No need for contact isolation. -Pain control, IVF to prevent dehydration. -HIV, UDS Continue Toradol IV for pain control and Solu-Medrol 40 every 12 hourly   Acute dysphagia -Reported having trouble swallowing solid food and large pills. Likely related to the lips and buccal mucosa lesions. -Liquid diet. -Phenol spray -Speech evaluation done recommended pured diet   Elevated LFTs -Isolated finding -According literature, herpes zoster hepatitis is rare. -Recheck LFT and trend, ordered acute hepatitis panel. -RUQ U/S. Fatty liver with fatty sparing. Negative for gallstones or biliary dilatation   Asthma/Depression/Hypothyroid/Chronic microcytic anemia -No issue. Home meds.  Iron deficiency, iron saturation 6%, started Niferex oral iron supplement.  Follow with PCP to repeat iron profile after 3 to 6 months. Vitamin B12 level 260, target >400, started vitamin B12 1000 mcg IM injection during hospital stay and oral supplement on discharge. Vitamin D deficiency, started vitamin D 50,000 units p.o. weekly.  Body mass index is 33.38 kg/m.  Interventions:       Diet: Pured DVT Prophylaxis: Subcutaneous Lovenox   Advance goals of care discussion: Full code  Family Communication: family was NOT present at bedside, at the time of interview.  The pt provided permission to discuss medical plan with the family. Opportunity was given to ask question and all questions were answered satisfactorily.  Disposition:  Pt is from Home, admitted with swelling of face and severe pain due to shingles, still has swelling and pain, which precludes a safe discharge. Discharge to home, when when clinically improves, may  require 3 to 5 days.  Subjective: No significant overnight events, patient still has severe pain and burning sensation on the left-sided forehead and left side of the face, inflammation of the face is little bit better, patient is able to open left eye a little more as compared to yesterday. Denies any shortness of breath, no chest pain or palpitations.   Physical Exam: General:  alert oriented to time, place, and person.  Appear in moderate distress, affect appropriate Eyes: Right eye EOMI, left eye blurry vision and partially closed due to inflammation ENT: Oral mucosa unable to see lesions patient was not able to open completely Neck: no JVD,  Cardiovascular: S1 and S2 Present, no Murmur,  Respiratory: good respiratory effort, Bilateral Air entry equal and Decreased, no Crackles, no wheezes Abdomen: Bowel Sound present, Soft and no tenderness,  Skin: Left-sided face swelling, some crusted lesions noticed on the left side of the left.  No visible vesicles Extremities: no Pedal edema, no calf tenderness Neurologic: without any new focal findings Gait not checked due to patient safety concerns  Vitals:   06/12/21 1942 06/13/21 0458 06/13/21 0803 06/13/21 1206  BP: (!) 168/96 (!) 157/88 137/84 (!) 145/89  Pulse: 69 75 71 81  Resp: 18 16 18 20   Temp: 98.5 F (36.9 C) 98.2 F (36.8 C) 97.7 F (36.5 C) 98.4 F (36.9 C)  TempSrc: Oral Oral  Oral  SpO2: 98% 100% 100% 93%  Weight:      Height:        Intake/Output Summary (Last 24 hours) at 06/13/2021 1520 Last data filed at 06/13/2021 1423 Gross per 24 hour  Intake 480 ml  Output --  Net 480 ml   Filed Weights   06/11/21 1128  Weight: 91 kg    Data Reviewed: I have personally reviewed and interpreted daily labs, tele strips, imagings as discussed above. I reviewed all nursing notes, pharmacy notes, vitals, pertinent old records I have discussed plan of care as described above with RN and patient/family.  CBC: Recent  Labs  Lab 06/07/21 2350 06/11/21 1245 06/12/21 0657 06/13/21 0519  WBC 5.4 7.0 7.1 6.6  NEUTROABS 1.9 3.5  --   --   HGB 9.2* 10.1* 9.7* 9.3*  HCT 30.5* 32.9* 30.5* 31.1*  MCV 74.2* 72.8* 73.5* 72.8*  PLT 191 211 229 228   Basic Metabolic Panel: Recent Labs  Lab 06/07/21 2350 06/11/21 1322 06/12/21 0657 06/13/21 0519  NA 137 137 134* 136  K 3.9 3.9 3.9 3.8  CL 106 103 104 107  CO2 27 25 24 25   GLUCOSE 107* 98 144* 94  BUN 14 11 13 15   CREATININE 0.91 0.90 0.75 0.88  CALCIUM 9.2 9.7 9.0 8.7*  MG  --   --   --  2.0  PHOS  --   --   --  3.1    Studies: No results found.  Scheduled Meds:  vitamin C  500 mg Oral Daily   cyanocobalamin  1,000 mcg Intramuscular Daily   dexamethasone  2 drop Both Eyes Q4H   enoxaparin (LOVENOX) injection  40 mg Subcutaneous Q24H   erythromycin   Left Eye Q8H   feeding supplement  237 mL Oral TID BM   iron polysaccharides  150 mg Oral Daily  levothyroxine  88 mcg Oral QAC breakfast   methylPREDNISolone (SOLU-MEDROL) injection  40 mg Intravenous Q12H   PARoxetine  20 mg Oral Daily   [START ON 06/19/2021] vitamin B-12  500 mcg Oral Daily   Vitamin D (Ergocalciferol)  50,000 Units Oral Q7 days   Continuous Infusions:  sodium chloride 150 mL/hr at 06/13/21 1259   acyclovir 705 mg (06/13/21 1335)   PRN Meds: albuterol, docusate, fentaNYL (SUBLIMAZE) injection, ketorolac, lidocaine, loratadine, oxyCODONE, phenol, traZODone  Time spent: 35 minutes  Author: Gillis Santa. MD [facility] 06/13/2021 3:20 PM  To reach On-call, see care teams to locate the attending and reach out to them via [email or web address]. If 7PM-7AM, please contact night-coverage If you still have difficulty reaching the attending provider, please page the [facility] (Director on Call) for [facility] on amion for assistance.

## 2021-06-14 DIAGNOSIS — R7401 Elevation of levels of liver transaminase levels: Secondary | ICD-10-CM

## 2021-06-14 DIAGNOSIS — B028 Zoster with other complications: Principal | ICD-10-CM

## 2021-06-14 DIAGNOSIS — D509 Iron deficiency anemia, unspecified: Secondary | ICD-10-CM

## 2021-06-14 LAB — CBC
HCT: 30.5 % — ABNORMAL LOW (ref 36.0–46.0)
Hemoglobin: 9.1 g/dL — ABNORMAL LOW (ref 12.0–15.0)
MCH: 21.7 pg — ABNORMAL LOW (ref 26.0–34.0)
MCHC: 29.8 g/dL — ABNORMAL LOW (ref 30.0–36.0)
MCV: 72.8 fL — ABNORMAL LOW (ref 80.0–100.0)
Platelets: 231 10*3/uL (ref 150–400)
RBC: 4.19 MIL/uL (ref 3.87–5.11)
RDW: 18.4 % — ABNORMAL HIGH (ref 11.5–15.5)
WBC: 6.7 10*3/uL (ref 4.0–10.5)
nRBC: 0 % (ref 0.0–0.2)

## 2021-06-14 LAB — HEPATIC FUNCTION PANEL
ALT: 66 U/L — ABNORMAL HIGH (ref 0–44)
AST: 73 U/L — ABNORMAL HIGH (ref 15–41)
Albumin: 3 g/dL — ABNORMAL LOW (ref 3.5–5.0)
Alkaline Phosphatase: 42 U/L (ref 38–126)
Bilirubin, Direct: <0.1 mg/dL (ref 0.0–0.2)
Total Bilirubin: 0.7 mg/dL (ref 0.3–1.2)
Total Protein: 7.6 g/dL (ref 6.5–8.1)

## 2021-06-14 LAB — BASIC METABOLIC PANEL
Anion gap: 7 (ref 5–15)
BUN: 15 mg/dL (ref 6–20)
CO2: 25 mmol/L (ref 22–32)
Calcium: 8.7 mg/dL — ABNORMAL LOW (ref 8.9–10.3)
Chloride: 106 mmol/L (ref 98–111)
Creatinine, Ser: 0.81 mg/dL (ref 0.44–1.00)
GFR, Estimated: >60 mL/min (ref >60)
Glucose, Bld: 88 mg/dL (ref 70–99)
Potassium: 3.7 mmol/L (ref 3.5–5.1)
Sodium: 138 mmol/L (ref 135–145)

## 2021-06-14 LAB — MAGNESIUM: Magnesium: 1.9 mg/dL (ref 1.7–2.4)

## 2021-06-14 LAB — PHOSPHORUS: Phosphorus: 3.1 mg/dL (ref 2.5–4.6)

## 2021-06-14 NOTE — Progress Notes (Signed)
PROGRESS NOTE    Megan [name]  INO:[id number] DOB: 06-Aug-1971 DOA: 06/11/2021 PCP: [facility]   Brief Narrative:  Megan [name] is a 50 y.o. female with medical history significant of hypothyroid, mild intermittent asthma, obesity, came with worsening of left sided face/scalp swelling pain and rash.    Her symptoms started on last Saturday, initially was numbness tingling sensation on the left side of face scalp and lips, she came to the ED, work-up including CT scan and MRI negative for acute findings.  And patient was reassured and sent home.  Over the last 2 days, patient symptoms started to get worse, she developed blisters  and rash on left-sided face and lips, with extreme sharp pain, and yesterday her lips and throat became swollen.  She went to see her PCP, who diagnosed her with herpes zoster, started her on Valcyclovir and steroid.  She went home, however, she was not able to swallow acyclovir pills or the tramadol, but she was able to swallow much smaller pills.  She also complains about light sensitivity on the left eye and swelling and pain of left eye.  Since yesterday, she also had feeling of her left ear cannel started to tingling, but she denied any hearing changes or ear ringing. No cough, no stridor, no SOB.   ED Course: Patient only tolerate ValTrax, liquid form of acyclovir started in ED. fluorescein dye showed several left cornea lesions and ophthalmology Dr. Brooke Dare was contacted recommend continue acyclovir treatment and ABX eye ointment.   Assessment & Plan:   Active Problems:   Herpes zoster dermatitis with ophthalmic complication   Shingles   Trigeminal herpes zoster, herpes zoster ophthalmicus (V1) and maxillary (V2) -With cornea lesions, ophthalmology was consulted, recommended systemic antiviral treatment.  Currently patient not tolerating large pills, on IV currently, acyclovir 5 times daily x7 days.   As per ophthalmology  recommendation, erythromycin ointment will be used.  Seen by ophthalmology  -None of the facial lesions are crossing midline indicating non-disseminated, and lesions on her lips and face already crusted. No need for contact isolation. -Pain control, IVF to prevent dehydration. -HIV, UDS Continue Toradol IV for pain control and Solu-Medrol 40 every 12 hourly -not ready for d/c     Acute dysphagia -Reported having trouble swallowing solid food and large pills. Likely related to the lips and buccal mucosa lesions. -Liquid diet. -Phenol spray -Speech evaluation done recommended pured diet   Elevated LFTs -Isolated finding -According literature, herpes zoster hepatitis is rare. -Recheck LFT and trend, ordered acute hepatitis panel. -RUQ U/S. Fatty liver with fatty sparing. Negative for gallstones or biliary dilatation   Asthma/Depression/Hypothyroid/Chronic microcytic anemia -No issue. Home meds.   Iron deficiency, iron saturation 6%, started Niferex oral iron supplement.  Follow with PCP to repeat iron profile after 3 to 6 months.  Vitamin B12 level 260, target >400, started vitamin B12 1000 mcg IM injection during hospital stay and oral supplement on discharge.  Vitamin D deficiency, started vitamin D 50,000 units p.o. weekly.   Body mass index is 33.38 kg/m.  Interventions:   DVT prophylaxis: Lovenox SQ  Code Status:     Code Status Orders  (From admission, onward)           Start     Ordered   06/11/21 1458  Full code  Continuous        06/11/21 1458           Code Status History  This patient has a current code status but no historical code status.      Family Communication: none at bedside, discussed with pt in detail;  Disposition Plan:   Not medically ready for d/c. Pt is from Home, admitted with swelling of face and severe pain due to shingles, still has swelling and pain, which precludes a safe discharge. Discharge to home, when when clinically  improves, may require 3 to 5 days Consults called: None Admission status: Inpatient   Consultants:  None  Procedures:  CT HEAD WO CONTRAST ( )  Result Date: 06/13/2021 CLINICAL DATA:  Left-sided weakness EXAM: CT HEAD WITHOUT CONTRAST TECHNIQUE: Contiguous axial images were obtained from the base of the skull through the vertex without intravenous contrast. COMPARISON:  06/08/2021 FINDINGS: Brain: There is no mass, hemorrhage or extra-axial collection. The size and configuration of the ventricles and extra-axial CSF spaces are normal. The brain parenchyma is normal, without acute or chronic infarction. Vascular: No abnormal hyperdensity of the major intracranial arteries or dural venous sinuses. No intracranial atherosclerosis. Skull: The visualized skull base, calvarium and extracranial soft tissues are normal. Sinuses/Orbits: No fluid levels or advanced mucosal thickening of the visualized paranasal sinuses. No mastoid or middle ear effusion. The orbits are normal. IMPRESSION: Normal head CT. Electronically Signed   By: Deatra Robinson M.D.   On: 06/13/2021 19:11   CT HEAD WO CONTRAST  Result Date: 06/08/2021 CLINICAL DATA:  Numbness/Tingling unilateral. Paresthesia involving the lips, face, and scalp EXAM: CT HEAD WITHOUT CONTRAST TECHNIQUE: Contiguous axial images were obtained from the base of the skull through the vertex without intravenous contrast. COMPARISON:  None. FINDINGS: Brain: Normal anatomic configuration. No abnormal intra or extra-axial mass lesion or fluid collection. No abnormal mass effect or midline shift. No evidence of acute intracranial hemorrhage or infarct. Ventricular size is normal. Cerebellum unremarkable. Vascular: Unremarkable Skull: Intact Sinuses/Orbits: There is mucosal thickening within the left sphenoid sinus and bubbly secretions within the posterior right ethmoid air cell. Remaining paranasal sinuses are clear. Orbits are unremarkable. Other: Mastoid air cells  and middle ear cavities are clear. IMPRESSION: No acute intracranial abnormality. Mild paranasal sinus disease. Electronically Signed   By: Helyn Numbers M.D.   On: 06/08/2021 00:57   CT Soft Tissue Neck W Contrast  Result Date: 06/08/2021 CLINICAL DATA:  50 year old female with abnormal numbness and tingling involving the lips, face, and scalp. Adenoid hypertrophy and questionable cervical lymphadenopathy on brain MRI earlier today. EXAM: CT NECK WITH CONTRAST TECHNIQUE: Multidetector CT imaging of the neck was performed using the standard protocol following the bolus administration of intravenous contrast. CONTRAST:  60mL OMNIPAQUE IOHEXOL 350 MG/ML SOLN COMPARISON:  Brain MRI 0336 hours. FINDINGS: Pharynx and larynx: Negative larynx. Hypopharynx within normal limits. Palatine tonsils appear to be present and relatively normal, symmetric. There is generalized adenoid hypertrophy (series 2, image 19). No heterogeneous adenoid enhancement. Negative parapharyngeal spaces. Retropharyngeal space is remarkable for both prominent retropharyngeal lymph nodes (8 mm short axis series 2, image 30) and of partially retropharyngeal course of both carotid arteries. Salivary glands: Negative sublingual space. Negative submandibular glands. Parotid glands appear negative aside from mild prominence of subcentimeter bilateral parotid space lymph nodes. Thyroid: Negative. Lymph nodes: Lymph nodes throughout the visible bilateral neck are increased in number, and many nodes are approaching 10 mm short axis. The largest nodes are 12-13 mm short axis, including at the bilateral level 2 and left level 1A nodal stations. No cystic or necrotic nodes. Vascular: Partially retropharyngeal course of  both carotids. The major vascular structures in the neck and at the skull base are patent. Limited intracranial: Negative. Visualized orbits: Negative. Mastoids and visualized paranasal sinuses: Mild bubbly opacity and mucosal thickening in  the posterior right ethmoid air cell. Similar mucosal thickening in the left sphenoid sinus and right maxillary alveolar recess. Tympanic cavities and visible mastoids are clear. Skeleton: No acute dental finding. Congenital incomplete ossification of the posterior C1 ring. C5-C6 cervical disc and endplate degeneration. No acute or suspicious osseous lesion. Upper chest: Visible bilateral axillary lymph nodes are small but appear increased in number (series 2, image 88 on the right). No superior mediastinal lymphadenopathy is identified. Lung apices are clear. IMPRESSION: 1. Generalized adenoid hypertrophy and bilateral cervical lymph nodes with diffuse mild increase in size and number. Numerous maximal 10 mm nodes, and the largest bilateral largest nodes are 12-13 mm short axis. No cystic or necrotic nodes, and the remaining pharynx appears normal. This appearance is nonspecific, and can be occasionally physiologic in patients with large body habitus, but is suspicious for a Lymphoproliferative disorder. 2. Minor paranasal sinus inflammation, significance doubtful. Electronically Signed   By: Odessa Fleming M.D.   On: 06/08/2021 05:13   MR BRAIN WO CONTRAST  Result Date: 06/08/2021 CLINICAL DATA:  50 year old female with abnormal numbness and tingling involving the lips, face, and scalp. EXAM: MRI HEAD WITHOUT CONTRAST TECHNIQUE: Multiplanar, multiecho pulse sequences of the brain and surrounding structures were obtained without intravenous contrast. COMPARISON:  Head CT 0042 hours today. FINDINGS: Brain: Normal cerebral volume. No restricted diffusion to suggest acute infarction. No midline shift, mass effect, evidence of mass lesion, ventriculomegaly, extra-axial collection or acute intracranial hemorrhage. Cervicomedullary junction and pituitary are within normal limits. Wallace Cullens and white matter signal is within normal limits for age throughout the brain. No chronic cerebral blood products or encephalomalacia is  identified. Vascular: Major intracranial vascular flow voids are preserved. Skull and upper cervical spine: Negative. Sinuses/Orbits: Negative orbits. Mild ethmoid and sphenoid sinus mucosal thickening. No sinus fluid levels. Other: Trace fluid in the left mastoid air cells. There is symmetric adenoid hypertrophy (series 10, image 1). Coronal images suggest prominent cervical lymph nodes. But scalp and face soft tissues appear symmetric and negative. IMPRESSION: 1. Normal for age noncontrast MRI appearance of the brain. 2. Nonspecific adenoid hypertrophy and suggestion of prominent upper cervical lymph nodes. If there is any palpable lymphadenopathy in the neck then consider a lymphoproliferative disorder, and Neck CT with IV contrast might be valuable. Electronically Signed   By: Odessa Fleming M.D.   On: 06/08/2021 04:28   US Abdomen Limited RUQ (LIVER/GB)  Result Date: 06/11/2021 CLINICAL DATA:  Elevated LFT EXAM: ULTRASOUND ABDOMEN LIMITED RIGHT UPPER QUADRANT COMPARISON:  None. FINDINGS: Gallbladder: No gallstones or wall thickening visualized. No sonographic Murphy sign noted by sonographer. Common bile duct: Diameter: 5.0 mm Liver: Fatty liver with fatty sparing adjacent to the falciform ligament. No mass lesion. Portal vein is patent on color Doppler imaging with normal direction of blood flow towards the liver. Other: None. IMPRESSION: Fatty liver with fatty sparing. Negative for gallstones or biliary dilatation. Electronically Signed   By: Marlan Palau M.D.   On: 06/11/2021 16:47    Antimicrobials:  acyclovir    Subjective: Reports still with oral pain and eye pain ,but improving  Objective: Vitals:   06/13/21 2213 06/14/21 0621 06/14/21 0736 06/14/21 1118  BP: (!) 145/89 (!) 141/73 (!) 146/88 (!) 136/97  Pulse: 70 94 65 77  Resp: 16  18 16 18   Temp: 98.3 F (36.8 C) 98.5 F (36.9 C) 98.8 F (37.1 C) 98.4 F (36.9 C)  TempSrc: Oral Oral  Oral  SpO2: 98% 98% 97% 99%  Weight:       Height:        Intake/Output Summary (Last 24 hours) at 06/14/2021 1159 Last data filed at 06/14/2021 0733 Gross per 24 hour  Intake 240 ml  Output 2800 ml  Net -2560 ml   Filed Weights   06/11/21 1128  Weight: 91 kg    Examination:  General exam: Appears calm and comfortable  Respiratory system: Clear to auscultation. Respiratory effort normal. Cardiovascular system: S1 & S2 heard, RRR. No JVD, murmurs, rubs, gallops or clicks. No pedal edema. Gastrointestinal system: Abdomen is nondistended, soft and nontender. No organomegaly or masses felt. Normal bowel sounds heard. Central nervous system: Alert and oriented. No focal neurological deficits. Extremities: wwp, no edema Skin: Skin: Left-sided face swelling, some crusted lesions noticed on the left side of the left.  No visible vesiclesNo rashes, lesions or ulcers Psychiatry: Judgement and insight appear normal. Mood & affect appropriate.     Data Reviewed: I have personally reviewed following labs and imaging studies  CBC: Recent Labs  Lab 06/07/21 2350 06/11/21 1245 06/12/21 0657 06/13/21 0519 06/14/21 0450  WBC 5.4 7.0 7.1 6.6 6.7  NEUTROABS 1.9 3.5  --   --   --   HGB 9.2* 10.1* 9.7* 9.3* 9.1*  HCT 30.5* 32.9* 30.5* 31.1* 30.5*  MCV 74.2* 72.8* 73.5* 72.8* 72.8*  PLT 191 211 229 228 231   Basic Metabolic Panel: Recent Labs  Lab 06/07/21 2350 06/11/21 1322 06/12/21 0657 06/13/21 0519 06/14/21 0450  NA 137 137 134* 136 138  K 3.9 3.9 3.9 3.8 3.7  CL 106 103 104 107 106  CO2 27 25 24 25 25   GLUCOSE 107* 98 144* 94 88  BUN 14 11 13 15 15   CREATININE 0.91 0.90 0.75 0.88 0.81  CALCIUM 9.2 9.7 9.0 8.7* 8.7*  MG  --   --   --  2.0 1.9  PHOS  --   --   --  3.1 3.1   GFR: Estimated Creatinine Clearance: 94.7 mL/min (by C-G formula based on SCr of 0.81 mg/dL). Liver Function Tests: Recent Labs  Lab 06/07/21 2350 06/11/21 1322 06/12/21 0657 06/14/21 0450  AST 108* 80* 82* 73*  ALT 77* 65* 70* 66*   ALKPHOS 59 45 50 42  BILITOT 0.8 1.0 1.1 0.7  PROT 8.5* 8.0 8.4* 7.6  ALBUMIN 3.7 3.5 3.6 3.0*   No results for input(s): LIPASE, AMYLASE in the last 168 hours. No results for input(s): AMMONIA in the last 168 hours. Coagulation Profile: Recent Labs  Lab 06/07/21 2350  INR 1.0   Cardiac Enzymes: Recent Labs  Lab 06/11/21 1322  CKTOTAL 33*   BNP (last 3 results) No results for input(s): PROBNP in the last 8760 hours. HbA1C: No results for input(s): HGBA1C in the last 72 hours. CBG: Recent Labs  Lab 06/08/21 0134  GLUCAP 102*   Lipid Profile: No results for input(s): CHOL, HDL, LDLCALC, TRIG, CHOLHDL, LDLDIRECT in the last 72 hours. Thyroid Function Tests: Recent Labs    06/12/21 0657  TSH 0.665   Anemia Panel: Recent Labs    06/12/21 0657  VITAMINB12 260  FOLATE 11.2  TIBC 500*  IRON 30   Sepsis Labs: No results for input(s): PROCALCITON, LATICACIDVEN in the last 168 hours.  Recent Results (from  the past 240 hour(s))  Resp Panel by RT-PCR (Flu A&B, Covid) Nasopharyngeal Swab     Status: None   Collection Time: 06/11/21 12:45 PM   Specimen: Nasopharyngeal Swab; Nasopharyngeal(NP) swabs in vial transport medium  Result Value Ref Range Status   SARS Coronavirus 2 by RT PCR NEGATIVE NEGATIVE Final    Comment: (NOTE) SARS-CoV-2 target nucleic acids are NOT DETECTED.  The SARS-CoV-2 RNA is generally detectable in upper respiratory specimens during the acute phase of infection. The lowest concentration of SARS-CoV-2 viral copies this assay can detect is 138 copies/mL. A negative result does not preclude SARS-Cov-2 infection and should not be used as the sole basis for treatment or other patient management decisions. A negative result may occur with  improper specimen collection/handling, submission of specimen other than nasopharyngeal swab, presence of viral mutation(s) within the areas targeted by this assay, and inadequate number of viral copies(<138  copies/mL). A negative result must be combined with clinical observations, patient history, and epidemiological information. The expected result is Negative.  Fact Sheet for Patients:  BloggerCourse.com  Fact Sheet for Healthcare Providers:  SeriousBroker.it  This test is no t yet approved or cleared by the [place] FDA and  has been authorized for detection and/or diagnosis of SARS-CoV-2 by FDA under an Emergency Use Authorization (EUA). This EUA will remain  in effect (meaning this test can be used) for the duration of the COVID-19 declaration under Section 564(b)(1) of the Act, 21 U.S.C.section 360bbb-3(b)(1), unless the authorization is terminated  or revoked sooner.       Influenza A by PCR NEGATIVE NEGATIVE Final   Influenza B by PCR NEGATIVE NEGATIVE Final    Comment: (NOTE) The Xpert Xpress SARS-CoV-2/FLU/RSV plus assay is intended as an aid in the diagnosis of influenza from Nasopharyngeal swab specimens and should not be used as a sole basis for treatment. Nasal washings and aspirates are unacceptable for Xpert Xpress SARS-CoV-2/FLU/RSV testing.  Fact Sheet for Patients: BloggerCourse.com  Fact Sheet for Healthcare Providers: SeriousBroker.it  This test is not yet approved or cleared by the [place] FDA and has been authorized for detection and/or diagnosis of SARS-CoV-2 by FDA under an Emergency Use Authorization (EUA). This EUA will remain in effect (meaning this test can be used) for the duration of the COVID-19 declaration under Section 564(b)(1) of the Act, 21 U.S.C. section 360bbb-3(b)(1), unless the authorization is terminated or revoked.  Performed at [facility], [address] [place], [place] [address]          Radiology Studies: CT HEAD WO CONTRAST ( )  Result Date: 06/13/2021 CLINICAL DATA:  Left-sided weakness EXAM:  CT HEAD WITHOUT CONTRAST TECHNIQUE: Contiguous axial images were obtained from the base of the skull through the vertex without intravenous contrast. COMPARISON:  06/08/2021 FINDINGS: Brain: There is no mass, hemorrhage or extra-axial collection. The size and configuration of the ventricles and extra-axial CSF spaces are normal. The brain parenchyma is normal, without acute or chronic infarction. Vascular: No abnormal hyperdensity of the major intracranial arteries or dural venous sinuses. No intracranial atherosclerosis. Skull: The visualized skull base, calvarium and extracranial soft tissues are normal. Sinuses/Orbits: No fluid levels or advanced mucosal thickening of the visualized paranasal sinuses. No mastoid or middle ear effusion. The orbits are normal. IMPRESSION: Normal head CT. Electronically Signed   By: Deatra Robinson M.D.   On: 06/13/2021 19:11        Scheduled Meds:  vitamin C  500 mg Oral Daily  cyanocobalamin  1,000 mcg Intramuscular Daily   dexamethasone  2 drop Both Eyes Q4H   enoxaparin (LOVENOX) injection  40 mg Subcutaneous Q24H   erythromycin   Left Eye Q8H   feeding supplement  237 mL Oral TID BM   iron polysaccharides  150 mg Oral Daily   levothyroxine  88 mcg Oral QAC breakfast   methylPREDNISolone (SOLU-MEDROL) injection  40 mg Intravenous Q12H   PARoxetine  20 mg Oral Daily   [START ON 06/19/2021] vitamin B-12  500 mcg Oral Daily   Vitamin D (Ergocalciferol)  50,000 Units Oral Q7 days   Continuous Infusions:  sodium chloride 150 mL/hr at 06/14/21 0832   acyclovir 705 mg (06/14/21 0531)     LOS: 3 days    Time spent: 38    Burke Keels, MD [facility]  If 7PM-7AM, please contact night-coverage  06/14/2021, 11:59 AM

## 2021-06-15 DIAGNOSIS — B20 Human immunodeficiency virus [HIV] disease: Secondary | ICD-10-CM

## 2021-06-15 LAB — HEPATIC FUNCTION PANEL
ALT: 92 U/L — ABNORMAL HIGH (ref 0–44)
AST: 95 U/L — ABNORMAL HIGH (ref 15–41)
Albumin: 3.2 g/dL — ABNORMAL LOW (ref 3.5–5.0)
Alkaline Phosphatase: 41 U/L (ref 38–126)
Bilirubin, Direct: 0.1 mg/dL (ref 0.0–0.2)
Indirect Bilirubin: 1 mg/dL — ABNORMAL HIGH (ref 0.3–0.9)
Total Bilirubin: 1.1 mg/dL (ref 0.3–1.2)
Total Protein: 8.1 g/dL (ref 6.5–8.1)

## 2021-06-15 LAB — BASIC METABOLIC PANEL WITH GFR
Anion gap: 5 (ref 5–15)
BUN: 12 mg/dL (ref 6–20)
CO2: 24 mmol/L (ref 22–32)
Calcium: 8.7 mg/dL — ABNORMAL LOW (ref 8.9–10.3)
Chloride: 107 mmol/L (ref 98–111)
Creatinine, Ser: 0.77 mg/dL (ref 0.44–1.00)
GFR, Estimated: >60 mL/min
Glucose, Bld: 91 mg/dL (ref 70–99)
Potassium: 3.6 mmol/L (ref 3.5–5.1)
Sodium: 136 mmol/L (ref 135–145)

## 2021-06-15 LAB — CBC
HCT: 29.2 % — ABNORMAL LOW (ref 36.0–46.0)
Hemoglobin: 9.2 g/dL — ABNORMAL LOW (ref 12.0–15.0)
MCH: 22.9 pg — ABNORMAL LOW (ref 26.0–34.0)
MCHC: 31.5 g/dL (ref 30.0–36.0)
MCV: 72.8 fL — ABNORMAL LOW (ref 80.0–100.0)
Platelets: 242 K/uL (ref 150–400)
RBC: 4.01 MIL/uL (ref 3.87–5.11)
RDW: 18.4 % — ABNORMAL HIGH (ref 11.5–15.5)
WBC: 7.4 K/uL (ref 4.0–10.5)
nRBC: 0 % (ref 0.0–0.2)

## 2021-06-15 LAB — PHOSPHORUS: Phosphorus: 3 mg/dL (ref 2.5–4.6)

## 2021-06-15 LAB — MAGNESIUM: Magnesium: 1.9 mg/dL (ref 1.7–2.4)

## 2021-06-15 MED ORDER — SUMATRIPTAN SUCCINATE 50 MG PO TABS
25.0000 mg | ORAL_TABLET | ORAL | Status: AC | PRN
  Administered 2021-06-15: 25 mg via ORAL
  Filled 2021-06-15: qty 1

## 2021-06-15 MED ORDER — SUMATRIPTAN SUCCINATE 50 MG PO TABS: 25.0000 mg | ORAL_TABLET | ORAL | Status: DC | PRN

## 2021-06-15 NOTE — Progress Notes (Addendum)
PROGRESS NOTE    Megan [name]  ZOX:[id number] DOB: [birth year] DOA: 06/11/2021 PCP: [facility]   Brief Narrative:  Megan [name] is a 50 y.o. female with medical history significant of hypothyroid, mild intermittent asthma, obesity, came with worsening of left sided face/scalp swelling pain and rash.    Her symptoms started on last Saturday, initially was numbness tingling sensation on the left side of face scalp and lips, she came to the ED, work-up including CT scan and MRI negative for acute findings.  And patient was reassured and sent home.  Over the last 2 days, patient symptoms started to get worse, she developed blisters  and rash on left-sided face and lips, with extreme sharp pain, and yesterday her lips and throat became swollen.  She went to see her PCP, who diagnosed her with herpes zoster, started her on Valcyclovir and steroid.  She went home, however, she was not able to swallow acyclovir pills or the tramadol, but she was able to swallow much smaller pills.  She also complains about light sensitivity on the left eye and swelling and pain of left eye.  Since yesterday, she also had feeling of her left ear cannel started to tingling, but she denied any hearing changes or ear ringing. No cough, no stridor, no SOB.   ED Course: Patient only tolerate ValTrax, liquid form of acyclovir started in ED. fluorescein dye showed several left cornea lesions and ophthalmology Dr. Brooke Dare was contacted recommend continue acyclovir treatment and ABX eye ointment.   Assessment & Plan:   Active Problems:   Herpes zoster dermatitis with ophthalmic complication   Shingles  ADDENDUM 14:10 ON 10/16: Pt with posiitve HIV test 06/12/2021 at 0657 and was not informed of test. I discussed results with her.  I contacted the lab and they confirmed the test has been sent out for confirmatory testing and the results are not back yet.  Will order CD4. Recommend ID consult Monday  pending results of CD4 and confirmatory testing.  Trigeminal herpes zoster, herpes zoster ophthalmicus (V1) and maxillary (V2) -With cornea lesions, ophthalmology was consulted, recommended systemic antiviral treatment.  Currently patient not tolerating large pills, on IV currently, acyclovir 5 times daily x7 days.  Today is day 5/7 As per ophthalmology recommendation, erythromycin ointment will be used.  Seen by ophthalmology  -None of the facial lesions are crossing midline indicating non-disseminated, and lesions on her lips and face already crusted. No need for contact isolation. -Pain control, IVF to prevent dehydration. -HIV, UDS Continue Toradol IV for pain control and Solu-Medrol 40 every 12 hourly -not ready for d/c     Acute dysphagia -Reported having trouble swallowing solid food and large pills. Likely related to the lips and buccal mucosa lesions. -slowly improving -advancing diet today -Phenol spray -Speech evaluation done recommended pured diet   Elevated LFTs -Isolated finding -According literature, herpes zoster hepatitis is rare. -Recheck LFT and trending down, ordered acute hepatitis panel which was negative -RUQ U/S. Fatty liver with fatty sparing. Negative for gallstones or biliary dilatation   Asthma/Depression/Hypothyroid/Chronic microcytic anemia -No issue. Home meds.   Iron deficiency, iron saturation 6%, started Niferex oral iron supplement.  Follow with PCP to repeat iron profile after 3 to 6 months.   Vitamin B12 level 260, target >400, started vitamin B12 1000 mcg IM injection during hospital stay and oral supplement on discharge.   Vitamin D deficiency, started vitamin D 50,000 units p.o. weekly.   Body mass index  is 33.38 kg/m.  Interventions  DVT prophylaxis: Lovenox SQ  Code Status: full code    Code Status Orders  (From admission, onward)           Start     Ordered   06/11/21 1458  Full code  Continuous        06/11/21 1458            Code Status History     This patient has a current code status but no historical code status.        Family Communication: none at bedside, discussed with pt in detail;  Disposition Plan:   Not medically ready for d/c. Pt is from Home, admitted with swelling of face and severe pain due to shingles, still has swelling and pain, which precludes a safe discharge. Discharge to home, when when clinically improves, may require 3 to 5 days Consults called: None Admission status: Inpatient   Consultants:  None  Procedures:  CT HEAD WO CONTRAST ( )  Result Date: 06/13/2021 CLINICAL DATA:  Left-sided weakness EXAM: CT HEAD WITHOUT CONTRAST TECHNIQUE: Contiguous axial images were obtained from the base of the skull through the vertex without intravenous contrast. COMPARISON:  06/08/2021 FINDINGS: Brain: There is no mass, hemorrhage or extra-axial collection. The size and configuration of the ventricles and extra-axial CSF spaces are normal. The brain parenchyma is normal, without acute or chronic infarction. Vascular: No abnormal hyperdensity of the major intracranial arteries or dural venous sinuses. No intracranial atherosclerosis. Skull: The visualized skull base, calvarium and extracranial soft tissues are normal. Sinuses/Orbits: No fluid levels or advanced mucosal thickening of the visualized paranasal sinuses. No mastoid or middle ear effusion. The orbits are normal. IMPRESSION: Normal head CT. Electronically Signed   By: Deatra Robinson M.D.   On: 06/13/2021 19:11   CT HEAD WO CONTRAST  Result Date: 06/08/2021 CLINICAL DATA:  Numbness/Tingling unilateral. Paresthesia involving the lips, face, and scalp EXAM: CT HEAD WITHOUT CONTRAST TECHNIQUE: Contiguous axial images were obtained from the base of the skull through the vertex without intravenous contrast. COMPARISON:  None. FINDINGS: Brain: Normal anatomic configuration. No abnormal intra or extra-axial mass lesion or fluid  collection. No abnormal mass effect or midline shift. No evidence of acute intracranial hemorrhage or infarct. Ventricular size is normal. Cerebellum unremarkable. Vascular: Unremarkable Skull: Intact Sinuses/Orbits: There is mucosal thickening within the left sphenoid sinus and bubbly secretions within the posterior right ethmoid air cell. Remaining paranasal sinuses are clear. Orbits are unremarkable. Other: Mastoid air cells and middle ear cavities are clear. IMPRESSION: No acute intracranial abnormality. Mild paranasal sinus disease. Electronically Signed   By: Helyn Numbers M.D.   On: 06/08/2021 00:57   CT Soft Tissue Neck W Contrast  Result Date: 06/08/2021 CLINICAL DATA:  50 year old female with abnormal numbness and tingling involving the lips, face, and scalp. Adenoid hypertrophy and questionable cervical lymphadenopathy on brain MRI earlier today. EXAM: CT NECK WITH CONTRAST TECHNIQUE: Multidetector CT imaging of the neck was performed using the standard protocol following the bolus administration of intravenous contrast. CONTRAST:  74mL OMNIPAQUE IOHEXOL 350 MG/ML SOLN COMPARISON:  Brain MRI 0336 hours. FINDINGS: Pharynx and larynx: Negative larynx. Hypopharynx within normal limits. Palatine tonsils appear to be present and relatively normal, symmetric. There is generalized adenoid hypertrophy (series 2, image 19). No heterogeneous adenoid enhancement. Negative parapharyngeal spaces. Retropharyngeal space is remarkable for both prominent retropharyngeal lymph nodes (8 mm short axis series 2, image 30) and of partially retropharyngeal course of both carotid  arteries. Salivary glands: Negative sublingual space. Negative submandibular glands. Parotid glands appear negative aside from mild prominence of subcentimeter bilateral parotid space lymph nodes. Thyroid: Negative. Lymph nodes: Lymph nodes throughout the visible bilateral neck are increased in number, and many nodes are approaching 10 mm short  axis. The largest nodes are 12-13 mm short axis, including at the bilateral level 2 and left level 1A nodal stations. No cystic or necrotic nodes. Vascular: Partially retropharyngeal course of both carotids. The major vascular structures in the neck and at the skull base are patent. Limited intracranial: Negative. Visualized orbits: Negative. Mastoids and visualized paranasal sinuses: Mild bubbly opacity and mucosal thickening in the posterior right ethmoid air cell. Similar mucosal thickening in the left sphenoid sinus and right maxillary alveolar recess. Tympanic cavities and visible mastoids are clear. Skeleton: No acute dental finding. Congenital incomplete ossification of the posterior C1 ring. C5-C6 cervical disc and endplate degeneration. No acute or suspicious osseous lesion. Upper chest: Visible bilateral axillary lymph nodes are small but appear increased in number (series 2, image 88 on the right). No superior mediastinal lymphadenopathy is identified. Lung apices are clear. IMPRESSION: 1. Generalized adenoid hypertrophy and bilateral cervical lymph nodes with diffuse mild increase in size and number. Numerous maximal 10 mm nodes, and the largest bilateral largest nodes are 12-13 mm short axis. No cystic or necrotic nodes, and the remaining pharynx appears normal. This appearance is nonspecific, and can be occasionally physiologic in patients with large body habitus, but is suspicious for a Lymphoproliferative disorder. 2. Minor paranasal sinus inflammation, significance doubtful. Electronically Signed   By: Odessa Fleming M.D.   On: 06/08/2021 05:13   MR BRAIN WO CONTRAST  Result Date: 06/08/2021 CLINICAL DATA:  50 year old female with abnormal numbness and tingling involving the lips, face, and scalp. EXAM: MRI HEAD WITHOUT CONTRAST TECHNIQUE: Multiplanar, multiecho pulse sequences of the brain and surrounding structures were obtained without intravenous contrast. COMPARISON:  Head CT 0042 hours today.  FINDINGS: Brain: Normal cerebral volume. No restricted diffusion to suggest acute infarction. No midline shift, mass effect, evidence of mass lesion, ventriculomegaly, extra-axial collection or acute intracranial hemorrhage. Cervicomedullary junction and pituitary are within normal limits. Wallace Cullens and white matter signal is within normal limits for age throughout the brain. No chronic cerebral blood products or encephalomalacia is identified. Vascular: Major intracranial vascular flow voids are preserved. Skull and upper cervical spine: Negative. Sinuses/Orbits: Negative orbits. Mild ethmoid and sphenoid sinus mucosal thickening. No sinus fluid levels. Other: Trace fluid in the left mastoid air cells. There is symmetric adenoid hypertrophy (series 10, image 1). Coronal images suggest prominent cervical lymph nodes. But scalp and face soft tissues appear symmetric and negative. IMPRESSION: 1. Normal for age noncontrast MRI appearance of the brain. 2. Nonspecific adenoid hypertrophy and suggestion of prominent upper cervical lymph nodes. If there is any palpable lymphadenopathy in the neck then consider a lymphoproliferative disorder, and Neck CT with IV contrast might be valuable. Electronically Signed   By: Odessa Fleming M.D.   On: 06/08/2021 04:28   US Abdomen Limited RUQ (LIVER/GB)  Result Date: 06/11/2021 CLINICAL DATA:  Elevated LFT EXAM: ULTRASOUND ABDOMEN LIMITED RIGHT UPPER QUADRANT COMPARISON:  None. FINDINGS: Gallbladder: No gallstones or wall thickening visualized. No sonographic Murphy sign noted by sonographer. Common bile duct: Diameter: 5.0 mm Liver: Fatty liver with fatty sparing adjacent to the falciform ligament. No mass lesion. Portal vein is patent on color Doppler imaging with normal direction of blood flow towards the liver. Other: None.  IMPRESSION: Fatty liver with fatty sparing. Negative for gallstones or biliary dilatation. Electronically Signed   By: Marlan Palau M.D.   On: 06/11/2021 16:47     Antimicrobials:  Acyclovir   Subjective: Patient reports mild improvement from yesterday  Objective: Vitals:   06/14/21 2025 06/15/21 0529 06/15/21 0823 06/15/21 1203  BP: (!) 128/92 (!) 146/88 137/87 122/80  Pulse: 78 69 84 80  Resp: 18 16 18 18   Temp: 98.3 F (36.8 C) 98.2 F (36.8 C) 97.9 F (36.6 C) 98.3 F (36.8 C)  TempSrc:   Oral Oral  SpO2: 99% 99% 98% 98%  Weight:      Height:        Intake/Output Summary (Last 24 hours) at 06/15/2021 1323 Last data filed at 06/15/2021 0529 Gross per 24 hour  Intake --  Output 1600 ml  Net -1600 ml   Filed Weights   06/11/21 1128  Weight: 91 kg    Examination:  General exam: Appears calm and comfortable  Respiratory system: Clear to auscultation. Respiratory effort normal. Cardiovascular system: S1 & S2 heard, RRR. No JVD, murmurs, rubs, gallops or clicks. No pedal edema. Gastrointestinal system: Abdomen is nondistended, soft and nontender. No organomegaly or masses felt. Normal bowel sounds heard. Central nervous system: Alert and oriented. No focal neurological deficits. Extremities: Symmetric 5 x 5 power. Skin: Dried lesions on lips no new draining lesions noted  psychiatry: Judgement and insight appear normal. Mood & affect appropriate.     Data Reviewed: I have personally reviewed following labs and imaging studies  CBC: Recent Labs  Lab 06/11/21 1245 06/12/21 0657 06/13/21 0519 06/14/21 0450 06/15/21 0449  WBC 7.0 7.1 6.6 6.7 7.4  NEUTROABS 3.5  --   --   --   --   HGB 10.1* 9.7* 9.3* 9.1* 9.2*  HCT 32.9* 30.5* 31.1* 30.5* 29.2*  MCV 72.8* 73.5* 72.8* 72.8* 72.8*  PLT 211 229 228 231 242   Basic Metabolic Panel: Recent Labs  Lab 06/11/21 1322 06/12/21 0657 06/13/21 0519 06/14/21 0450 06/15/21 0449  NA 137 134* 136 138 136  K 3.9 3.9 3.8 3.7 3.6  CL 103 104 107 106 107  CO2 25 24 25 25 24   GLUCOSE 98 144* 94 88 91  BUN 11 13 15 15 12   CREATININE 0.90 0.75 0.88 0.81 0.77  CALCIUM 9.7  9.0 8.7* 8.7* 8.7*  MG  --   --  2.0 1.9 1.9  PHOS  --   --  3.1 3.1 3.0   GFR: Estimated Creatinine Clearance: 95.8 mL/min (by C-G formula based on SCr of 0.77 mg/dL). Liver Function Tests: Recent Labs  Lab 06/11/21 1322 06/12/21 0657 06/14/21 0450 06/15/21 0449  AST 80* 82* 73* 95*  ALT 65* 70* 66* 92*  ALKPHOS 45 50 42 41  BILITOT 1.0 1.1 0.7 1.1  PROT 8.0 8.4* 7.6 8.1  ALBUMIN 3.5 3.6 3.0* 3.2*   No results for input(s): LIPASE, AMYLASE in the last 168 hours. No results for input(s): AMMONIA in the last 168 hours. Coagulation Profile: No results for input(s): INR, PROTIME in the last 168 hours. Cardiac Enzymes: Recent Labs  Lab 06/11/21 1322  CKTOTAL 33*   BNP (last 3 results) No results for input(s): PROBNP in the last 8760 hours. HbA1C: No results for input(s): HGBA1C in the last 72 hours. CBG: No results for input(s): GLUCAP in the last 168 hours. Lipid Profile: No results for input(s): CHOL, HDL, LDLCALC, TRIG, CHOLHDL, LDLDIRECT in the last 72  hours. Thyroid Function Tests: No results for input(s): TSH, T4TOTAL, FREET4, T3FREE, THYROIDAB in the last 72 hours. Anemia Panel: No results for input(s): VITAMINB12, FOLATE, FERRITIN, TIBC, IRON, RETICCTPCT in the last 72 hours. Sepsis Labs: No results for input(s): PROCALCITON, LATICACIDVEN in the last 168 hours.  Recent Results (from the past 240 hour(s))  Resp Panel by RT-PCR (Flu A&B, Covid) Nasopharyngeal Swab     Status: None   Collection Time: 06/11/21 12:45 PM   Specimen: Nasopharyngeal Swab; Nasopharyngeal(NP) swabs in vial transport medium  Result Value Ref Range Status   SARS Coronavirus 2 by RT PCR NEGATIVE NEGATIVE Final    Comment: (NOTE) SARS-CoV-2 target nucleic acids are NOT DETECTED.  The SARS-CoV-2 RNA is generally detectable in upper respiratory specimens during the acute phase of infection. The lowest concentration of SARS-CoV-2 viral copies this assay can detect is 138 copies/mL. A  negative result does not preclude SARS-Cov-2 infection and should not be used as the sole basis for treatment or other patient management decisions. A negative result may occur with  improper specimen collection/handling, submission of specimen other than nasopharyngeal swab, presence of viral mutation(s) within the areas targeted by this assay, and inadequate number of viral copies(<138 copies/mL). A negative result must be combined with clinical observations, patient history, and epidemiological information. The expected result is Negative.  Fact Sheet for Patients:  BloggerCourse.com  Fact Sheet for Healthcare Providers:  SeriousBroker.it  This test is no t yet approved or cleared by the [place] FDA and  has been authorized for detection and/or diagnosis of SARS-CoV-2 by FDA under an Emergency Use Authorization (EUA). This EUA will remain  in effect (meaning this test can be used) for the duration of the COVID-19 declaration under Section 564(b)(1) of the Act, 21 U.S.C.section 360bbb-3(b)(1), unless the authorization is terminated  or revoked sooner.       Influenza A by PCR NEGATIVE NEGATIVE Final   Influenza B by PCR NEGATIVE NEGATIVE Final    Comment: (NOTE) The Xpert Xpress SARS-CoV-2/FLU/RSV plus assay is intended as an aid in the diagnosis of influenza from Nasopharyngeal swab specimens and should not be used as a sole basis for treatment. Nasal washings and aspirates are unacceptable for Xpert Xpress SARS-CoV-2/FLU/RSV testing.  Fact Sheet for Patients: BloggerCourse.com  Fact Sheet for Healthcare Providers: SeriousBroker.it  This test is not yet approved or cleared by the [place] FDA and has been authorized for detection and/or diagnosis of SARS-CoV-2 by FDA under an Emergency Use Authorization (EUA). This EUA will remain in effect (meaning this test can  be used) for the duration of the COVID-19 declaration under Section 564(b)(1) of the Act, 21 U.S.C. section 360bbb-3(b)(1), unless the authorization is terminated or revoked.  Performed at [facility], [address] [place], [place] [address]          Radiology Studies: CT HEAD WO CONTRAST ( )  Result Date: 06/13/2021 CLINICAL DATA:  Left-sided weakness EXAM: CT HEAD WITHOUT CONTRAST TECHNIQUE: Contiguous axial images were obtained from the base of the skull through the vertex without intravenous contrast. COMPARISON:  06/08/2021 FINDINGS: Brain: There is no mass, hemorrhage or extra-axial collection. The size and configuration of the ventricles and extra-axial CSF spaces are normal. The brain parenchyma is normal, without acute or chronic infarction. Vascular: No abnormal hyperdensity of the major intracranial arteries or dural venous sinuses. No intracranial atherosclerosis. Skull: The visualized skull base, calvarium and extracranial soft tissues are normal. Sinuses/Orbits: No fluid levels or advanced mucosal thickening  of the visualized paranasal sinuses. No mastoid or middle ear effusion. The orbits are normal. IMPRESSION: Normal head CT. Electronically Signed   By: Deatra Robinson M.D.   On: 06/13/2021 19:11        Scheduled Meds:  vitamin C  500 mg Oral Daily   cyanocobalamin  1,000 mcg Intramuscular Daily   dexamethasone  2 drop Both Eyes Q4H   enoxaparin (LOVENOX) injection  40 mg Subcutaneous Q24H   erythromycin   Left Eye Q8H   feeding supplement  237 mL Oral TID BM   iron polysaccharides  150 mg Oral Daily   levothyroxine  88 mcg Oral QAC breakfast   methylPREDNISolone (SOLU-MEDROL) injection  40 mg Intravenous Q12H   PARoxetine  20 mg Oral Daily   [START ON 06/19/2021] vitamin B-12  500 mcg Oral Daily   Vitamin D (Ergocalciferol)  50,000 Units Oral Q7 days   Continuous Infusions:  sodium chloride 50 mL/hr at 06/15/21 0900   acyclovir 705 mg (06/14/21  2141)     LOS: 4 days    Time spent: 35 min    Burke Keels, MD [facility]  If 7PM-7AM, please contact night-coverage  06/15/2021, 1:23 PM

## 2021-06-16 DIAGNOSIS — R1311 Dysphagia, oral phase: Secondary | ICD-10-CM

## 2021-06-16 LAB — COMPREHENSIVE METABOLIC PANEL
ALT: 81 U/L — ABNORMAL HIGH (ref 0–44)
AST: 67 U/L — ABNORMAL HIGH (ref 15–41)
Albumin: 3.3 g/dL — ABNORMAL LOW (ref 3.5–5.0)
Alkaline Phosphatase: 42 U/L (ref 38–126)
Anion gap: 6 (ref 5–15)
BUN: 17 mg/dL (ref 6–20)
CO2: 24 mmol/L (ref 22–32)
Calcium: 9 mg/dL (ref 8.9–10.3)
Chloride: 106 mmol/L (ref 98–111)
Creatinine, Ser: 0.95 mg/dL (ref 0.44–1.00)
GFR, Estimated: >60 mL/min (ref >60)
Glucose, Bld: 99 mg/dL (ref 70–99)
Potassium: 3.6 mmol/L (ref 3.5–5.1)
Sodium: 136 mmol/L (ref 135–145)
Total Bilirubin: 0.8 mg/dL (ref 0.3–1.2)
Total Protein: 7.5 g/dL (ref 6.5–8.1)

## 2021-06-16 LAB — CBC WITH DIFFERENTIAL/PLATELET
Abs Immature Granulocytes: 0.05 10*3/uL (ref 0.00–0.07)
Basophils Absolute: 0 10*3/uL (ref 0.0–0.1)
Basophils Relative: 0 %
Eosinophils Absolute: 0 10*3/uL (ref 0.0–0.5)
Eosinophils Relative: 0 %
HCT: 30.7 % — ABNORMAL LOW (ref 36.0–46.0)
Hemoglobin: 9.6 g/dL — ABNORMAL LOW (ref 12.0–15.0)
Immature Granulocytes: 1 %
Lymphocytes Relative: 40 %
Lymphs Abs: 3.3 10*3/uL (ref 0.7–4.0)
MCH: 22.6 pg — ABNORMAL LOW (ref 26.0–34.0)
MCHC: 31.3 g/dL (ref 30.0–36.0)
MCV: 72.2 fL — ABNORMAL LOW (ref 80.0–100.0)
Monocytes Absolute: 0.8 10*3/uL (ref 0.1–1.0)
Monocytes Relative: 10 %
Neutro Abs: 4.1 10*3/uL (ref 1.7–7.7)
Neutrophils Relative %: 49 %
Platelets: 271 10*3/uL (ref 150–400)
RBC: 4.25 MIL/uL (ref 3.87–5.11)
RDW: 18.6 % — ABNORMAL HIGH (ref 11.5–15.5)
WBC: 8.2 10*3/uL (ref 4.0–10.5)
nRBC: 0 % (ref 0.0–0.2)

## 2021-06-16 LAB — HELPER T-LYMPH-CD4 (ARMC ONLY)
% CD 4 Pos. Lymph.: 17 % — ABNORMAL LOW (ref 30.8–58.5)
Absolute CD 4 Helper: 442 /uL (ref 359–1519)
Basophils Absolute: 0 10*3/uL (ref 0.0–0.2)
Basos: 0 %
EOS (ABSOLUTE): 0.2 10*3/uL (ref 0.0–0.4)
Eos: 2 %
Hematocrit: 32.3 % — ABNORMAL LOW (ref 34.0–46.6)
Hemoglobin: 9.6 g/dL — ABNORMAL LOW (ref 11.1–15.9)
Immature Grans (Abs): 0.1 10*3/uL (ref 0.0–0.1)
Immature Granulocytes: 1 %
Lymphocytes Absolute: 2.6 10*3/uL (ref 0.7–3.1)
Lymphs: 34 %
MCH: 21.9 pg — ABNORMAL LOW (ref 26.6–33.0)
MCHC: 29.7 g/dL — ABNORMAL LOW (ref 31.5–35.7)
MCV: 74 fL — ABNORMAL LOW (ref 79–97)
Monocytes Absolute: 0.4 10*3/uL (ref 0.1–0.9)
Monocytes: 5 %
Neutrophils Absolute: 4.5 10*3/uL (ref 1.4–7.0)
Neutrophils: 58 %
Platelets: 261 10*3/uL (ref 150–450)
RBC: 4.38 x10E6/uL (ref 3.77–5.28)
RDW: 17.5 % — ABNORMAL HIGH (ref 11.7–15.4)
WBC: 7.7 10*3/uL (ref 3.4–10.8)

## 2021-06-16 LAB — HIV-1/2 AB - DIFFERENTIATION
HIV 1 Ab: REACTIVE
HIV 2 Ab: NONREACTIVE

## 2021-06-16 MED ORDER — ENOXAPARIN SODIUM 60 MG/0.6ML IJ SOSY
0.5000 mg/kg | PREFILLED_SYRINGE | INTRAMUSCULAR | Status: DC
  Administered 2021-06-16 – 2021-06-17 (×2): 45 mg via SUBCUTANEOUS
  Filled 2021-06-16 (×2): qty 0.6

## 2021-06-16 NOTE — Progress Notes (Signed)
PROGRESS NOTE    Megan [name]  YLT:[id number] DOB: April 28, 1971 DOA: 06/11/2021 PCP: [facility]   Brief Narrative:  Megan [name] is a 50 y.o. female with past medical history of hypothyroidism, asthma, obesity presented to hospital with left-sided facial and scalp swelling pain and rash with tingling sensation.  Patient had initially came to the ED when a CT scan and MRI of the brain was done and had gone home but subsequently started developing blisters with sharp pain in presented to her PCP who diagnosed her with herpes zoster and was started on valacyclovir and steroid.  She was not able to swallow pills at home so she was brought back to the hospital.  In the ED fluorescein dye showed several left corneal lesions and ophthalmology was contacted.  Currently on IV acyclovir eyedrops and steroids.    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  Confirmatory test has been sent.  CD4 count has been sent as well.  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  Ophthalmology on board.  On IV acyclovir at this time.  Was unable to tolerate large pills.  On acyclovir 5 times daily for total of 7 days.  Today day 6/ 7.  Ophthalmology follow the patient during hospitalization.  No need for contact isolation.  Continue pain control.  On Solu-Medrol IV as well including eyedrops.     dysphagia Likely secondary to lip and buccal mucosal lesions.  Seen by speech therapy.  On pured diet.  Diet has been advanced at this time.  Elevated LFTs Hepatitis panel was negative.  Right upper quadrant ultrasound with fatty liver.  Negative for gallstones.  We will continue to monitor as outpatient.     Asthma/Depression/Hypothyroid/Chronic microcytic anemia No acute issues at this  time.   Iron deficiency anemia. Iron saturation was 6%.  Continue oral iron supplement.  Repeat iron profile in 3 to 6 months.  Vitamin B12 level 260, target >400, started vitamin B12 1000 mcg IM injection during hospital stay and oral supplement on discharge.   Vitamin D deficiency, started vitamin D 50,000 units p.o. weekly.    DVT prophylaxis: Lovenox SQ   Code Status: full code   Family Communication: None.  Disposition Plan:    Not medically stable so far.  Likely home in 1 to 2 days  Consults called: Ophthalmology  Admission status: Inpatient   Procedures:   Antimicrobials:  Acyclovir  Subjective: Today, patient states that she still has some tingling on her scalp.  Denies any nausea vomiting shortness of breath cough fever chills.  Objective: Vitals:   06/15/21 1553 06/15/21 2010 06/16/21 0544 06/16/21 0731  BP: 139/80 (!) 147/88 (!) 150/98 (!) 161/103  Pulse: 88 81 83 84  Resp: 20 14 14 14   Temp: 97.8 F (36.6 C) 98 F (36.7 C) 97.7 F (36.5 C) 97.6 F (36.4 C)  TempSrc: Oral Oral Oral   SpO2: 96% 98% 97% 98%  Weight:      Height:        Intake/Output Summary (Last 24 hours) at 06/16/2021 1016 Last data filed at 06/15/2021 2100 Gross per 24 hour  Intake --  Output 500 ml  Net -500 ml    Filed Weights   06/11/21 1128  Weight: 91 kg   Physical  examination: General: Obese built, not in obvious distress HENT:   No scleral pallor or icterus noted. Oral mucosa is moist.  No obvious erythema of the eye.  Dried lesions over the lower half of the face. Chest:  Clear breath sounds.  Diminished breath sounds bilaterally. No crackles or wheezes.  CVS: S1 &S2 heard. No murmur.  Regular rate and rhythm. Abdomen: Soft, nontender, nondistended.  Bowel sounds are heard.   Extremities: No cyanosis, clubbing or edema.  Peripheral pulses are palpable. Psych: Alert, awake and oriented, normal mood CNS:  No cranial nerve deficits.  Power equal in all  extremities.   Skin: Warm and dry.  Dry lesions on the lips    Data Reviewed: I have personally reviewed following labs and imaging studies  CBC: Recent Labs  Lab 06/11/21 1245 06/12/21 0657 06/13/21 0519 06/14/21 0450 06/15/21 0449 06/16/21 0423  WBC 7.0 7.1 6.6 6.7 7.4 8.2  NEUTROABS 3.5  --   --   --   --  4.1  HGB 10.1* 9.7* 9.3* 9.1* 9.2* 9.6*  HCT 32.9* 30.5* 31.1* 30.5* 29.2* 30.7*  MCV 72.8* 73.5* 72.8* 72.8* 72.8* 72.2*  PLT 211 229 228 231 242 271    Basic Metabolic Panel: Recent Labs  Lab 06/12/21 0657 06/13/21 0519 06/14/21 0450 06/15/21 0449 06/16/21 0423  NA 134* 136 138 136 136  K 3.9 3.8 3.7 3.6 3.6  CL 104 107 106 107 106  CO2 24 25 25 24 24   GLUCOSE 144* 94 88 91 99  BUN 13 15 15 12 17   CREATININE 0.75 0.88 0.81 0.77 0.95  CALCIUM 9.0 8.7* 8.7* 8.7* 9.0  MG  --  2.0 1.9 1.9  --   PHOS  --  3.1 3.1 3.0  --     GFR: Estimated Creatinine Clearance: 80.7 mL/min (by C-G formula based on SCr of 0.95 mg/dL). Liver Function Tests: Recent Labs  Lab 06/11/21 1322 06/12/21 0657 06/14/21 0450 06/15/21 0449 06/16/21 0423  AST 80* 82* 73* 95* 67*  ALT 65* 70* 66* 92* 81*  ALKPHOS 45 50 42 41 42  BILITOT 1.0 1.1 0.7 1.1 0.8  PROT 8.0 8.4* 7.6 8.1 7.5  ALBUMIN 3.5 3.6 3.0* 3.2* 3.3*    No results for input(s): LIPASE, AMYLASE in the last 168 hours. No results for input(s): AMMONIA in the last 168 hours. Coagulation Profile: No results for input(s): INR, PROTIME in the last 168 hours. Cardiac Enzymes: Recent Labs  Lab 06/11/21 1322  CKTOTAL 33*    BNP (last 3 results) No results for input(s): PROBNP in the last 8760 hours. HbA1C: No results for input(s): HGBA1C in the last 72 hours. CBG: No results for input(s): GLUCAP in the last 168 hours. Lipid Profile: No results for input(s): CHOL, HDL, LDLCALC, TRIG, CHOLHDL, LDLDIRECT in the last 72 hours. Thyroid Function Tests: No results for input(s): TSH, T4TOTAL, FREET4, T3FREE, THYROIDAB  in the last 72 hours. Anemia Panel: No results for input(s): VITAMINB12, FOLATE, FERRITIN, TIBC, IRON, RETICCTPCT in the last 72 hours. Sepsis Labs: No results for input(s): PROCALCITON, LATICACIDVEN in the last 168 hours.  Recent Results (from the past 240 hour(s))  Resp Panel by RT-PCR (Flu A&B, Covid) Nasopharyngeal Swab     Status: None   Collection Time: 06/11/21 12:45 PM   Specimen: Nasopharyngeal Swab; Nasopharyngeal(NP) swabs in vial transport medium  Result Value Ref Range Status   SARS Coronavirus 2 by RT PCR NEGATIVE NEGATIVE Final    Comment: (NOTE) SARS-CoV-2 target nucleic  acids are NOT DETECTED.  The SARS-CoV-2 RNA is generally detectable in upper respiratory specimens during the acute phase of infection. The lowest concentration of SARS-CoV-2 viral copies this assay can detect is 138 copies/mL. A negative result does not preclude SARS-Cov-2 infection and should not be used as the sole basis for treatment or other patient management decisions. A negative result may occur with  improper specimen collection/handling, submission of specimen other than nasopharyngeal swab, presence of viral mutation(s) within the areas targeted by this assay, and inadequate number of viral copies(<138 copies/mL). A negative result must be combined with clinical observations, patient history, and epidemiological information. The expected result is Negative.  Fact Sheet for Patients:  BloggerCourse.com  Fact Sheet for Healthcare Providers:  SeriousBroker.it  This test is no t yet approved or cleared by the [place] FDA and  has been authorized for detection and/or diagnosis of SARS-CoV-2 by FDA under an Emergency Use Authorization (EUA). This EUA will remain  in effect (meaning this test can be used) for the duration of the COVID-19 declaration under Section 564(b)(1) of the Act, 21 U.S.C.section 360bbb-3(b)(1), unless the  authorization is terminated  or revoked sooner.       Influenza A by PCR NEGATIVE NEGATIVE Final   Influenza B by PCR NEGATIVE NEGATIVE Final    Comment: (NOTE) The Xpert Xpress SARS-CoV-2/FLU/RSV plus assay is intended as an aid in the diagnosis of influenza from Nasopharyngeal swab specimens and should not be used as a sole basis for treatment. Nasal washings and aspirates are unacceptable for Xpert Xpress SARS-CoV-2/FLU/RSV testing.  Fact Sheet for Patients: BloggerCourse.com  Fact Sheet for Healthcare Providers: SeriousBroker.it  This test is not yet approved or cleared by the [place] FDA and has been authorized for detection and/or diagnosis of SARS-CoV-2 by FDA under an Emergency Use Authorization (EUA). This EUA will remain in effect (meaning this test can be used) for the duration of the COVID-19 declaration under Section 564(b)(1) of the Act, 21 U.S.C. section 360bbb-3(b)(1), unless the authorization is terminated or revoked.  Performed at [facility], [address] [place], [place] [address]       Radiology Studies: CT HEAD WO CONTRAST ( )  Result Date: 06/13/2021 CLINICAL DATA:  Left-sided weakness EXAM: CT HEAD WITHOUT CONTRAST TECHNIQUE: Contiguous axial images were obtained from the base of the skull through the vertex without intravenous contrast. COMPARISON:  06/08/2021 FINDINGS: Brain: There is no mass, hemorrhage or extra-axial collection. The size and configuration of the ventricles and extra-axial CSF spaces are normal. The brain parenchyma is normal, without acute or chronic infarction. Vascular: No abnormal hyperdensity of the major intracranial arteries or dural venous sinuses. No intracranial atherosclerosis. Skull: The visualized skull base, calvarium and extracranial soft tissues are normal. Sinuses/Orbits: No fluid levels or advanced mucosal thickening of the visualized paranasal  sinuses. No mastoid or middle ear effusion. The orbits are normal. IMPRESSION: Normal head CT. Electronically Signed   By: Deatra Robinson M.D.   On: 06/13/2021 19:11   CT HEAD WO CONTRAST  Result Date: 06/08/2021 CLINICAL DATA:  Numbness/Tingling unilateral. Paresthesia involving the lips, face, and scalp EXAM: CT HEAD WITHOUT CONTRAST TECHNIQUE: Contiguous axial images were obtained from the base of the skull through the vertex without intravenous contrast. COMPARISON:  None. FINDINGS: Brain: Normal anatomic configuration. No abnormal intra or extra-axial mass lesion or fluid collection. No abnormal mass effect or midline shift. No evidence of acute intracranial hemorrhage or infarct. Ventricular size is normal. Cerebellum unremarkable. Vascular:  Unremarkable Skull: Intact Sinuses/Orbits: There is mucosal thickening within the left sphenoid sinus and bubbly secretions within the posterior right ethmoid air cell. Remaining paranasal sinuses are clear. Orbits are unremarkable. Other: Mastoid air cells and middle ear cavities are clear. IMPRESSION: No acute intracranial abnormality. Mild paranasal sinus disease. Electronically Signed   By: Helyn Numbers M.D.   On: 06/08/2021 00:57   CT Soft Tissue Neck W Contrast  Result Date: 06/08/2021 CLINICAL DATA:  50 year old female with abnormal numbness and tingling involving the lips, face, and scalp. Adenoid hypertrophy and questionable cervical lymphadenopathy on brain MRI earlier today. EXAM: CT NECK WITH CONTRAST TECHNIQUE: Multidetector CT imaging of the neck was performed using the standard protocol following the bolus administration of intravenous contrast. CONTRAST:  5mL OMNIPAQUE IOHEXOL 350 MG/ML SOLN COMPARISON:  Brain MRI 0336 hours. FINDINGS: Pharynx and larynx: Negative larynx. Hypopharynx within normal limits. Palatine tonsils appear to be present and relatively normal, symmetric. There is generalized adenoid hypertrophy (series 2, image 19). No  heterogeneous adenoid enhancement. Negative parapharyngeal spaces. Retropharyngeal space is remarkable for both prominent retropharyngeal lymph nodes (8 mm short axis series 2, image 30) and of partially retropharyngeal course of both carotid arteries. Salivary glands: Negative sublingual space. Negative submandibular glands. Parotid glands appear negative aside from mild prominence of subcentimeter bilateral parotid space lymph nodes. Thyroid: Negative. Lymph nodes: Lymph nodes throughout the visible bilateral neck are increased in number, and many nodes are approaching 10 mm short axis. The largest nodes are 12-13 mm short axis, including at the bilateral level 2 and left level 1A nodal stations. No cystic or necrotic nodes. Vascular: Partially retropharyngeal course of both carotids. The major vascular structures in the neck and at the skull base are patent. Limited intracranial: Negative. Visualized orbits: Negative. Mastoids and visualized paranasal sinuses: Mild bubbly opacity and mucosal thickening in the posterior right ethmoid air cell. Similar mucosal thickening in the left sphenoid sinus and right maxillary alveolar recess. Tympanic cavities and visible mastoids are clear. Skeleton: No acute dental finding. Congenital incomplete ossification of the posterior C1 ring. C5-C6 cervical disc and endplate degeneration. No acute or suspicious osseous lesion. Upper chest: Visible bilateral axillary lymph nodes are small but appear increased in number (series 2, image 88 on the right). No superior mediastinal lymphadenopathy is identified. Lung apices are clear. IMPRESSION: 1. Generalized adenoid hypertrophy and bilateral cervical lymph nodes with diffuse mild increase in size and number. Numerous maximal 10 mm nodes, and the largest bilateral largest nodes are 12-13 mm short axis. No cystic or necrotic nodes, and the remaining pharynx appears normal. This appearance is nonspecific, and can be occasionally  physiologic in patients with large body habitus, but is suspicious for a Lymphoproliferative disorder. 2. Minor paranasal sinus inflammation, significance doubtful. Electronically Signed   By: Odessa Fleming M.D.   On: 06/08/2021 05:13   MR BRAIN WO CONTRAST  Result Date: 06/08/2021 CLINICAL DATA:  50 year old female with abnormal numbness and tingling involving the lips, face, and scalp. EXAM: MRI HEAD WITHOUT CONTRAST TECHNIQUE: Multiplanar, multiecho pulse sequences of the brain and surrounding structures were obtained without intravenous contrast. COMPARISON:  Head CT 0042 hours today. FINDINGS: Brain: Normal cerebral volume. No restricted diffusion to suggest acute infarction. No midline shift, mass effect, evidence of mass lesion, ventriculomegaly, extra-axial collection or acute intracranial hemorrhage. Cervicomedullary junction and pituitary are within normal limits. Wallace Cullens and white matter signal is within normal limits for age throughout the brain. No chronic cerebral blood products or encephalomalacia is  identified. Vascular: Major intracranial vascular flow voids are preserved. Skull and upper cervical spine: Negative. Sinuses/Orbits: Negative orbits. Mild ethmoid and sphenoid sinus mucosal thickening. No sinus fluid levels. Other: Trace fluid in the left mastoid air cells. There is symmetric adenoid hypertrophy (series 10, image 1). Coronal images suggest prominent cervical lymph nodes. But scalp and face soft tissues appear symmetric and negative. IMPRESSION: 1. Normal for age noncontrast MRI appearance of the brain. 2. Nonspecific adenoid hypertrophy and suggestion of prominent upper cervical lymph nodes. If there is any palpable lymphadenopathy in the neck then consider a lymphoproliferative disorder, and Neck CT with IV contrast might be valuable. Electronically Signed   By: Odessa Fleming M.D.   On: 06/08/2021 04:28   US Abdomen Limited RUQ (LIVER/GB)  Result Date: 06/11/2021 CLINICAL DATA:  Elevated LFT  EXAM: ULTRASOUND ABDOMEN LIMITED RIGHT UPPER QUADRANT COMPARISON:  None. FINDINGS: Gallbladder: No gallstones or wall thickening visualized. No sonographic Murphy sign noted by sonographer. Common bile duct: Diameter: 5.0 mm Liver: Fatty liver with fatty sparing adjacent to the falciform ligament. No mass lesion. Portal vein is patent on color Doppler imaging with normal direction of blood flow towards the liver. Other: None. IMPRESSION: Fatty liver with fatty sparing. Negative for gallstones or biliary dilatation. Electronically Signed   By: Marlan Palau M.D.   On: 06/11/2021 16:47     Scheduled Meds:  vitamin C  500 mg Oral Daily   cyanocobalamin  1,000 mcg Intramuscular Daily   dexamethasone  2 drop Both Eyes Q4H   enoxaparin (LOVENOX) injection  0.5 mg/kg Subcutaneous Q24H   erythromycin   Left Eye Q8H   feeding supplement  237 mL Oral TID BM   iron polysaccharides  150 mg Oral Daily   levothyroxine  88 mcg Oral QAC breakfast   methylPREDNISolone (SOLU-MEDROL) injection  40 mg Intravenous Q12H   PARoxetine  20 mg Oral Daily   [START ON 06/19/2021] vitamin B-12  500 mcg Oral Daily   Vitamin D (Ergocalciferol)  50,000 Units Oral Q7 days   Continuous Infusions:  sodium chloride 50 mL/hr at 06/15/21 2001   acyclovir 705 mg (06/16/21 0615)     LOS: 5 days   Joycelyn Das, MD [facility]  If 7PM-7AM, please contact night-coverage  06/16/2021, 10:16 AM

## 2021-06-17 LAB — CBC
HCT: 29.9 % — ABNORMAL LOW (ref 36.0–46.0)
Hemoglobin: 9.5 g/dL — ABNORMAL LOW (ref 12.0–15.0)
MCH: 23.4 pg — ABNORMAL LOW (ref 26.0–34.0)
MCHC: 31.8 g/dL (ref 30.0–36.0)
MCV: 73.6 fL — ABNORMAL LOW (ref 80.0–100.0)
Platelets: 279 10*3/uL (ref 150–400)
RBC: 4.06 MIL/uL (ref 3.87–5.11)
RDW: 19 % — ABNORMAL HIGH (ref 11.5–15.5)
WBC: 8.8 10*3/uL (ref 4.0–10.5)
nRBC: 0 % (ref 0.0–0.2)

## 2021-06-17 LAB — COMPREHENSIVE METABOLIC PANEL
ALT: 71 U/L — ABNORMAL HIGH (ref 0–44)
AST: 56 U/L — ABNORMAL HIGH (ref 15–41)
Albumin: 3.4 g/dL — ABNORMAL LOW (ref 3.5–5.0)
Alkaline Phosphatase: 42 U/L (ref 38–126)
Anion gap: 5 (ref 5–15)
BUN: 16 mg/dL (ref 6–20)
CO2: 25 mmol/L (ref 22–32)
Calcium: 9.1 mg/dL (ref 8.9–10.3)
Chloride: 105 mmol/L (ref 98–111)
Creatinine, Ser: 0.79 mg/dL (ref 0.44–1.00)
GFR, Estimated: >60 mL/min (ref >60)
Glucose, Bld: 136 mg/dL — ABNORMAL HIGH (ref 70–99)
Potassium: 4.2 mmol/L (ref 3.5–5.1)
Sodium: 135 mmol/L (ref 135–145)
Total Bilirubin: 1 mg/dL (ref 0.3–1.2)
Total Protein: 7.9 g/dL (ref 6.5–8.1)

## 2021-06-17 LAB — MAGNESIUM: Magnesium: 1.8 mg/dL (ref 1.7–2.4)

## 2021-06-17 MED ORDER — HYDRALAZINE HCL 20 MG/ML IJ SOLN
10.0000 mg | INTRAMUSCULAR | Status: DC | PRN
  Filled 2021-06-17: qty 1

## 2021-06-17 NOTE — Consult Note (Signed)
Infectious Disease     Reason for Consult:HIV, VZV    Referring Physician: Dr Louanne Belton Date of Admission:  06/11/2021   Active Problems:   Herpes zoster dermatitis with ophthalmic complication   Shingles   HPI: Megan [name] is a 50 y.o. female who was admitted on October 12 with left-sided facial pain and swelling and rash.  She had initially presented to the ED a few days prior with numbness tingling and sensation.  She had a work-up including CT and MRI of the head that were negative.  She then started to develop blisters and rash in the left side of her face and lip with sharp pain.  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  However she went home and was unable to swallow pills and also started to have light sensitivity in the left eye.  She was also having some tingling pain in her left ear.  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  She was seen by ophthalmology.  There is some corneal involvement noted.  She was started on IV acyclovir.  She also did an HIV test done which has turned positive.  CD4 was tested and is 442 or 17%.  She had elevated LFTs as well but hep A, B, and C are negative.  She also has an anemia with a hemoglobin of 9.5. Of note when she had her MRI of her brain was normal of the brain but there was nonspecific adenoid hypertrophy and suggestion of prominent upper cervical lymph nodes.  They suggested a neck CT to evaluate for further lymphadenopathy.  There was some concern of a lymphoproliferative disorder.  CT showed generalized adenoid hypertrophy and bilateral cervical lymph nodes with diffuse mild increase in size and number.  She reports she is in a relationship with her fiancee of 3 years. Denies IVDU. Reports having HIV tests yearly at [facility].   Past Medical History:  Diagnosis Date   Anxiety    Asthma    COVID-19 virus infection 08/2020   Hypothyroidism    Morbid obesity ([place])    Past  Surgical History:  Procedure Laterality Date   BREAST BIOPSY Right 11/11/2020   [place] bx, vision marker, - FRAGMENTS OF BENIGN INTRAMAMMARY LYMPH NODE.  NEGATIVE FOR ATYPICAL PROLIFERATIVE BREAST DISEASE.   BREAST BIOPSY Left 11/11/2020`   [place] bx, Q shape, FIBROEPITHELIAL PROLIFERATION WITH SCLEROSIS. NEGATIVE FOR ATYPICAL PROLIFERATIVE BREAST DISEASE IN THIS SAMPLE.    BREAST BIOPSY Left 12/03/2020   [place] bx, vision clip, fiboadenoma   BREAST BIOPSY Left 12/03/2020   [place] bx, x-clip, fibroadenoma   BREAST BIOPSY Left 02/03/2021   Procedure: BREAST BIOPSY WITH NEEDLE LOCALIZATION;  Surgeon: Robert Bellow, MD;  Location: [facility] ORS;  Service: General;  Laterality: Left;   BREAST LUMPECTOMY Left 02/03/2021   LT 2:00 X clip NL done prior to lumpectomy    CESAREAN SECTION     FOOT SURGERY     bone spur   TUBAL LIGATION     Social History   Tobacco Use   Smoking status: Never   Smokeless tobacco: Never  Vaping Use   Vaping Use: Never used  Substance Use Topics   Alcohol use: No   Drug use: No   Family History  Problem Relation Age of Onset   Thyroid disease Mother    Hypertension Mother    Diabetes Mother    Rheum arthritis Mother    Breast  cancer Maternal Grandmother        great grandmother   Cancer Paternal Grandmother        Pancreatic    Allergies: No Known Allergies  Current antibiotics: Antibiotics Given (last 72 hours)     Date/Time Action Medication Dose Rate   06/14/21 1447 New Bag/Given   acyclovir (ZOVIRAX) 705 mg in dextrose 5 % 150 mL IVPB 705 mg 164.1 mL/hr   06/14/21 2141 New Bag/Given   acyclovir (ZOVIRAX) 705 mg in dextrose 5 % 150 mL IVPB 705 mg 164.1 mL/hr   06/15/21 0551 New Bag/Given   acyclovir (ZOVIRAX) 705 mg in dextrose 5 % 150 mL IVPB 705 mg 164.1 mL/hr   06/15/21 1411 New Bag/Given   acyclovir (ZOVIRAX) 705 mg in dextrose 5 % 150 mL IVPB 705 mg 164.1 mL/hr   06/15/21 2136 New Bag/Given   acyclovir (ZOVIRAX) 705 mg in dextrose 5 % 150 mL IVPB 705  mg 164.1 mL/hr   06/16/21 0615 New Bag/Given   acyclovir (ZOVIRAX) 705 mg in dextrose 5 % 150 mL IVPB 705 mg 164.1 mL/hr   06/16/21 1335 New Bag/Given   acyclovir (ZOVIRAX) 705 mg in dextrose 5 % 150 mL IVPB 705 mg 164.1 mL/hr   06/16/21 2213 New Bag/Given   acyclovir (ZOVIRAX) 705 mg in dextrose 5 % 150 mL IVPB 705 mg 164.1 mL/hr   06/17/21 0620 New Bag/Given   acyclovir (ZOVIRAX) 705 mg in dextrose 5 % 150 mL IVPB 705 mg 164.1 mL/hr       MEDICATIONS:  vitamin C  500 mg Oral Daily   cyanocobalamin  1,000 mcg Intramuscular Daily   dexamethasone  2 drop Both Eyes Q4H   enoxaparin (LOVENOX) injection  0.5 mg/kg Subcutaneous Q24H   erythromycin   Left Eye Q8H   feeding supplement  237 mL Oral TID BM   iron polysaccharides  150 mg Oral Daily   levothyroxine  88 mcg Oral QAC breakfast   methylPREDNISolone (SOLU-MEDROL) injection  40 mg Intravenous Q12H   PARoxetine  20 mg Oral Daily   [START ON 06/19/2021] vitamin B-12  500 mcg Oral Daily   Vitamin D (Ergocalciferol)  50,000 Units Oral Q7 days    Review of Systems - 11 systems reviewed and negative per HPI   OBJECTIVE: Temp:  [97.8 F (36.6 C)-98.9 F (37.2 C)] 98.6 F (37 C) (10/18 0908) Pulse Rate:  [69-99] 69 (10/18 0908) Resp:  [16-20] 18 (10/18 0908) BP: (141-185)/(89-115) 152/92 (10/18 0908) SpO2:  [97 %-100 %] 100 % (10/18 0908) Physical Exam  Constitutional:  oriented to person, place, and time. appears well-developed and well-nourished. No distress.  HENT: [place]/AT, PERRLA, no scleral icterus Mouth/Throat: Oropharynx is clear and moist.  Healing scabs on L face and lips.  Cardiovascular: Normal rate, regular rhythm and normal heart sounds. Exam reveals no gallop and no friction rub.  No murmur heard.  Pulmonary/Chest: Effort normal and breath sounds normal. No respiratory distress.  has no wheezes.  Neck = supple, no nuchal rigidity Abdominal: Soft. Bowel sounds are normal.  exhibits no distension. There is no  tenderness.  Lymphadenopathy: no cervical adenopathy. No axillary adenopathy Neurological: alert and oriented to person, place, and time.  Skin: Skin is warm and dry. No rash noted. No erythema.  Psychiatric: a normal mood and affect.  behavior is normal.    LABS: Results for orders placed or performed during the hospital encounter of 06/11/21 (from the past 48 hour(s))  Helper T-Lymph-CD4 [facility] only)  Status: Abnormal   Collection Time: 06/15/21  2:59 PM  Result Value Ref Range   Absolute CD 4 Helper 442 359 - 1,519 /uL   % CD 4 Pos. Lymph. 17.0 (L) 30.8 - 58.5 %   WBC 7.7 3.4 - 10.8 x10E3/uL   RBC 4.38 3.77 - 5.28 x10E6/uL   Hematocrit 32.3 (L) 34.0 - 46.6 %   MCV 74 (L) 79 - 97 fL   MCH 21.9 (L) 26.6 - 33.0 pg   MCHC 29.7 (L) 31.5 - 35.7 g/dL   RDW 17.5 (H) 11.7 - 15.4 %   Platelets 261 150 - 450 x10E3/uL   Neutrophils 58 Not Estab. %   Lymphs 34 Not Estab. %   Monocytes 5 Not Estab. %   Eos 2 Not Estab. %   Basos 0 Not Estab. %   Neutrophils Absolute 4.5 1.4 - 7.0 x10E3/uL   Lymphocytes Absolute 2.6 0.7 - 3.1 x10E3/uL   Monocytes Absolute 0.4 0.1 - 0.9 x10E3/uL   EOS (ABSOLUTE) 0.2 0.0 - 0.4 x10E3/uL   Basophils Absolute 0.0 0.0 - 0.2 x10E3/uL   Immature Granulocytes 1 Not Estab. %   Immature Grans (Abs) 0.1 0.0 - 0.1 x10E3/uL    Comment: (NOTE) Performed At: [facility] [place], [place] [id number] Rush Farmer MD [id number]    Hemoglobin 9.6 (L) 11.1 - 15.9 g/dL  CBC with Differential/Platelet     Status: Abnormal   Collection Time: 06/16/21  4:23 AM  Result Value Ref Range   WBC 8.2 4.0 - 10.5 K/uL   RBC 4.25 3.87 - 5.11 MIL/uL   Hemoglobin 9.6 (L) 12.0 - 15.0 g/dL   HCT 30.7 (L) 36.0 - 46.0 %   MCV 72.2 (L) 80.0 - 100.0 fL   MCH 22.6 (L) 26.0 - 34.0 pg   MCHC 31.3 30.0 - 36.0 g/dL   RDW 18.6 (H) 11.5 - 15.5 %   Platelets 271 150 - 400 K/uL   nRBC 0.0 0.0 - 0.2 %   Neutrophils Relative % 49 %   Neutro Abs 4.1 1.7 - 7.7 K/uL    Lymphocytes Relative 40 %   Lymphs Abs 3.3 0.7 - 4.0 K/uL   Monocytes Relative 10 %   Monocytes Absolute 0.8 0.1 - 1.0 K/uL   Eosinophils Relative 0 %   Eosinophils Absolute 0.0 0.0 - 0.5 K/uL   Basophils Relative 0 %   Basophils Absolute 0.0 0.0 - 0.1 K/uL   Immature Granulocytes 1 %   Abs Immature Granulocytes 0.05 0.00 - 0.07 K/uL    Comment: Performed at [facility], [address] [place], [place] [address]  Comprehensive metabolic panel     Status: Abnormal   Collection Time: 06/16/21  4:23 AM  Result Value Ref Range   Sodium 136 135 - 145 mmol/L   Potassium 3.6 3.5 - 5.1 mmol/L   Chloride 106 98 - 111 mmol/L   CO2 24 22 - 32 mmol/L   Glucose, Bld 99 70 - 99 mg/dL    Comment: Glucose reference range applies only to samples taken after fasting for at least 8 hours.   BUN 17 6 - 20 mg/dL   Creatinine, Ser 0.95 0.44 - 1.00 mg/dL   Calcium 9.0 8.9 - 10.3 mg/dL   Total Protein 7.5 6.5 - 8.1 g/dL   Albumin 3.3 (L) 3.5 - 5.0 g/dL   AST 67 (H) 15 - 41 U/L   ALT 81 (H) 0 - 44 U/L   Alkaline  Phosphatase 42 38 - 126 U/L   Total Bilirubin 0.8 0.3 - 1.2 mg/dL   GFR, Estimated >60 >60 mL/min    Comment: (NOTE) Calculated using the CKD-EPI Creatinine Equation (2021)    Anion gap 6 5 - 15    Comment: Performed at [facility], [address] [place], [place] [address]  Magnesium     Status: None   Collection Time: 06/17/21  4:34 AM  Result Value Ref Range   Magnesium 1.8 1.7 - 2.4 mg/dL    Comment: Performed at [facility], [address] [place], [place] [address]  CBC     Status: Abnormal   Collection Time: 06/17/21  4:34 AM  Result Value Ref Range   WBC 8.8 4.0 - 10.5 K/uL   RBC 4.06 3.87 - 5.11 MIL/uL   Hemoglobin 9.5 (L) 12.0 - 15.0 g/dL   HCT 29.9 (L) 36.0 - 46.0 %   MCV 73.6 (L) 80.0 - 100.0 fL   MCH 23.4 (L) 26.0 - 34.0 pg   MCHC 31.8 30.0 - 36.0 g/dL   RDW 19.0 (H) 11.5 - 15.5 %   Platelets 279 150 - 400 K/uL   nRBC 0.0 0.0 - 0.2 %     Comment: Performed at [facility], [address] [place], [place] [address]  Comprehensive metabolic panel     Status: Abnormal   Collection Time: 06/17/21  4:34 AM  Result Value Ref Range   Sodium 135 135 - 145 mmol/L   Potassium 4.2 3.5 - 5.1 mmol/L   Chloride 105 98 - 111 mmol/L   CO2 25 22 - 32 mmol/L   Glucose, Bld 136 (H) 70 - 99 mg/dL    Comment: Glucose reference range applies only to samples taken after fasting for at least 8 hours.   BUN 16 6 - 20 mg/dL   Creatinine, Ser 0.79 0.44 - 1.00 mg/dL   Calcium 9.1 8.9 - 10.3 mg/dL   Total Protein 7.9 6.5 - 8.1 g/dL   Albumin 3.4 (L) 3.5 - 5.0 g/dL   AST 56 (H) 15 - 41 U/L   ALT 71 (H) 0 - 44 U/L   Alkaline Phosphatase 42 38 - 126 U/L   Total Bilirubin 1.0 0.3 - 1.2 mg/dL   GFR, Estimated >60 >60 mL/min    Comment: (NOTE) Calculated using the CKD-EPI Creatinine Equation (2021)    Anion gap 5 5 - 15    Comment: Performed at [facility], [address] [place], [place] [address]   No components found for: ESR, C REACTIVE PROTEIN MICRO: Recent Results (from the past 720 hour(s))  Resp Panel by RT-PCR (Flu A&B, Covid) Nasopharyngeal Swab     Status: None   Collection Time: 06/11/21 12:45 PM   Specimen: Nasopharyngeal Swab; Nasopharyngeal(NP) swabs in vial transport medium  Result Value Ref Range Status   SARS Coronavirus 2 by RT PCR NEGATIVE NEGATIVE Final    Comment: (NOTE) SARS-CoV-2 target nucleic acids are NOT DETECTED.  The SARS-CoV-2 RNA is generally detectable in upper respiratory specimens during the acute phase of infection. The lowest concentration of SARS-CoV-2 viral copies this assay can detect is 138 copies/mL. A negative result does not preclude SARS-Cov-2 infection and should not be used as the sole basis for treatment or other patient management decisions. A negative result may occur with  improper specimen collection/handling, submission of specimen other than nasopharyngeal  swab, presence of viral mutation(s) within the areas targeted by this assay, and  inadequate number of viral copies(<138 copies/mL). A negative result must be combined with clinical observations, patient history, and epidemiological information. The expected result is Negative.  Fact Sheet for Patients:  EntrepreneurPulse.com.au  Fact Sheet for Healthcare Providers:  IncredibleEmployment.be  This test is no t yet approved or cleared by the [place] FDA and  has been authorized for detection and/or diagnosis of SARS-CoV-2 by FDA under an Emergency Use Authorization (EUA). This EUA will remain  in effect (meaning this test can be used) for the duration of the COVID-19 declaration under Section 564(b)(1) of the Act, 21 U.S.C.section 360bbb-3(b)(1), unless the authorization is terminated  or revoked sooner.       Influenza A by PCR NEGATIVE NEGATIVE Final   Influenza B by PCR NEGATIVE NEGATIVE Final    Comment: (NOTE) The Xpert Xpress SARS-CoV-2/FLU/RSV plus assay is intended as an aid in the diagnosis of influenza from Nasopharyngeal swab specimens and should not be used as a sole basis for treatment. Nasal washings and aspirates are unacceptable for Xpert Xpress SARS-CoV-2/FLU/RSV testing.  Fact Sheet for Patients: EntrepreneurPulse.com.au  Fact Sheet for Healthcare Providers: IncredibleEmployment.be  This test is not yet approved or cleared by the [place] FDA and has been authorized for detection and/or diagnosis of SARS-CoV-2 by FDA under an Emergency Use Authorization (EUA). This EUA will remain in effect (meaning this test can be used) for the duration of the COVID-19 declaration under Section 564(b)(1) of the Act, 21 U.S.C. section 360bbb-3(b)(1), unless the authorization is terminated or revoked.  Performed at [facility], [address] [place], [place] [address]      IMAGING: CT HEAD WO CONTRAST (5MM)  Result Date: 06/13/2021 CLINICAL DATA:  Left-sided weakness EXAM: CT HEAD WITHOUT CONTRAST TECHNIQUE: Contiguous axial images were obtained from the base of the skull through the vertex without intravenous contrast. COMPARISON:  06/08/2021 FINDINGS: Brain: There is no mass, hemorrhage or extra-axial collection. The size and configuration of the ventricles and extra-axial CSF spaces are normal. The brain parenchyma is normal, without acute or chronic infarction. Vascular: No abnormal hyperdensity of the major intracranial arteries or dural venous sinuses. No intracranial atherosclerosis. Skull: The visualized skull base, calvarium and extracranial soft tissues are normal. Sinuses/Orbits: No fluid levels or advanced mucosal thickening of the visualized paranasal sinuses. No mastoid or middle ear effusion. The orbits are normal. IMPRESSION: Normal head CT. Electronically Signed   By: Ulyses Jarred M.D.   On: 06/13/2021 19:11   CT HEAD WO CONTRAST  Result Date: 06/08/2021 CLINICAL DATA:  Numbness/Tingling unilateral. Paresthesia involving the lips, face, and scalp EXAM: CT HEAD WITHOUT CONTRAST TECHNIQUE: Contiguous axial images were obtained from the base of the skull through the vertex without intravenous contrast. COMPARISON:  None. FINDINGS: Brain: Normal anatomic configuration. No abnormal intra or extra-axial mass lesion or fluid collection. No abnormal mass effect or midline shift. No evidence of acute intracranial hemorrhage or infarct. Ventricular size is normal. Cerebellum unremarkable. Vascular: Unremarkable Skull: Intact Sinuses/Orbits: There is mucosal thickening within the left sphenoid sinus and bubbly secretions within the posterior right ethmoid air cell. Remaining paranasal sinuses are clear. Orbits are unremarkable. Other: Mastoid air cells and middle ear cavities are clear. IMPRESSION: No acute intracranial abnormality. Mild paranasal sinus disease.  Electronically Signed   By: Fidela Salisbury M.D.   On: 06/08/2021 00:57   CT Soft Tissue Neck W Contrast  Result Date: 06/08/2021 CLINICAL DATA:  50 year old female with abnormal numbness and tingling involving the lips, face, and  scalp. Adenoid hypertrophy and questionable cervical lymphadenopathy on brain MRI earlier today. EXAM: CT NECK WITH CONTRAST TECHNIQUE: Multidetector CT imaging of the neck was performed using the standard protocol following the bolus administration of intravenous contrast. CONTRAST:  43m OMNIPAQUE IOHEXOL 350 MG/ML SOLN COMPARISON:  Brain MRI 0336 hours. FINDINGS: Pharynx and larynx: Negative larynx. Hypopharynx within normal limits. Palatine tonsils appear to be present and relatively normal, symmetric. There is generalized adenoid hypertrophy (series 2, image 19). No heterogeneous adenoid enhancement. Negative parapharyngeal spaces. Retropharyngeal space is remarkable for both prominent retropharyngeal lymph nodes (8 mm short axis series 2, image 30) and of partially retropharyngeal course of both carotid arteries. Salivary glands: Negative sublingual space. Negative submandibular glands. Parotid glands appear negative aside from mild prominence of subcentimeter bilateral parotid space lymph nodes. Thyroid: Negative. Lymph nodes: Lymph nodes throughout the visible bilateral neck are increased in number, and many nodes are approaching 10 mm short axis. The largest nodes are 12-13 mm short axis, including at the bilateral level 2 and left level 1A nodal stations. No cystic or necrotic nodes. Vascular: Partially retropharyngeal course of both carotids. The major vascular structures in the neck and at the skull base are patent. Limited intracranial: Negative. Visualized orbits: Negative. Mastoids and visualized paranasal sinuses: Mild bubbly opacity and mucosal thickening in the posterior right ethmoid air cell. Similar mucosal thickening in the left sphenoid sinus and right maxillary  alveolar recess. Tympanic cavities and visible mastoids are clear. Skeleton: No acute dental finding. Congenital incomplete ossification of the posterior C1 ring. C5-C6 cervical disc and endplate degeneration. No acute or suspicious osseous lesion. Upper chest: Visible bilateral axillary lymph nodes are small but appear increased in number (series 2, image 88 on the right). No superior mediastinal lymphadenopathy is identified. Lung apices are clear. IMPRESSION: 1. Generalized adenoid hypertrophy and bilateral cervical lymph nodes with diffuse mild increase in size and number. Numerous maximal 10 mm nodes, and the largest bilateral largest nodes are 12-13 mm short axis. No cystic or necrotic nodes, and the remaining pharynx appears normal. This appearance is nonspecific, and can be occasionally physiologic in patients with large body habitus, but is suspicious for a Lymphoproliferative disorder. 2. Minor paranasal sinus inflammation, significance doubtful. Electronically Signed   By: HGenevie AnnM.D.   On: 06/08/2021 05:13   MR BRAIN WO CONTRAST  Result Date: 06/08/2021 CLINICAL DATA:  50year old female with abnormal numbness and tingling involving the lips, face, and scalp. EXAM: MRI HEAD WITHOUT CONTRAST TECHNIQUE: Multiplanar, multiecho pulse sequences of the brain and surrounding structures were obtained without intravenous contrast. COMPARISON:  Head CT 0042 hours today. FINDINGS: Brain: Normal cerebral volume. No restricted diffusion to suggest acute infarction. No midline shift, mass effect, evidence of mass lesion, ventriculomegaly, extra-axial collection or acute intracranial hemorrhage. Cervicomedullary junction and pituitary are within normal limits. GPearline Cablesand white matter signal is within normal limits for age throughout the brain. No chronic cerebral blood products or encephalomalacia is identified. Vascular: Major intracranial vascular flow voids are preserved. Skull and upper cervical spine: Negative.  Sinuses/Orbits: Negative orbits. Mild ethmoid and sphenoid sinus mucosal thickening. No sinus fluid levels. Other: Trace fluid in the left mastoid air cells. There is symmetric adenoid hypertrophy (series 10, image 1). Coronal images suggest prominent cervical lymph nodes. But scalp and face soft tissues appear symmetric and negative. IMPRESSION: 1. Normal for age noncontrast MRI appearance of the brain. 2. Nonspecific adenoid hypertrophy and suggestion of prominent upper cervical lymph nodes. If there is any  palpable lymphadenopathy in the neck then consider a lymphoproliferative disorder, and Neck CT with IV contrast might be valuable. Electronically Signed   By: Genevie Ann M.D.   On: 06/08/2021 04:28   US Abdomen Limited RUQ (LIVER/GB)  Result Date: 06/11/2021 CLINICAL DATA:  Elevated LFT EXAM: ULTRASOUND ABDOMEN LIMITED RIGHT UPPER QUADRANT COMPARISON:  None. FINDINGS: Gallbladder: No gallstones or wall thickening visualized. No sonographic Murphy sign noted by sonographer. Common bile duct: Diameter: 5.0 mm Liver: Fatty liver with fatty sparing adjacent to the falciform ligament. No mass lesion. Portal vein is patent on color Doppler imaging with normal direction of blood flow towards the liver. Other: None. IMPRESSION: Fatty liver with fatty sparing. Negative for gallstones or biliary dilatation. Electronically Signed   By: Franchot Gallo M.D.   On: 06/11/2021 16:47    Assessment:   Megan [name] is a 50 y.o. 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 She reports regular HIV tests at [facility], denies IVDU. She has told her fiancee and he will get tested. CD4 is ok at 420.  Recommendations For VZV can change to oral valtrex 1000 mg tid to complete 10 days total treatment. For HIV we have ordered further lab testing.  I discussed starting treatment while hospitalized but she is not ready to start yet and wants to discuss with her fiancee and await the final studies.  I have given her  contact for [facility], and will set her up to see me in 1 week in clinic. She will need [employer] [facility] funding so would be best long term to fu at [facility] for DC tomorrow if stable Thank you very much for allowing me to participate in the care of this patient. Please call with questions.   Cheral Marker. Ola Spurr, MD

## 2021-06-17 NOTE — Progress Notes (Addendum)
PROGRESS NOTE    Megan [name]  WVP:[id number] DOB: 01/07/1971 DOA: 06/11/2021 PCP: [facility]   Brief Narrative:   Megan [name] is a 50 y.o. female with past medical history of hypothyroidism, asthma, obesity presented to the hospital with left-sided facial and scalp swelling pain and rash with tingling sensation.  Patient had initially came to the ED when a CT scan and MRI of the brain was done and had gone home but subsequently started developing blisters with sharp pain so presented to her PCP who diagnosed her with herpes zoster and was started on valacyclovir and steroid.  She was not able to swallow pills at home so she came to the hospital.  In the ED, fluorescein dye showed several left corneal lesions and ophthalmology was contacted.  Patient was then admitted hospital for further evaluation and treatment.   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  ID has been consulted.  ID has recommended outpatient follow-up for further discussion and treatment plan.  Patient is still in denial about the diagnosis.  Spoke with Dr.Fitzerald ID.  He stated that patient will have an appointment scheduled as outpatient.  Genotyping has been sent including STD screen.  CD4 absolute count of 442.  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  Ophthalmology on board.  I communicated with ophthalmology on-call Dr Caprice Red at [facility] eye center who recommended continuation of Valtrex 1000 mg p.o. 3 times daily for 3 days on discharge until evaluation in the clinic and continue steroid drops as well..  Patient could walk into the clinic for an appointment at [facility] eye care after discharge or they could see her in the hospital prior to discharge.  On IV acyclovir at this  time.  Was unable to tolerate large pills.  On acyclovir 5 times daily for total of 7 days.  Today day 7/ 7.  Spoke with ID and will complete IV acyclovir course today.  Patient will need to come continue Valtrex for 3 more days on discharge.     dysphagia Improved at this time.  Elevated LFTs likely fatty liver disease. Hepatitis panel was negative.  Right upper quadrant ultrasound with fatty liver.  Negative for gallstones.  We will continue to monitor as outpatient.    Asthma/Depression/Hypothyroid/Chronic microcytic anemia No acute issues at this time.   Iron deficiency anemia. Iron saturation was 6%.  Continue oral iron supplement.  Repeat iron profile in 3 to 6 months.  Vitamin B12 level 260, target >400, started vitamin B12 1000 mcg IM injection during hospital stay and oral supplement on discharge.   Vitamin D deficiency, started vitamin D 50,000 units p.o. weekly.    DVT prophylaxis: Lovenox SQ   Code Status: full code   Family Communication: None.  Disposition Plan:  Home likely tomorrow with ID and ophthalmology follow-up.  Consults called:  Ophthalmology,  infectious disease  Admission status: Inpatient   Procedures:   Antimicrobials:  Acyclovir  Subjective: Today, patient was seen and examined at bedside.  Overall feels okay.  Inquiring about the diagnosis of HIV.  No throat pain.  Mild blurring of vision.  No fever or chills.  Objective: Vitals:   06/17/21 0441 06/17/21 0614 06/17/21 0908 06/17/21 1235  BP: (!) 154/97 (!) 163/98 (!) 152/92 136/88  Pulse: 71  69 69 72  Resp:   18 16  Temp:   98.6 F (37 C) 98.2 F (36.8 C)  TempSrc:   Oral Oral  SpO2:   100% 97%  Weight:      Height:        Intake/Output Summary (Last 24 hours) at 06/17/2021 1435 Last data filed at 06/17/2021 1024 Gross per 24 hour  Intake 360 ml  Output --  Net 360 ml    Filed Weights   06/11/21 1128  Weight: 91 kg   Body mass index is 33.38 kg/m.   Physical  examination: General: Obese built, not in obvious distress HENT:   No scleral pallor or icterus noted. Oral mucosa is moist.  No obvious erythema of the eye.  Dried lesions over the lower half of the face. Chest:  Clear breath sounds.  Diminished breath sounds bilaterally. No crackles or wheezes.  CVS: S1 &S2 heard. No murmur.  Regular rate and rhythm. Abdomen: Soft, nontender, nondistended.  Bowel sounds are heard.   Extremities: No cyanosis, clubbing or edema.  Peripheral pulses are palpable. Psych: Alert, awake and oriented, normal mood CNS:  No cranial nerve deficits.  Power equal in all extremities.   Skin: Warm and dry.  Dry lesions on the lips    Data Reviewed: I have personally reviewed following labs and imaging studies  CBC: Recent Labs  Lab 06/11/21 1245 06/12/21 0657 06/14/21 0450 06/15/21 0449 06/15/21 1459 06/16/21 0423 06/17/21 0434  WBC 7.0   < > 6.7 7.4 7.7 8.2 8.8  NEUTROABS 3.5  --   --   --  4.5 4.1  --   HGB 10.1*   < > 9.1* 9.2* 9.6* 9.6* 9.5*  HCT 32.9*   < > 30.5* 29.2* 32.3* 30.7* 29.9*  MCV 72.8*   < > 72.8* 72.8* 74* 72.2* 73.6*  PLT 211   < > 231 242 261 271 279   < > = values in this interval not displayed.    Basic Metabolic Panel: Recent Labs  Lab 06/13/21 0519 06/14/21 0450 06/15/21 0449 06/16/21 0423 06/17/21 0434  NA 136 138 136 136 135  K 3.8 3.7 3.6 3.6 4.2  CL 107 106 107 106 105  CO2 25 25 24 24 25   GLUCOSE 94 88 91 99 136*  BUN 15 15 12 17 16   CREATININE 0.88 0.81 0.77 0.95 0.79  CALCIUM 8.7* 8.7* 8.7* 9.0 9.1  MG 2.0 1.9 1.9  --  1.8  PHOS 3.1 3.1 3.0  --   --     GFR: Estimated Creatinine Clearance: 95.8 mL/min (by C-G formula based on SCr of 0.79 mg/dL). Liver Function Tests: Recent Labs  Lab 06/12/21 0657 06/14/21 0450 06/15/21 0449 06/16/21 0423 06/17/21 0434  AST 82* 73* 95* 67* 56*  ALT 70* 66* 92* 81* 71*  ALKPHOS 50 42 41 42 42  BILITOT 1.1 0.7 1.1 0.8 1.0  PROT 8.4* 7.6 8.1 7.5 7.9  ALBUMIN 3.6 3.0*  3.2* 3.3* 3.4*    No results for input(s): LIPASE, AMYLASE in the last 168 hours. No results for input(s): AMMONIA in the last 168 hours. Coagulation Profile: No results for input(s): INR, PROTIME in the last 168 hours. Cardiac Enzymes: Recent Labs  Lab 06/11/21 1322  CKTOTAL 33*    BNP (last 3 results) No results for input(s): PROBNP in the last 8760 hours. HbA1C: No results for input(s): HGBA1C in the last 72 hours. CBG: No results for input(s): GLUCAP in the last  168 hours. Lipid Profile: No results for input(s): CHOL, HDL, LDLCALC, TRIG, CHOLHDL, LDLDIRECT in the last 72 hours. Thyroid Function Tests: No results for input(s): TSH, T4TOTAL, FREET4, T3FREE, THYROIDAB in the last 72 hours. Anemia Panel: No results for input(s): VITAMINB12, FOLATE, FERRITIN, TIBC, IRON, RETICCTPCT in the last 72 hours. Sepsis Labs: No results for input(s): PROCALCITON, LATICACIDVEN in the last 168 hours.  Recent Results (from the past 240 hour(s))  Resp Panel by RT-PCR (Flu A&B, Covid) Nasopharyngeal Swab     Status: None   Collection Time: 06/11/21 12:45 PM   Specimen: Nasopharyngeal Swab; Nasopharyngeal(NP) swabs in vial transport medium  Result Value Ref Range Status   SARS Coronavirus 2 by RT PCR NEGATIVE NEGATIVE Final    Comment: (NOTE) SARS-CoV-2 target nucleic acids are NOT DETECTED.  The SARS-CoV-2 RNA is generally detectable in upper respiratory specimens during the acute phase of infection. The lowest concentration of SARS-CoV-2 viral copies this assay can detect is 138 copies/mL. A negative result does not preclude SARS-Cov-2 infection and should not be used as the sole basis for treatment or other patient management decisions. A negative result may occur with  improper specimen collection/handling, submission of specimen other than nasopharyngeal swab, presence of viral mutation(s) within the areas targeted by this assay, and inadequate number of viral copies(<138  copies/mL). A negative result must be combined with clinical observations, patient history, and epidemiological information. The expected result is Negative.  Fact Sheet for Patients:  BloggerCourse.com  Fact Sheet for Healthcare Providers:  SeriousBroker.it  This test is no t yet approved or cleared by the [place] FDA and  has been authorized for detection and/or diagnosis of SARS-CoV-2 by FDA under an Emergency Use Authorization (EUA). This EUA will remain  in effect (meaning this test can be used) for the duration of the COVID-19 declaration under Section 564(b)(1) of the Act, 21 U.S.C.section 360bbb-3(b)(1), unless the authorization is terminated  or revoked sooner.       Influenza A by PCR NEGATIVE NEGATIVE Final   Influenza B by PCR NEGATIVE NEGATIVE Final    Comment: (NOTE) The Xpert Xpress SARS-CoV-2/FLU/RSV plus assay is intended as an aid in the diagnosis of influenza from Nasopharyngeal swab specimens and should not be used as a sole basis for treatment. Nasal washings and aspirates are unacceptable for Xpert Xpress SARS-CoV-2/FLU/RSV testing.  Fact Sheet for Patients: BloggerCourse.com  Fact Sheet for Healthcare Providers: SeriousBroker.it  This test is not yet approved or cleared by the [place] FDA and has been authorized for detection and/or diagnosis of SARS-CoV-2 by FDA under an Emergency Use Authorization (EUA). This EUA will remain in effect (meaning this test can be used) for the duration of the COVID-19 declaration under Section 564(b)(1) of the Act, 21 U.S.C. section 360bbb-3(b)(1), unless the authorization is terminated or revoked.  Performed at [facility], [address] [place], [place] [address]       Radiology Studies: CT HEAD WO CONTRAST ( )  Result Date: 06/13/2021 CLINICAL DATA:  Left-sided weakness EXAM: CT HEAD  WITHOUT CONTRAST TECHNIQUE: Contiguous axial images were obtained from the base of the skull through the vertex without intravenous contrast. COMPARISON:  06/08/2021 FINDINGS: Brain: There is no mass, hemorrhage or extra-axial collection. The size and configuration of the ventricles and extra-axial CSF spaces are normal. The brain parenchyma is normal, without acute or chronic infarction. Vascular: No abnormal hyperdensity of the major intracranial arteries or dural venous sinuses. No intracranial atherosclerosis. Skull: The visualized skull base,  calvarium and extracranial soft tissues are normal. Sinuses/Orbits: No fluid levels or advanced mucosal thickening of the visualized paranasal sinuses. No mastoid or middle ear effusion. The orbits are normal. IMPRESSION: Normal head CT. Electronically Signed   By: Deatra Robinson M.D.   On: 06/13/2021 19:11   CT HEAD WO CONTRAST  Result Date: 06/08/2021 CLINICAL DATA:  Numbness/Tingling unilateral. Paresthesia involving the lips, face, and scalp EXAM: CT HEAD WITHOUT CONTRAST TECHNIQUE: Contiguous axial images were obtained from the base of the skull through the vertex without intravenous contrast. COMPARISON:  None. FINDINGS: Brain: Normal anatomic configuration. No abnormal intra or extra-axial mass lesion or fluid collection. No abnormal mass effect or midline shift. No evidence of acute intracranial hemorrhage or infarct. Ventricular size is normal. Cerebellum unremarkable. Vascular: Unremarkable Skull: Intact Sinuses/Orbits: There is mucosal thickening within the left sphenoid sinus and bubbly secretions within the posterior right ethmoid air cell. Remaining paranasal sinuses are clear. Orbits are unremarkable. Other: Mastoid air cells and middle ear cavities are clear. IMPRESSION: No acute intracranial abnormality. Mild paranasal sinus disease. Electronically Signed   By: Helyn Numbers M.D.   On: 06/08/2021 00:57   CT Soft Tissue Neck W Contrast  Result Date:  06/08/2021 CLINICAL DATA:  50 year old female with abnormal numbness and tingling involving the lips, face, and scalp. Adenoid hypertrophy and questionable cervical lymphadenopathy on brain MRI earlier today. EXAM: CT NECK WITH CONTRAST TECHNIQUE: Multidetector CT imaging of the neck was performed using the standard protocol following the bolus administration of intravenous contrast. CONTRAST:  80mL OMNIPAQUE IOHEXOL 350 MG/ML SOLN COMPARISON:  Brain MRI 0336 hours. FINDINGS: Pharynx and larynx: Negative larynx. Hypopharynx within normal limits. Palatine tonsils appear to be present and relatively normal, symmetric. There is generalized adenoid hypertrophy (series 2, image 19). No heterogeneous adenoid enhancement. Negative parapharyngeal spaces. Retropharyngeal space is remarkable for both prominent retropharyngeal lymph nodes (8 mm short axis series 2, image 30) and of partially retropharyngeal course of both carotid arteries. Salivary glands: Negative sublingual space. Negative submandibular glands. Parotid glands appear negative aside from mild prominence of subcentimeter bilateral parotid space lymph nodes. Thyroid: Negative. Lymph nodes: Lymph nodes throughout the visible bilateral neck are increased in number, and many nodes are approaching 10 mm short axis. The largest nodes are 12-13 mm short axis, including at the bilateral level 2 and left level 1A nodal stations. No cystic or necrotic nodes. Vascular: Partially retropharyngeal course of both carotids. The major vascular structures in the neck and at the skull base are patent. Limited intracranial: Negative. Visualized orbits: Negative. Mastoids and visualized paranasal sinuses: Mild bubbly opacity and mucosal thickening in the posterior right ethmoid air cell. Similar mucosal thickening in the left sphenoid sinus and right maxillary alveolar recess. Tympanic cavities and visible mastoids are clear. Skeleton: No acute dental finding. Congenital incomplete  ossification of the posterior C1 ring. C5-C6 cervical disc and endplate degeneration. No acute or suspicious osseous lesion. Upper chest: Visible bilateral axillary lymph nodes are small but appear increased in number (series 2, image 88 on the right). No superior mediastinal lymphadenopathy is identified. Lung apices are clear. IMPRESSION: 1. Generalized adenoid hypertrophy and bilateral cervical lymph nodes with diffuse mild increase in size and number. Numerous maximal 10 mm nodes, and the largest bilateral largest nodes are 12-13 mm short axis. No cystic or necrotic nodes, and the remaining pharynx appears normal. This appearance is nonspecific, and can be occasionally physiologic in patients with large body habitus, but is suspicious for a Lymphoproliferative disorder.  2. Minor paranasal sinus inflammation, significance doubtful. Electronically Signed   By: Odessa Fleming M.D.   On: 06/08/2021 05:13   MR BRAIN WO CONTRAST  Result Date: 06/08/2021 CLINICAL DATA:  50 year old female with abnormal numbness and tingling involving the lips, face, and scalp. EXAM: MRI HEAD WITHOUT CONTRAST TECHNIQUE: Multiplanar, multiecho pulse sequences of the brain and surrounding structures were obtained without intravenous contrast. COMPARISON:  Head CT 0042 hours today. FINDINGS: Brain: Normal cerebral volume. No restricted diffusion to suggest acute infarction. No midline shift, mass effect, evidence of mass lesion, ventriculomegaly, extra-axial collection or acute intracranial hemorrhage. Cervicomedullary junction and pituitary are within normal limits. Wallace Cullens and white matter signal is within normal limits for age throughout the brain. No chronic cerebral blood products or encephalomalacia is identified. Vascular: Major intracranial vascular flow voids are preserved. Skull and upper cervical spine: Negative. Sinuses/Orbits: Negative orbits. Mild ethmoid and sphenoid sinus mucosal thickening. No sinus fluid levels. Other: Trace  fluid in the left mastoid air cells. There is symmetric adenoid hypertrophy (series 10, image 1). Coronal images suggest prominent cervical lymph nodes. But scalp and face soft tissues appear symmetric and negative. IMPRESSION: 1. Normal for age noncontrast MRI appearance of the brain. 2. Nonspecific adenoid hypertrophy and suggestion of prominent upper cervical lymph nodes. If there is any palpable lymphadenopathy in the neck then consider a lymphoproliferative disorder, and Neck CT with IV contrast might be valuable. Electronically Signed   By: Odessa Fleming M.D.   On: 06/08/2021 04:28   US Abdomen Limited RUQ (LIVER/GB)  Result Date: 06/11/2021 CLINICAL DATA:  Elevated LFT EXAM: ULTRASOUND ABDOMEN LIMITED RIGHT UPPER QUADRANT COMPARISON:  None. FINDINGS: Gallbladder: No gallstones or wall thickening visualized. No sonographic Murphy sign noted by sonographer. Common bile duct: Diameter: 5.0 mm Liver: Fatty liver with fatty sparing adjacent to the falciform ligament. No mass lesion. Portal vein is patent on color Doppler imaging with normal direction of blood flow towards the liver. Other: None. IMPRESSION: Fatty liver with fatty sparing. Negative for gallstones or biliary dilatation. Electronically Signed   By: Marlan Palau M.D.   On: 06/11/2021 16:47     Scheduled Meds:  vitamin C  500 mg Oral Daily   cyanocobalamin  1,000 mcg Intramuscular Daily   dexamethasone  2 drop Both Eyes Q4H   enoxaparin (LOVENOX) injection  0.5 mg/kg Subcutaneous Q24H   erythromycin   Left Eye Q8H   feeding supplement  237 mL Oral TID BM   iron polysaccharides  150 mg Oral Daily   levothyroxine  88 mcg Oral QAC breakfast   methylPREDNISolone (SOLU-MEDROL) injection  40 mg Intravenous Q12H   PARoxetine  20 mg Oral Daily   [START ON 06/19/2021] vitamin B-12  500 mcg Oral Daily   Vitamin D (Ergocalciferol)  50,000 Units Oral Q7 days   Continuous Infusions:  sodium chloride 50 mL/hr at 06/16/21 1831   acyclovir 705 mg  (06/17/21 1418)     LOS: 6 days   Joycelyn Das, MD [facility] If 7PM-7AM, please contact night-coverage  06/17/2021, 2:35 PM

## 2021-06-18 DIAGNOSIS — D649 Anemia, unspecified: Secondary | ICD-10-CM

## 2021-06-18 DIAGNOSIS — Z21 Asymptomatic human immunodeficiency virus [HIV] infection status: Secondary | ICD-10-CM

## 2021-06-18 LAB — HIV-1 RNA QUANT-NO REFLEX-BLD
HIV 1 RNA Quant: 62100 copies/mL
LOG10 HIV-1 RNA: 4.793 log10copy/mL

## 2021-06-18 LAB — BASIC METABOLIC PANEL
Anion gap: 8 (ref 5–15)
BUN: 18 mg/dL (ref 6–20)
CO2: 22 mmol/L (ref 22–32)
Calcium: 9.2 mg/dL (ref 8.9–10.3)
Chloride: 105 mmol/L (ref 98–111)
Creatinine, Ser: 0.94 mg/dL (ref 0.44–1.00)
GFR, Estimated: >60 mL/min (ref >60)
Glucose, Bld: 102 mg/dL — ABNORMAL HIGH (ref 70–99)
Potassium: 3.8 mmol/L (ref 3.5–5.1)
Sodium: 135 mmol/L (ref 135–145)

## 2021-06-18 LAB — CBC
HCT: 32.4 % — ABNORMAL LOW (ref 36.0–46.0)
Hemoglobin: 9.9 g/dL — ABNORMAL LOW (ref 12.0–15.0)
MCH: 22.9 pg — ABNORMAL LOW (ref 26.0–34.0)
MCHC: 30.6 g/dL (ref 30.0–36.0)
MCV: 74.8 fL — ABNORMAL LOW (ref 80.0–100.0)
Platelets: 273 10*3/uL (ref 150–400)
RBC: 4.33 MIL/uL (ref 3.87–5.11)
RDW: 20.1 % — ABNORMAL HIGH (ref 11.5–15.5)
WBC: 9.4 10*3/uL (ref 4.0–10.5)
nRBC: 0 % (ref 0.0–0.2)

## 2021-06-18 LAB — RPR: RPR Ser Ql: NONREACTIVE

## 2021-06-18 LAB — MAGNESIUM: Magnesium: 1.9 mg/dL (ref 1.7–2.4)

## 2021-06-18 MED ORDER — ENSURE ENLIVE PO LIQD: 237.0000 mL | Freq: Three times a day (TID) | ORAL | 12 refills | Status: DC

## 2021-06-18 MED ORDER — VALACYCLOVIR HCL 1 G PO TABS: 1000.0000 mg | ORAL_TABLET | Freq: Three times a day (TID) | ORAL | 0 refills | Status: AC

## 2021-06-18 MED ORDER — CYANOCOBALAMIN 500 MCG PO TABS: 500.0000 ug | ORAL_TABLET | Freq: Every day | ORAL | 0 refills | Status: AC

## 2021-06-18 MED ORDER — PREDNISONE 10 MG (21) PO TBPK: ORAL_TABLET | ORAL | 0 refills | Status: DC

## 2021-06-18 MED ORDER — VITAMIN D (ERGOCALCIFEROL) 1.25 MG (50000 UNIT) PO CAPS: 50000.0000 [IU] | ORAL_CAPSULE | ORAL | 0 refills | Status: DC

## 2021-06-18 MED ORDER — ASCORBIC ACID 500 MG PO TABS: 500.0000 mg | ORAL_TABLET | Freq: Every day | ORAL | 0 refills | Status: DC

## 2021-06-18 MED ORDER — OXYCODONE HCL 5 MG PO TABS: 5.0000 mg | ORAL_TABLET | Freq: Four times a day (QID) | ORAL | 0 refills | Status: DC | PRN

## 2021-06-18 MED ORDER — POLYSACCHARIDE IRON COMPLEX 150 MG PO CAPS: 150.0000 mg | ORAL_CAPSULE | Freq: Every day | ORAL | 0 refills | Status: DC

## 2021-06-18 MED ORDER — DOCUSATE SODIUM 50 MG/5ML PO LIQD: 100.0000 mg | Freq: Two times a day (BID) | ORAL | 0 refills | Status: DC | PRN

## 2021-06-18 MED ORDER — ERYTHROMYCIN 5 MG/GM OP OINT: TOPICAL_OINTMENT | Freq: Three times a day (TID) | OPHTHALMIC | 0 refills | Status: DC

## 2021-06-18 MED ORDER — DEXAMETHASONE 0.1 % OP SUSP: 2.0000 [drp] | OPHTHALMIC | 0 refills | Status: AC

## 2021-06-18 MED ORDER — PHENOL 1.4 % MT LIQD
1.0000 | OROMUCOSAL | 0 refills | Status: DC | PRN
Start: 2021-06-18 — End: 2023-12-27

## 2021-06-18 NOTE — Discharge Summary (Signed)
Physician Discharge Summary  Megan [name] [name] DOB: August 21, 1971 DOA: 06/11/2021  PCP: [facility]  Admit date: 06/11/2021 Discharge date: 06/18/2021  Admitted From: Home Disposition:  Home  Recommendations for Outpatient Follow-up:  Follow up with PCP in 1-2 weeks Follow-up with ophthalmology Dr. Brooke Dare within 1 to 3 days Follow-up with infectious diseases within 1 to 2 weeks Please obtain CMP/CBC, Mag, Phos in one week Please follow up on the following pending results: HIV genotyping  Home Health: No Equipment/Devices:  None   Discharge Condition: Stable  CODE STATUS: FULL CODE  Diet recommendation: Regular Diet   Brief/Interim Summary: The patient is [name] with past medical history significant for but not limited to hypothyroidism, asthma, obesity as well as other comorbidities who presented to the hospital left-sided facial and scalp swelling as well as pain and rash associated with tingling sensation.  Initially she came to the ED when a CT scan and MRI brain were done and she got home but subsequently started developing blisters with sharp pain so she presented to her PCP who diagnosed with a herpes zoster and she was started on valacyclovir and steroids.  She is not able to swallow pills at home so she came to the hospital for further evaluation.  In the ED she had fluorescein dye that showed several lesions and ophthalmology was contacted and she was admitted to the hospital for further evaluation.  She was admitted for trigeminal herpes zoster and herpes zoster ophthalmicus and maxillary involvement and ophthalmology recommended IV acyclovir for 7 days and transitioning to p.o. Valtrex 2 times a day for 3 days upon discharge as well as continuing steroid eyedrops as well.  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  ID was consulted and recommended outpatient follow-up  for further discussion and treatment plan.  Her CD4 count was 442 and her genotyping has been sent including STD screen.  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   -Confirmatory test is positive as well.  ID has been consulted.   -ID has recommended outpatient follow-up for further discussion and treatment plan.  Patient is still in denial about the diagnosis.  Spoke with Dr.Fitzerald ID.  He stated that patient will have an appointment scheduled as outpatient.   -Genotyping has been sent including STD screen.  CD4 absolute count of 442.   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  Ophthalmology on board.  Dr. Tyson Babinski communicated with ophthalmology on-call Dr Caprice Red at [facility] who recommended continuation of Valtrex 1000 mg p.o. 3 times daily for 3 days on discharge until evaluation in the clinic and continue steroid drops as well..   -Patient could walk into the clinic for an appointment at [facility] eye care after discharge or they could see her in the hospital prior to discharge.  On IV acyclovir at this time which is now complete.  Was unable to tolerate large pills but is improved.  On acyclovir 3 times daily for total of 7 days.  Completed day  7/ 7.   -She was on IV Solu-Medrol 40 mg every 12 and then  transition to p.o. Sterapred taper given her facial swelling which is now improved -Patient will need to come continue Valtrex for 3 more days on discharge.     dysphagia Improved at this time.   Elevated LFTs likely fatty liver disease. -Hepatitis panel was negative.  Right upper quadrant ultrasound with fatty liver.  Negative  for gallstones.  We will continue to monitor as outpatient given that LFTs are mildly elevated and trending down now with a AST showing 56 and LFTs showing 71 on last check   Asthma/Depression No acute issues at this time  And will continue paroxetine ophthalmology  Hypothyroidism -Continue with thyroid supplementation   Iron deficiency anemia and chronic microcytic anemia -Anemia panel done and showed an iron level of 30, U IBC 470, TIBC 500, saturation ratios of 6%, folate level 11.2, vitamin B12 of 260 -Continue oral iron supplement.  Repeat iron profile in 3 to 6 months. -Patient's hemoglobin/hematocrit is stable at 9.9/32.4 and MCV 74.8 -No signs or symptoms of bleeding; repeat CBC in outpatient setting   Vitamin B12 level 260, target >400, started vitamin B12 1000 mcg IM injection during hospital stay and oral supplement on discharge with 500 mcg p.o. daily.   Vitamin D deficiency, started vitamin D 50,000 units p.o. weekly given her vitamin D level of 20.13 and given enough doses for 5 weeks and will have PCP follow-up.    Discharge Instructions  Discharge Instructions     Call MD for:  difficulty breathing, headache or visual disturbances   Complete by: As directed    Call MD for:  extreme fatigue   Complete by: As directed    Call MD for:  hives   Complete by: As directed    Call MD for:  persistant dizziness or light-headedness   Complete by: As directed    Call MD for:  persistant nausea and vomiting   Complete by: As directed    Call MD for:  redness, tenderness, or signs of infection (pain, swelling, redness, odor or green/yellow discharge around incision site)   Complete by: As directed    Call MD for:  severe uncontrolled pain   Complete by: As directed    Call MD for:  temperature >100.4   Complete by: As directed    Diet - low sodium heart healthy   Complete by: As directed    Discharge instructions   Complete by: As directed    You were cared for by a  hospitalist during your hospital stay. If you have any questions about your discharge medications or the care you received while you were in the hospital after you are discharged, you can call the unit and ask to speak with the hospitalist on call if the hospitalist that took care of you is not available. Once you are discharged, your primary care physician will handle any further medical issues. Please note that NO REFILLS for any discharge medications will be authorized once you are discharged, as it is imperative that you return to your primary care physician (or establish a relationship with a primary care physician if you do not have one) for your aftercare needs so that they can reassess your need for medications and monitor your lab values.  Follow up with PCP, ID, and Opthalmology. Take all medications as prescribed. If symptoms change or worsen please return to the ED for evaluation   Increase activity slowly   Complete by: As directed       Allergies as of 06/18/2021  No Known Allergies      Medication List     STOP taking these medications    predniSONE 10 MG tablet Commonly known as: DELTASONE Replaced by: predniSONE 10 MG (21) Tbpk tablet   traMADol 50 MG tablet Commonly known as: Ultram       TAKE these medications    ascorbic acid 500 MG tablet Commonly known as: VITAMIN C Take 1 tablet (500 mg total) by mouth daily. Start taking on: June 19, 2021   dexamethasone 0.1 % ophthalmic suspension Commonly known as: DECADRON Place 2 drops into both eyes every 4 (four) hours.   docusate 50 MG/5ML liquid Commonly known as: COLACE Take 10 mLs (100 mg total) by mouth 2 (two) times daily as needed for mild constipation.   erythromycin ophthalmic ointment Place into the left eye every 8 (eight) hours.   feeding supplement Liqd Take 237 mLs by mouth 3 (three) times daily between meals.   iron polysaccharides 150 MG capsule Commonly known as: NIFEREX Take 1  capsule (150 mg total) by mouth daily. Start taking on: June 19, 2021   levothyroxine 88 MCG tablet Commonly known as: SYNTHROID Take 88 mcg by mouth daily before breakfast.   loratadine 10 MG tablet Commonly known as: CLARITIN Take 10 mg by mouth daily as needed for allergies.   oxyCODONE 5 MG immediate release tablet Commonly known as: Oxy IR/ROXICODONE Take 1 tablet (5 mg total) by mouth every 6 (six) hours as needed for moderate pain.   PARoxetine 20 MG tablet Commonly known as: PAXIL Take 20 mg by mouth daily.   phenol 1.4 % Liqd Commonly known as: CHLORASEPTIC Use as directed 1 spray in the mouth or throat as needed for throat irritation / pain.   predniSONE 10 MG (21) Tbpk tablet Commonly known as: STERAPRED UNI-PAK 21 TAB Take 6 tablets on day 1, 5 tablets on day 2, 4 tablets on day 3, 3 tablets on day 4, 2 tablets on day 5, 1 tablet on day 6 and then stop on day 7 Replaces: predniSONE 10 MG tablet   ProAir Digihaler 108 (90 Base) MCG/ACT Aepb Generic drug: Albuterol Sulfate (sensor) Inhale 2 puffs into the lungs every 4 (four) hours.   traZODone 50 MG tablet Commonly known as: DESYREL Take 50 mg by mouth at bedtime as needed for sleep.   valACYclovir 1000 MG tablet Commonly known as: VALTREX Take 1 tablet (1,000 mg total) by mouth 3 (three) times daily for 3 days.   vitamin B-12 500 MCG tablet Commonly known as: CYANOCOBALAMIN Take 1 tablet (500 mcg total) by mouth daily. Start taking on: June 19, 2021   Vitamin D (Ergocalciferol) 1.25 MG (50000 UNIT) Caps capsule Commonly known as: DRISDOL Take 1 capsule (50,000 Units total) by mouth every 7 (seven) days. Start taking on: June 20, 2021        No Known Allergies  Consultations: Ophthalmology Infectious diseases  Procedures/Studies: CT HEAD WO CONTRAST ( )  Result Date: 06/13/2021 CLINICAL DATA:  Left-sided weakness EXAM: CT HEAD WITHOUT CONTRAST TECHNIQUE: Contiguous axial images  were obtained from the base of the skull through the vertex without intravenous contrast. COMPARISON:  06/08/2021 FINDINGS: Brain: There is no mass, hemorrhage or extra-axial collection. The size and configuration of the ventricles and extra-axial CSF spaces are normal. The brain parenchyma is normal, without acute or chronic infarction. Vascular: No abnormal hyperdensity of the major intracranial arteries or dural venous sinuses. No intracranial atherosclerosis. Skull: The visualized skull base, calvarium and  extracranial soft tissues are normal. Sinuses/Orbits: No fluid levels or advanced mucosal thickening of the visualized paranasal sinuses. No mastoid or middle ear effusion. The orbits are normal. IMPRESSION: Normal head CT. Electronically Signed   By: Deatra Robinson M.D.   On: 06/13/2021 19:11   CT HEAD WO CONTRAST  Result Date: 06/08/2021 CLINICAL DATA:  Numbness/Tingling unilateral. Paresthesia involving the lips, face, and scalp EXAM: CT HEAD WITHOUT CONTRAST TECHNIQUE: Contiguous axial images were obtained from the base of the skull through the vertex without intravenous contrast. COMPARISON:  None. FINDINGS: Brain: Normal anatomic configuration. No abnormal intra or extra-axial mass lesion or fluid collection. No abnormal mass effect or midline shift. No evidence of acute intracranial hemorrhage or infarct. Ventricular size is normal. Cerebellum unremarkable. Vascular: Unremarkable Skull: Intact Sinuses/Orbits: There is mucosal thickening within the left sphenoid sinus and bubbly secretions within the posterior right ethmoid air cell. Remaining paranasal sinuses are clear. Orbits are unremarkable. Other: Mastoid air cells and middle ear cavities are clear. IMPRESSION: No acute intracranial abnormality. Mild paranasal sinus disease. Electronically Signed   By: Helyn Numbers M.D.   On: 06/08/2021 00:57   CT Soft Tissue Neck W Contrast  Result Date: 06/08/2021 CLINICAL DATA:  50 year old female with  abnormal numbness and tingling involving the lips, face, and scalp. Adenoid hypertrophy and questionable cervical lymphadenopathy on brain MRI earlier today. EXAM: CT NECK WITH CONTRAST TECHNIQUE: Multidetector CT imaging of the neck was performed using the standard protocol following the bolus administration of intravenous contrast. CONTRAST:  60mL OMNIPAQUE IOHEXOL 350 MG/ML SOLN COMPARISON:  Brain MRI 0336 hours. FINDINGS: Pharynx and larynx: Negative larynx. Hypopharynx within normal limits. Palatine tonsils appear to be present and relatively normal, symmetric. There is generalized adenoid hypertrophy (series 2, image 19). No heterogeneous adenoid enhancement. Negative parapharyngeal spaces. Retropharyngeal space is remarkable for both prominent retropharyngeal lymph nodes (8 mm short axis series 2, image 30) and of partially retropharyngeal course of both carotid arteries. Salivary glands: Negative sublingual space. Negative submandibular glands. Parotid glands appear negative aside from mild prominence of subcentimeter bilateral parotid space lymph nodes. Thyroid: Negative. Lymph nodes: Lymph nodes throughout the visible bilateral neck are increased in number, and many nodes are approaching 10 mm short axis. The largest nodes are 12-13 mm short axis, including at the bilateral level 2 and left level 1A nodal stations. No cystic or necrotic nodes. Vascular: Partially retropharyngeal course of both carotids. The major vascular structures in the neck and at the skull base are patent. Limited intracranial: Negative. Visualized orbits: Negative. Mastoids and visualized paranasal sinuses: Mild bubbly opacity and mucosal thickening in the posterior right ethmoid air cell. Similar mucosal thickening in the left sphenoid sinus and right maxillary alveolar recess. Tympanic cavities and visible mastoids are clear. Skeleton: No acute dental finding. Congenital incomplete ossification of the posterior C1 ring. C5-C6  cervical disc and endplate degeneration. No acute or suspicious osseous lesion. Upper chest: Visible bilateral axillary lymph nodes are small but appear increased in number (series 2, image 88 on the right). No superior mediastinal lymphadenopathy is identified. Lung apices are clear. IMPRESSION: 1. Generalized adenoid hypertrophy and bilateral cervical lymph nodes with diffuse mild increase in size and number. Numerous maximal 10 mm nodes, and the largest bilateral largest nodes are 12-13 mm short axis. No cystic or necrotic nodes, and the remaining pharynx appears normal. This appearance is nonspecific, and can be occasionally physiologic in patients with large body habitus, but is suspicious for a Lymphoproliferative disorder. 2. Minor  paranasal sinus inflammation, significance doubtful. Electronically Signed   By: Odessa Fleming M.D.   On: 06/08/2021 05:13   MR BRAIN WO CONTRAST  Result Date: 06/08/2021 CLINICAL DATA:  50 year old female with abnormal numbness and tingling involving the lips, face, and scalp. EXAM: MRI HEAD WITHOUT CONTRAST TECHNIQUE: Multiplanar, multiecho pulse sequences of the brain and surrounding structures were obtained without intravenous contrast. COMPARISON:  Head CT 0042 hours today. FINDINGS: Brain: Normal cerebral volume. No restricted diffusion to suggest acute infarction. No midline shift, mass effect, evidence of mass lesion, ventriculomegaly, extra-axial collection or acute intracranial hemorrhage. Cervicomedullary junction and pituitary are within normal limits. Wallace Cullens and white matter signal is within normal limits for age throughout the brain. No chronic cerebral blood products or encephalomalacia is identified. Vascular: Major intracranial vascular flow voids are preserved. Skull and upper cervical spine: Negative. Sinuses/Orbits: Negative orbits. Mild ethmoid and sphenoid sinus mucosal thickening. No sinus fluid levels. Other: Trace fluid in the left mastoid air cells. There is  symmetric adenoid hypertrophy (series 10, image 1). Coronal images suggest prominent cervical lymph nodes. But scalp and face soft tissues appear symmetric and negative. IMPRESSION: 1. Normal for age noncontrast MRI appearance of the brain. 2. Nonspecific adenoid hypertrophy and suggestion of prominent upper cervical lymph nodes. If there is any palpable lymphadenopathy in the neck then consider a lymphoproliferative disorder, and Neck CT with IV contrast might be valuable. Electronically Signed   By: Odessa Fleming M.D.   On: 06/08/2021 04:28   US Abdomen Limited RUQ (LIVER/GB)  Result Date: 06/11/2021 CLINICAL DATA:  Elevated LFT EXAM: ULTRASOUND ABDOMEN LIMITED RIGHT UPPER QUADRANT COMPARISON:  None. FINDINGS: Gallbladder: No gallstones or wall thickening visualized. No sonographic Murphy sign noted by sonographer. Common bile duct: Diameter: 5.0 mm Liver: Fatty liver with fatty sparing adjacent to the falciform ligament. No mass lesion. Portal vein is patent on color Doppler imaging with normal direction of blood flow towards the liver. Other: None. IMPRESSION: Fatty liver with fatty sparing. Negative for gallstones or biliary dilatation. Electronically Signed   By: Marlan Palau M.D.   On: 06/11/2021 16:47     Subjective: Seen and examined at bedside and her facial swelling improved.  Denied any chest pain or shortness of breath.  Able to swallow her pills now.  Labs are relatively stable and will complete her IV acyclovir and be transition to p.o. Valtrex for discharge and understands that she needs to follow-up with PCP, ID as well as ophthalmology in outpatient setting.  Discharge Exam: Vitals:   06/18/21 0845 06/18/21 1240  BP: (!) 152/99 (!) 155/104  Pulse: 73 87  Resp: 16 16  Temp: 97.8 F (36.6 C) 98.7 F (37.1 C)  SpO2: 100% 97%   Vitals:   06/17/21 2024 06/18/21 0700 06/18/21 0845 06/18/21 1240  BP: 132/87 (!) 152/103 (!) 152/99 (!) 155/104  Pulse: 78 84 73 87  Resp: Temp: 98.1 F (36.7 C) 98.2 F (36.8 C) 97.8 F (36.6 C) 98.7 F (37.1 C)  TempSrc: Oral Oral Oral Axillary  SpO2: 98% 99% 100% 97%  Weight:      Height:       General: Pt is alert, awake, not in acute distress Cardiovascular: RRR, S1/S2 +, no rubs, no gallops Respiratory: Slight diminished bilaterally, no wheezing, no rhonchi Abdominal: Soft, NT, distended secondary body habitus, bowel sounds + Extremities: no edema, no cyanosis  The results of significant diagnostics from this hospitalization (including imaging, microbiology, ancillary and  laboratory) are listed below for reference.    Microbiology: Recent Results (from the past 240 hour(s))  Resp Panel by RT-PCR (Flu A&B, Covid) Nasopharyngeal Swab     Status: None   Collection Time: 06/11/21 12:45 PM   Specimen: Nasopharyngeal Swab; Nasopharyngeal(NP) swabs in vial transport medium  Result Value Ref Range Status   SARS Coronavirus 2 by RT PCR NEGATIVE NEGATIVE Final    Comment: (NOTE) SARS-CoV-2 target nucleic acids are NOT DETECTED.  The SARS-CoV-2 RNA is generally detectable in upper respiratory specimens during the acute phase of infection. The lowest concentration of SARS-CoV-2 viral copies this assay can detect is 138 copies/mL. A negative result does not preclude SARS-Cov-2 infection and should not be used as the sole basis for treatment or other patient management decisions. A negative result may occur with  improper specimen collection/handling, submission of specimen other than nasopharyngeal swab, presence of viral mutation(s) within the areas targeted by this assay, and inadequate number of viral copies(<138 copies/mL). A negative result must be combined with clinical observations, patient history, and epidemiological information. The expected result is Negative.  Fact Sheet for Patients:  BloggerCourse.com  Fact Sheet for Healthcare Providers:   SeriousBroker.it  This test is no t yet approved or cleared by the [place] FDA and  has been authorized for detection and/or diagnosis of SARS-CoV-2 by FDA under an Emergency Use Authorization (EUA). This EUA will remain  in effect (meaning this test can be used) for the duration of the COVID-19 declaration under Section 564(b)(1) of the Act, 21 U.S.C.section 360bbb-3(b)(1), unless the authorization is terminated  or revoked sooner.       Influenza A by PCR NEGATIVE NEGATIVE Final   Influenza B by PCR NEGATIVE NEGATIVE Final    Comment: (NOTE) The Xpert Xpress SARS-CoV-2/FLU/RSV plus assay is intended as an aid in the diagnosis of influenza from Nasopharyngeal swab specimens and should not be used as a sole basis for treatment. Nasal washings and aspirates are unacceptable for Xpert Xpress SARS-CoV-2/FLU/RSV testing.  Fact Sheet for Patients: BloggerCourse.com  Fact Sheet for Healthcare Providers: SeriousBroker.it  This test is not yet approved or cleared by the [place] FDA and has been authorized for detection and/or diagnosis of SARS-CoV-2 by FDA under an Emergency Use Authorization (EUA). This EUA will remain in effect (meaning this test can be used) for the duration of the COVID-19 declaration under Section 564(b)(1) of the Act, 21 U.S.C. section 360bbb-3(b)(1), unless the authorization is terminated or revoked.  Performed at [facility], [address] [place], [place] [address]     Labs: BNP (last 3 results) No results for input(s): BNP in the last 8760 hours. Basic Metabolic Panel: Recent Labs  Lab 06/13/21 0519 06/14/21 0450 06/15/21 0449 06/16/21 0423 06/17/21 0434 06/18/21 0456  NA 136 138 136 136 135 135  K 3.8 3.7 3.6 3.6 4.2 3.8  CL 107 106 107 106 105 105  CO2 25 25 24 24 25 22   GLUCOSE 94 88 91 99 136* 102*  BUN 15 15 12 17 16 18   CREATININE 0.88  0.81 0.77 0.95 0.79 0.94  CALCIUM 8.7* 8.7* 8.7* 9.0 9.1 9.2  MG 2.0 1.9 1.9  --  1.8 1.9  PHOS 3.1 3.1 3.0  --   --   --    Liver Function Tests: Recent Labs  Lab 06/12/21 0657 06/14/21 0450 06/15/21 0449 06/16/21 0423 06/17/21 0434  AST 82* 73* 95* 67* 56*  ALT 70* 66* 92* 81* 71*  ALKPHOS 50 42 41 42 42  BILITOT 1.1 0.7 1.1 0.8 1.0  PROT 8.4* 7.6 8.1 7.5 7.9  ALBUMIN 3.6 3.0* 3.2* 3.3* 3.4*   No results for input(s): LIPASE, AMYLASE in the last 168 hours. No results for input(s): AMMONIA in the last 168 hours. CBC: Recent Labs  Lab 06/15/21 0449 06/15/21 1459 06/16/21 0423 06/17/21 0434 06/18/21 0456  WBC 7.4 7.7 8.2 8.8 9.4  NEUTROABS  --  4.5 4.1  --   --   HGB 9.2* 9.6* 9.6* 9.5* 9.9*  HCT 29.2* 32.3* 30.7* 29.9* 32.4*  MCV 72.8* 74* 72.2* 73.6* 74.8*  PLT 242 261 271 279 273   Cardiac Enzymes: No results for input(s): CKTOTAL, CKMB, CKMBINDEX, TROPONINI in the last 168 hours. BNP: Invalid input(s): POCBNP CBG: No results for input(s): GLUCAP in the last 168 hours. D-Dimer No results for input(s): DDIMER in the last 72 hours. Hgb A1c No results for input(s): HGBA1C in the last 72 hours. Lipid Profile No results for input(s): CHOL, HDL, LDLCALC, TRIG, CHOLHDL, LDLDIRECT in the last 72 hours. Thyroid function studies No results for input(s): TSH, T4TOTAL, T3FREE, THYROIDAB in the last 72 hours.  Invalid input(s): FREET3 Anemia work up No results for input(s): VITAMINB12, FOLATE, FERRITIN, TIBC, IRON, RETICCTPCT in the last 72 hours. Urinalysis    Component Value Date/Time   COLORURINE YELLOW (A) 09/29/2015 1534   APPEARANCEUR CLEAR (A) 09/29/2015 1534   APPEARANCEUR Hazy 09/07/2013 1644   LABSPEC 1.021 09/29/2015 1534   LABSPEC 1.020 09/07/2013 1644   PHURINE 5.0 09/29/2015 1534   GLUCOSEU NEGATIVE 09/29/2015 1534   GLUCOSEU Negative 09/07/2013 1644   HGBUR 3+ (A) 09/29/2015 1534   BILIRUBINUR NEGATIVE 09/29/2015 1534   BILIRUBINUR Negative  09/07/2013 1644   KETONESUR NEGATIVE 09/29/2015 1534   PROTEINUR NEGATIVE 09/29/2015 1534   NITRITE NEGATIVE 09/29/2015 1534   LEUKOCYTESUR NEGATIVE 09/29/2015 1534   LEUKOCYTESUR Negative 09/07/2013 1644   Sepsis Labs Invalid input(s): PROCALCITONIN,  WBC,  LACTICIDVEN Microbiology Recent Results (from the past 240 hour(s))  Resp Panel by RT-PCR (Flu A&B, Covid) Nasopharyngeal Swab     Status: None   Collection Time: 06/11/21 12:45 PM   Specimen: Nasopharyngeal Swab; Nasopharyngeal(NP) swabs in vial transport medium  Result Value Ref Range Status   SARS Coronavirus 2 by RT PCR NEGATIVE NEGATIVE Final    Comment: (NOTE) SARS-CoV-2 target nucleic acids are NOT DETECTED.  The SARS-CoV-2 RNA is generally detectable in upper respiratory specimens during the acute phase of infection. The lowest concentration of SARS-CoV-2 viral copies this assay can detect is 138 copies/mL. A negative result does not preclude SARS-Cov-2 infection and should not be used as the sole basis for treatment or other patient management decisions. A negative result may occur with  improper specimen collection/handling, submission of specimen other than nasopharyngeal swab, presence of viral mutation(s) within the areas targeted by this assay, and inadequate number of viral copies(<138 copies/mL). A negative result must be combined with clinical observations, patient history, and epidemiological information. The expected result is Negative.  Fact Sheet for Patients:  BloggerCourse.com  Fact Sheet for Healthcare Providers:  SeriousBroker.it  This test is no t yet approved or cleared by the [place] FDA and  has been authorized for detection and/or diagnosis of SARS-CoV-2 by FDA under an Emergency Use Authorization (EUA). This EUA will remain  in effect (meaning this test can be used) for the duration of the COVID-19 declaration under Section 564(b)(1) of  the Act, 21 U.S.C.section 360bbb-3(b)(1),  unless the authorization is terminated  or revoked sooner.       Influenza A by PCR NEGATIVE NEGATIVE Final   Influenza B by PCR NEGATIVE NEGATIVE Final    Comment: (NOTE) The Xpert Xpress SARS-CoV-2/FLU/RSV plus assay is intended as an aid in the diagnosis of influenza from Nasopharyngeal swab specimens and should not be used as a sole basis for treatment. Nasal washings and aspirates are unacceptable for Xpert Xpress SARS-CoV-2/FLU/RSV testing.  Fact Sheet for Patients: BloggerCourse.com  Fact Sheet for Healthcare Providers: SeriousBroker.it  This test is not yet approved or cleared by the [place] FDA and has been authorized for detection and/or diagnosis of SARS-CoV-2 by FDA under an Emergency Use Authorization (EUA). This EUA will remain in effect (meaning this test can be used) for the duration of the COVID-19 declaration under Section 564(b)(1) of the Act, 21 U.S.C. section 360bbb-3(b)(1), unless the authorization is terminated or revoked.  Performed at [facility], [address], [place], [place] [address]    Time coordinating discharge: 35 minutes  SIGNED:  Merlene Laughter, DO [facility] 06/18/2021, 2:29 PM Pager is on AMION  If 7PM-7AM, please contact night-coverage [email or web address]

## 2021-06-19 LAB — GLUCOSE 6 PHOSPHATE DEHYDROGENASE
G6PDH: 3.1 U/g{Hb} — ABNORMAL LOW (ref 4.7–14.6)
Hemoglobin: 9.8 g/dL — ABNORMAL LOW (ref 11.1–15.9)

## 2021-06-23 LAB — HLA B*5701: HLA B 5701: NEGATIVE

## 2021-06-26 LAB — GENOSURE PRIME (GSPRIL)

## 2021-12-04 IMAGING — MG MM DIGITAL SCREENING BILAT W/ TOMO AND CAD
8 series · 8 of 24 positions shown · non-contrast
Comparison: Previous exam(s).

CLINICAL DATA: Screening.

EXAM:
DIGITAL SCREENING BILATERAL MAMMOGRAM WITH TOMOSYNTHESIS AND CAD
TECHNIQUE: Bilateral screening digital craniocaudal and mediolateral oblique
mammograms were obtained. Bilateral screening digital breast
tomosynthesis was performed. The images were evaluated with
computer-aided detection.

[L MLO synth-2D]
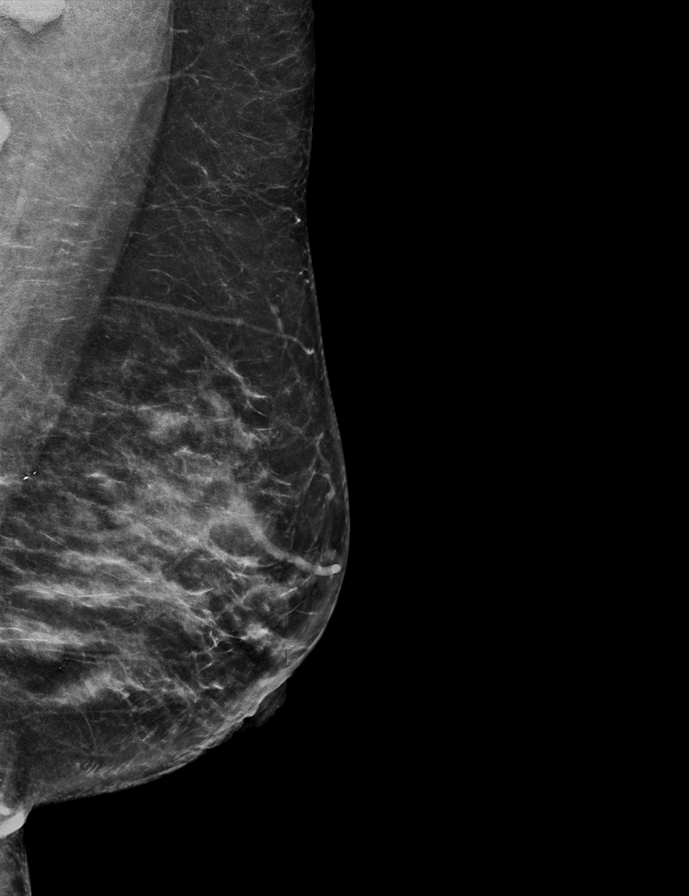

[R CC synth-2D]
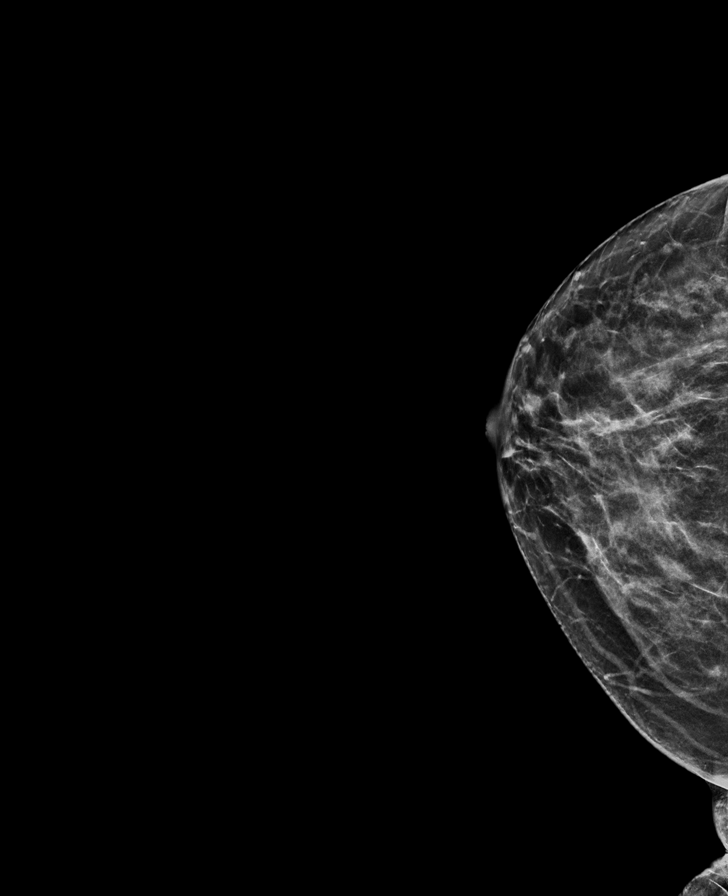

[R MLO synth-2D]
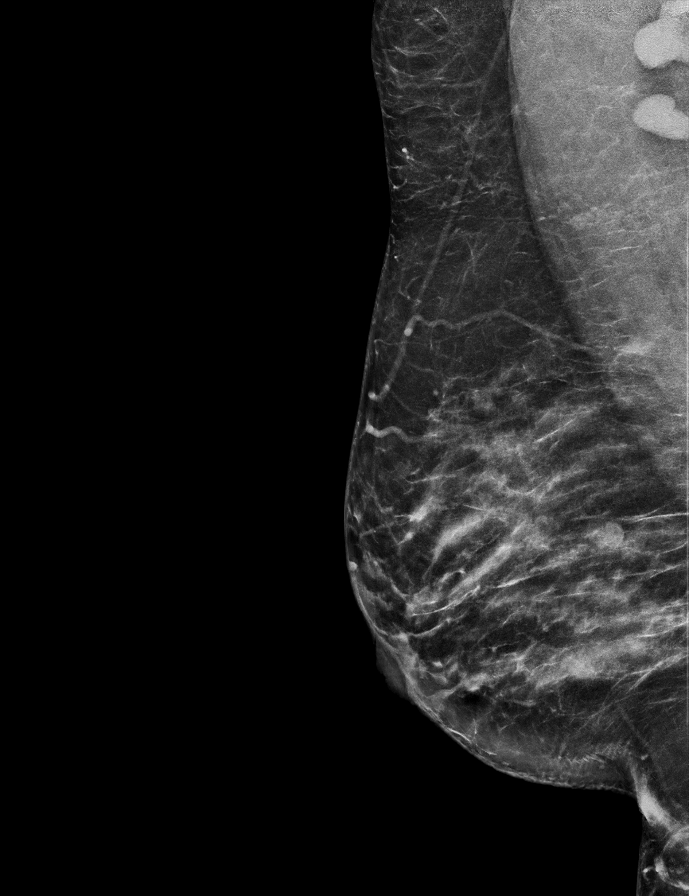

[L CC synth-2D]
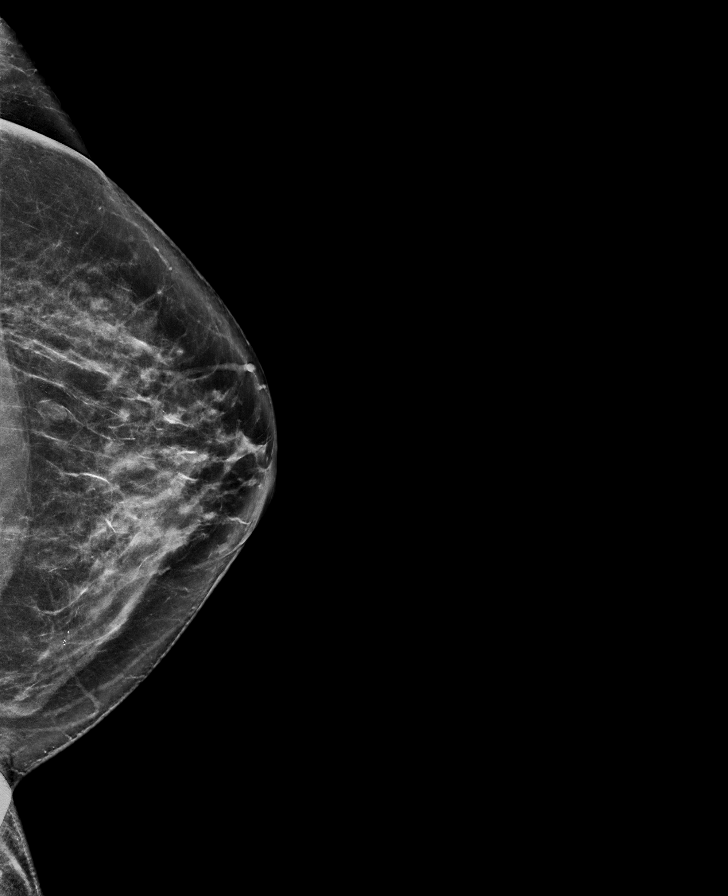

[R MLO tomo · tomo slice 32/63.0]
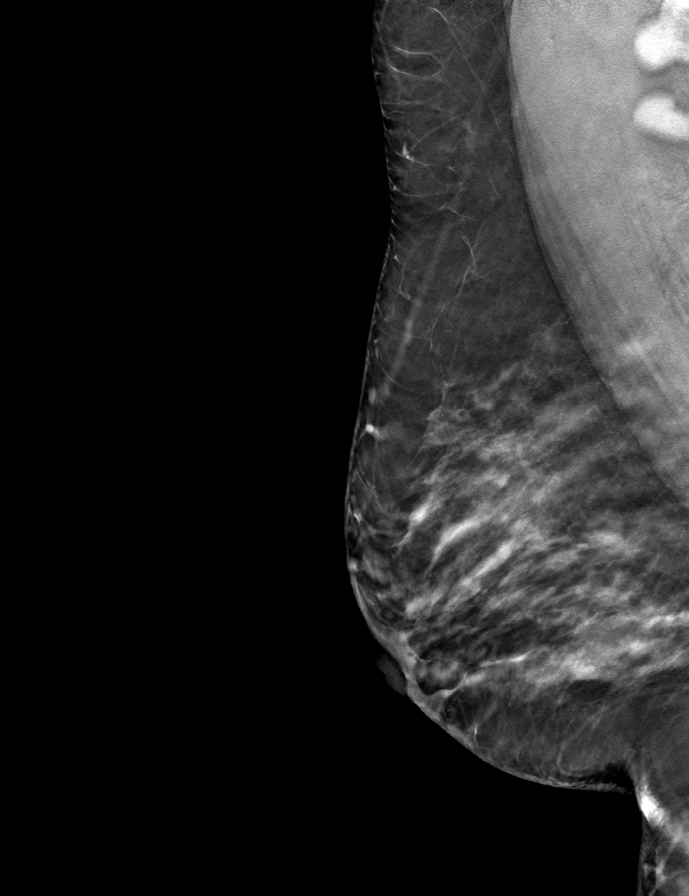

[R CC tomo · tomo slice 28/55.0]
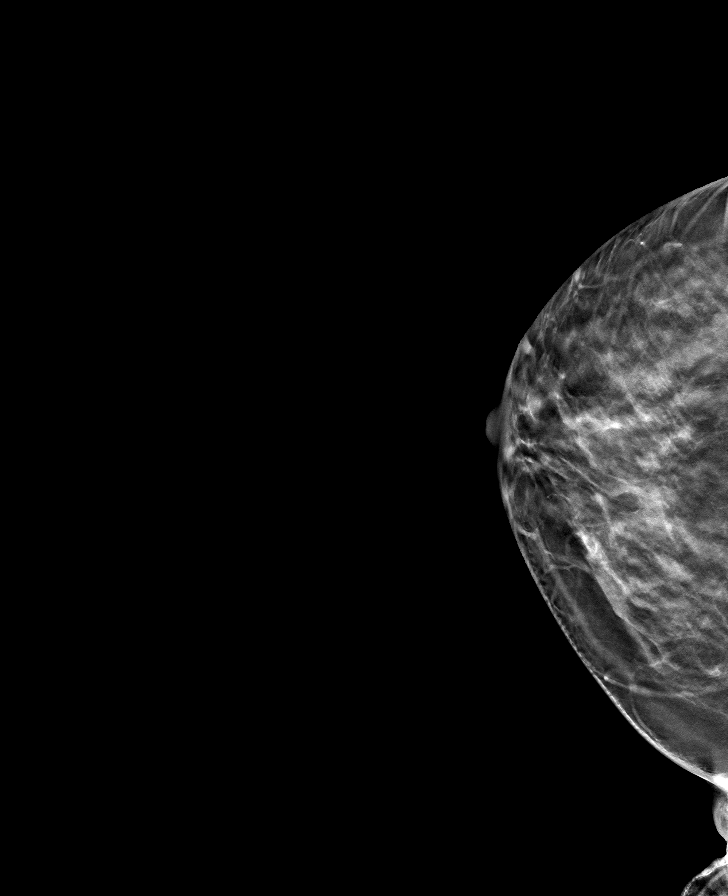

[L CC tomo · tomo slice 35/70.0]
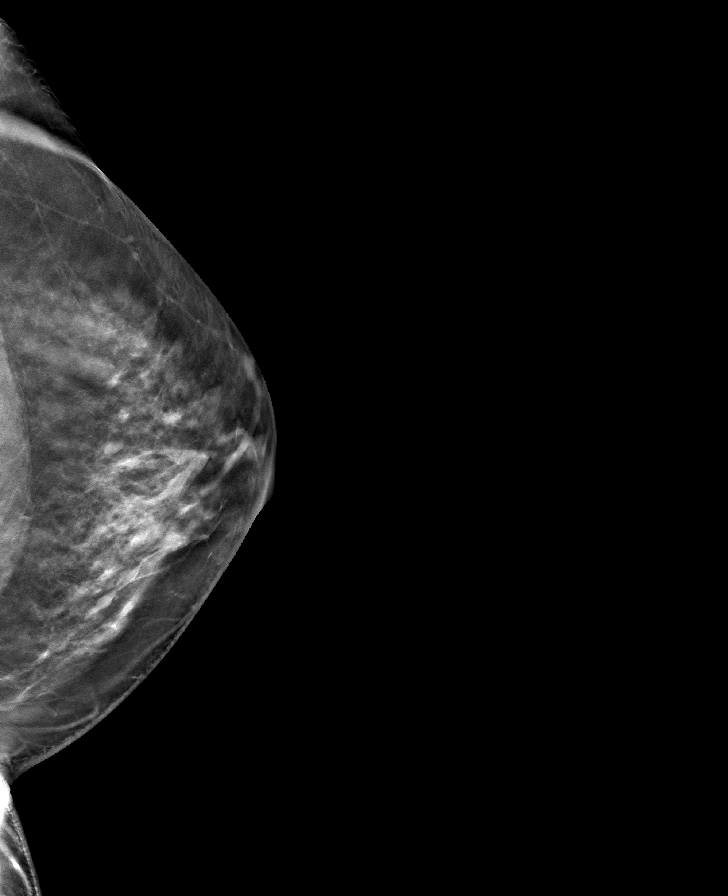

[L MLO tomo · tomo slice 33/66.0]
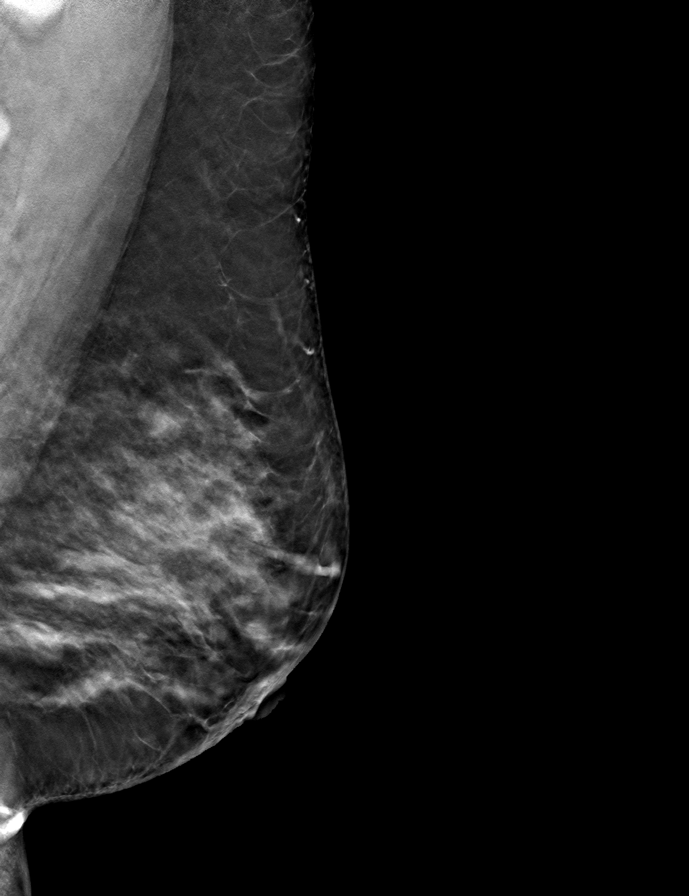

[8 of 24 positions shown; findings below may reference images not displayed]

ACR Breast Density Category c: The breast tissue is heterogeneously
dense, which may obscure small masses.
FINDINGS: In the right breast a possible mass requires further evaluation.

In the left breast a possible mass requires further evaluation.

The images were evaluated with computer-aided detection.
IMPRESSION: Further evaluation is suggested for possible mass in the right
breast.

Further evaluation is suggested for possible mass in the left
breast.

RECOMMENDATION:
Diagnostic mammogram and possibly ultrasound of both breasts.
(Code:[id number])

The patient will be contacted regarding the findings, and additional
imaging will be scheduled.

BI-RADS CATEGORY  0: Incomplete. Need additional imaging evaluation
and/or prior mammograms for comparison.

## 2021-12-11 ENCOUNTER — Other Ambulatory Visit: Payer: Self-pay

## 2021-12-11 DIAGNOSIS — Z1211 Encounter for screening for malignant neoplasm of colon: Secondary | ICD-10-CM

## 2021-12-11 DIAGNOSIS — N6321 Unspecified lump in the left breast, upper outer quadrant: Secondary | ICD-10-CM

## 2021-12-17 ENCOUNTER — Other Ambulatory Visit: Payer: Self-pay | Admitting: *Deleted

## 2021-12-17 ENCOUNTER — Ambulatory Visit: Payer: Self-pay | Attending: Hematology and Oncology | Admitting: *Deleted

## 2021-12-17 VITALS — BP 121/85 | HR 91 | Resp 18 | Wt 232.0 lb

## 2021-12-17 DIAGNOSIS — Z1239 Encounter for other screening for malignant neoplasm of breast: Secondary | ICD-10-CM

## 2021-12-17 DIAGNOSIS — N644 Mastodynia: Secondary | ICD-10-CM

## 2021-12-17 DIAGNOSIS — N6315 Unspecified lump in the right breast, overlapping quadrants: Secondary | ICD-10-CM

## 2021-12-17 NOTE — Patient Instructions (Signed)
Explained breast self awareness with Megan [name]. Patient did not need a Pap smear today due to last Pap smear and HPV typing was 10/09/2020. Let her know [facility] will cover Pap smears and HPV typing every 5 years unless has a history of abnormal Pap smears. Referred patient to the [facility] for a diagnostic mammogram. Appointment scheduled Tuesday, December 23, 2021 at 1020. Patient aware of appointment and will be there. Megan [name] verbalized understanding. ? ?[name], [name], RN ?3:07 PM ? ? ? ? ?

## 2021-12-17 NOTE — Progress Notes (Signed)
Megan [name] is a 51 y.o. female who presents to [facility] clinic today with complaint of diffuse left breast pain x one month that is greater within the upper breast. Patient stated the pain comes and goes. Patient rates the pain at a 7.5 out of 10.  ?  ?Pap Smear: Pap smear not completed today. Last Pap smear was 10/09/2020 at [facility] clinic and was normal with negative HPV. Per patient has no history of an abnormal Pap smear. Last Pap smear result is available in Epic. ?  ?Physical exam: ?Breasts ?Left breast larger than right breast that per patient has not noticed any changes. No skin abnormalities right breast. 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 No nipple retraction bilateral breasts. No nipple discharge bilateral breasts. No lymphadenopathy. No lumps palpated left breast. Palpated a bb sized lump within the right breast at 3 o'clock next to nipple. Complaints of diffuse left breast pain on exam. Complaints of right outer breast pain and pain when palpated right breast lump on exam. ? ?MM Breast Surgical Specimen ? ?Addendum Date: 02/03/2021   ?ADDENDUM REPORT: 02/03/2021 09:56 ADDENDUM: The needle loc report was erroneously attached to this specimen image. The dictation for the surgical specimen follows: CLINICAL DATA:  Specimen radiograph status post left breast excisional biopsy. EXAM: SPECIMEN RADIOGRAPH OF THE LEFT BREAST COMPARISON:  Previous exam(s). FINDINGS: Status post excision of the left breast. The wire tip and the targeted Q shaped biopsy marker clip, as well as the adjacent X shaped biopsy marking clip are present within the specimen. IMPRESSION: Specimen radiograph of the left breast. Electronically Signed   By: Ammie Ferrier M.D.   On: 02/03/2021 09:56  ? ?Result Date: 02/03/2021 ?CLINICAL DATA:  51 year old female presenting for wire localization of a left breast mass at 2 o'clock for excisional biopsy. EXAM: NEEDLE  LOCALIZATION OF THE LEFT BREAST WITH MAMMO GUIDANCE COMPARISON:  Previous exams. PROCEDURE: Patient presents for needle localization prior to excisional biopsy of the left breast. I met with the patient and we discussed the procedure of needle localization including benefits and alternatives. We discussed the high likelihood of a successful procedure. We discussed the risks of the procedure, including infection, bleeding, tissue injury, and further surgery. Informed, written consent was given. The usual time-out protocol was performed immediately prior to the procedure. Using mammographic guidance, sterile technique, 1% lidocaine and a 7 cm modified Kopans needle, the Q shaped biopsy marking clip at the site of the mass at 2 o'clock was localized using lateral approach. The images were marked for Dr. Bary Castilla. IMPRESSION: Needle localization left breast. No apparent complications. Electronically Signed: By: Ammie Ferrier M.D. On: 02/03/2021 08:34 ? ?MS DIGITAL SCREENING TOMO BILATERAL ? ?Result Date: 10/11/2020 ?CLINICAL DATA:  Screening. EXAM: DIGITAL SCREENING BILATERAL MAMMOGRAM WITH TOMOSYNTHESIS AND CAD TECHNIQUE: Bilateral screening digital craniocaudal and mediolateral oblique mammograms were obtained. Bilateral screening digital breast tomosynthesis was performed. The images were evaluated with computer-aided detection. COMPARISON:  Previous exam(s). ACR Breast Density Category c: The breast tissue is heterogeneously dense, which may obscure small masses. FINDINGS: In the right breast a possible mass requires further evaluation. In the left breast a possible mass requires further evaluation. The images were evaluated with computer-aided detection. IMPRESSION: Further evaluation is suggested for possible mass in the right breast. Further evaluation is suggested for possible mass in the left breast. RECOMMENDATION: Diagnostic mammogram and possibly ultrasound of both breasts. (Code:[id number]-78M) The patient will  be contacted  regarding the findings, and additional imaging will be scheduled. BI-RADS CATEGORY  0: Incomplete. Need additional imaging evaluation and/or prior mammograms for comparison. Electronically Signed   By: Audie Pinto M.D.   On: 10/11/2020 11:30  ? ?MS DIGITAL SCREENING TOMO BILATERAL ? ?Result Date: 06/14/2019 ?CLINICAL DATA:  Screening. EXAM: DIGITAL SCREENING BILATERAL MAMMOGRAM WITH TOMO AND CAD COMPARISON:  Previous exam(s). ACR Breast Density Category c: The breast tissue is heterogeneously dense, which may obscure small masses. FINDINGS: There are no findings suspicious for malignancy. Images were processed with CAD. IMPRESSION: No mammographic evidence of malignancy. A result letter of this screening mammogram will be mailed directly to the patient. RECOMMENDATION: Screening mammogram in one year. (Code:[id number]) BI-RADS CATEGORY  1: Negative. Electronically Signed   By: Fidela Salisbury M.D.   On: 06/14/2019 17:51  ? ?MS DIGITAL DIAGNOSTIC BILATERAL ? ?Result Date: 11/11/2020 ?CLINICAL DATA:  Status post ultrasound-guided core biopsy of mass in the 8 o'clock location of the RIGHT breast and 2 o'clock location of the LEFT breast. EXAM: 3D DIAGNOSTIC BILATERAL MAMMOGRAM POST ULTRASOUND BIOPSY x2 COMPARISON:  Previous exam(s). FINDINGS: 3D Mammographic images were obtained following ultrasound guided biopsy of mass in the 8 o'clock location of the RIGHT breast and placement of a vision shaped clip. The biopsy marking clip is in expected position at the site of biopsy. Following biopsy of mass in the 2 o'clock location of the LEFT breast and placement of Q shaped clip, the mass is identified in the [place] as expected. However, this does not correlate with the mass identified at the time of screening and diagnostic evaluation. Therefore, followup LEFT breast ultrasound is performed and confirms presence of a circumscribed oval homogeneous hypoechoic mass in the 5 o'clock location 4  centimeters from the nipple measuring 0.9 x 0.4 x 0.7 centimeters. This mass correlates well with the findings seen on initial screening mammogram. Patient reports numerous palpable cervical lymph nodes. On physical exam I palpate discrete firm mildly enlarged lymph nodes in the anterior cervical chain and submandibular region. Patient reports a recent thyroid ultrasound performed at [facility]. This report comments on numerous mildly enlarged cervical lymph nodes, largest 1.0 centimeters. IMPRESSION: 1. Appropriate positioning of the vision shaped shaped biopsy marking clip at the site of biopsy in the mass in the LOWER OUTER QUADRANT of the RIGHT breast. 2. The Q shaped clip in the LEFT breast correlates well with the mass biopsied in the 2 o'clock location. However, this is a discrete mass compared with the initial screening study. 3. Additional benign appearing masses are identified in the LEFT breast 2 o'clock location 6 centimeters from the nipple and 5 o'clock location 4 centimeters from the nipple. Recommend followup LEFT breast ultrasound in 6 months to assess stability of these findings. 4. Cervical and bilateral axillary adenopathy. Recommend further evaluation. Final Assessment: Post Procedure Mammograms for Marker Placement These results were called by telephone at the time of interpretation on 11/11/2020 at 11:34 am to provider [facility] , who verbally acknowledged these results. Electronically Signed   By: Nolon Nations M.D.   On: 11/11/2020 11:34  ? ?MS DIGITAL DIAG TOMO BILAT ? ?Result Date: 10/24/2020 ?CLINICAL DATA:  Bilateral breast asymmetry seen on most recent screening mammography. EXAM: DIGITAL DIAGNOSTIC BILATERAL MAMMOGRAM WITH TOMOSYNTHESIS AND CAD; ULTRASOUND LEFT BREAST LIMITED; ULTRASOUND RIGHT BREAST LIMITED TECHNIQUE: Bilateral digital diagnostic mammography and breast tomosynthesis was performed. The images were evaluated with computer-aided detection.; Targeted ultrasound  examination of the left breast was performed;  Targeted ultrasound examination of the right breast was performed COMPARISON:  Previous exam(s). ACR Breast Density Category c: The breast tissue is heterogeneously dense,

## 2021-12-18 ENCOUNTER — Other Ambulatory Visit: Payer: Self-pay | Admitting: Obstetrics and Gynecology

## 2021-12-18 DIAGNOSIS — N644 Mastodynia: Secondary | ICD-10-CM

## 2021-12-18 DIAGNOSIS — R599 Enlarged lymph nodes, unspecified: Secondary | ICD-10-CM

## 2021-12-18 DIAGNOSIS — Z1231 Encounter for screening mammogram for malignant neoplasm of breast: Secondary | ICD-10-CM

## 2021-12-18 DIAGNOSIS — N6315 Unspecified lump in the right breast, overlapping quadrants: Secondary | ICD-10-CM

## 2021-12-23 ENCOUNTER — Other Ambulatory Visit: Payer: Self-pay

## 2021-12-23 ENCOUNTER — Inpatient Hospital Stay: Admission: RE | Admit: 2021-12-23 | Payer: Self-pay | Source: Ambulatory Visit

## 2022-01-07 ENCOUNTER — Ambulatory Visit
Admission: RE | Admit: 2022-01-07 | Discharge: 2022-01-07 | Disposition: A | Discharge status: Home / Self Care | Payer: Self-pay | Source: Ambulatory Visit | Attending: Obstetrics and Gynecology | Admitting: Obstetrics and Gynecology

## 2022-01-07 DIAGNOSIS — N6315 Unspecified lump in the right breast, overlapping quadrants: Secondary | ICD-10-CM

## 2022-01-07 DIAGNOSIS — N644 Mastodynia: Secondary | ICD-10-CM

## 2022-01-07 DIAGNOSIS — Z1231 Encounter for screening mammogram for malignant neoplasm of breast: Secondary | ICD-10-CM | POA: Insufficient documentation

## 2022-01-07 DIAGNOSIS — R599 Enlarged lymph nodes, unspecified: Secondary | ICD-10-CM

## 2022-01-08 ENCOUNTER — Other Ambulatory Visit: Payer: Self-pay | Admitting: Obstetrics and Gynecology

## 2022-01-08 DIAGNOSIS — N63 Unspecified lump in unspecified breast: Secondary | ICD-10-CM

## 2022-01-08 DIAGNOSIS — R928 Other abnormal and inconclusive findings on diagnostic imaging of breast: Secondary | ICD-10-CM

## 2022-01-21 ENCOUNTER — Ambulatory Visit
Admission: RE | Admit: 2022-01-21 | Discharge: 2022-01-21 | Disposition: A | Discharge status: Home / Self Care | Payer: Self-pay | Source: Ambulatory Visit | Attending: Obstetrics and Gynecology | Admitting: Obstetrics and Gynecology

## 2022-01-21 DIAGNOSIS — N63 Unspecified lump in unspecified breast: Secondary | ICD-10-CM

## 2022-01-21 DIAGNOSIS — R928 Other abnormal and inconclusive findings on diagnostic imaging of breast: Secondary | ICD-10-CM | POA: Insufficient documentation

## 2022-01-21 HISTORY — PX: BREAST BIOPSY: SHX20

## 2022-01-23 LAB — SURGICAL PATHOLOGY

## 2022-01-30 ENCOUNTER — Telehealth: Payer: Self-pay

## 2022-01-30 NOTE — Telephone Encounter (Signed)
Patient informed breast biopsy results, atypical nodular adenosis. Per Dr. Jolayne Panther, needs surgical consult to order MRI. Discussed diagnosis, will call back with surgical consult appointment. Patient verbalized understanding.

## 2022-02-08 ENCOUNTER — Other Ambulatory Visit: Payer: Self-pay | Admitting: General Surgery

## 2022-02-08 NOTE — Progress Notes (Signed)
Progress Notes - documented in this encounter Geraldin Habermehl, Geronimo Boot, MD - 02/05/2022 11:30 AM EDT Formatting of this note is different from the original. Images from the original note were not included. Subjective:   Patient ID: Megan [name] is a 51 y.o. female.  HPI  The following portions of the patient's history were reviewed and updated as appropriate.  This an established patient is here today for: office visit. Here for evaluation post right breast biopsy 01-21-22. She states they recommend a breast MRI. She is recovering from shingles.  Review of Systems  Constitutional: Negative for chills and fever.  Respiratory: Negative for cough.   Chief Complaint  Patient presents with  Breast Problem    BP 118/72  Pulse 95  Temp 36.8 C (98.3 F)  Ht 165.1 cm (5\' 5" )  Wt (!) 105.7 kg (233 lb)  LMP 01/21/2022  SpO2 97%  BMI 38.77 kg/m   Past Medical History:  Diagnosis Date  Asthma  Hypothyroidism  Morbid obesity (CMS-HCC)    Past Surgical History:  Procedure Laterality Date  BREAST EXCISIONAL BIOPSY Right 11/11/2020  BREAST EXCISIONAL BIOPSY Left 11/11/2020  BREAST EXCISIONAL BIOPSY Left 12/03/2020  x 2  BREAST EXCISIONAL BIOPSY Left 02/03/2021  Multifocal fibroadenoma. No atypica or malignancy.  BREAST EXCISIONAL BIOPSY Left 02/11/2021  fibroadenoma by Dr Bary Castilla  BREAST EXCISIONAL BIOPSY Right 01/21/2022  ATYPICAL NODULAR ADENOSIS.  CESAREAN SECTION  foot surgery  LAPAROSCOPIC TUBAL LIGATION  LAPAROSCOPIC TUBAL LIGATION    OB History   Gravida  2  Para  2  Term   Preterm   AB   Living     SAB   IAB   Ectopic   Molar   Multiple   Live Births     Obstetric Comments  Age at first period 7 Age of first pregnancy 12  Daughter passed at age 70      Social History   Socioeconomic History  Marital status: Single  Tobacco Use  Smoking status: Never  Smokeless tobacco: Never  Substance and Sexual Activity  Alcohol  use: Never  Drug use: Never    No Known Allergies  Current Outpatient Medications  Medication Sig Dispense Refill  albuterol sulfate 90 mcg/actuation aebs Inhale 180 mcg into the lungs every 4 (four) hours  ascorbic acid, vitamin C, 500 mg Chew Take 1 tablet by mouth once daily  BIKTARVY 50-200-25 mg tablet Take 1 tablet by mouth once daily  cholecalciferol (VITAMIN D3) 2,000 unit capsule Take 2,000 Units by mouth once daily  cyanocobalamin (VITAMIN B12) 500 MCG tablet Take 1 tablet by mouth once daily  dexAMETHasone (MAXIDEX) 0.1 % ophthalmic suspension Apply to eye  diclofenac (VOLTAREN) 75 MG EC tablet Take by mouth as needed  erythromycin (ROMYCIN) ophthalmic ointment Apply to eye  gabapentin (NEURONTIN) 300 MG capsule Take 300 mg by mouth 3 (three) times daily 1 am and evening 2 tab at night  hydrocortisone 2.5 % cream as needed  hydrOXYzine HCL (ATARAX) 10 MG tablet Take 10 mg by mouth 3 (three) times daily as needed  levothyroxine (SYNTHROID) 88 MCG tablet Take 88 mcg by mouth once daily Take on an empty stomach with a glass of water at least 30-60 minutes before breakfast.  loratadine (CLARITIN) 10 mg tablet Take 10 mg by mouth as needed  PARoxetine (PAXIL) 10 MG tablet Take 10 mg by mouth once daily  prednisoLONE acetate (PRED FORTE) 1 % ophthalmic suspension Place 1 drop into the left eye once daily  traZODone (DESYREL)  100 MG tablet Take 100 mg by mouth nightly  TRUEPLUS PEN NEEDLE 31 gauge x 1/4" needle as directed  VICTOZA 3-PAK 0.6 mg/0.1 mL (18 mg/3 mL) pen injector Inject subcutaneously once daily  ergocalciferol, vitamin D2, 1,250 mcg (50,000 unit) capsule Take 50,000 Units by mouth every 7 (seven) days (Patient not taking: Reported on 02/05/2022)  oxyCODONE (ROXICODONE) 5 MG immediate release tablet Take by mouth (Patient not taking: Reported on 02/05/2022)   No current facility-administered medications for this visit.   Family History  Problem Relation Age of Onset   Rheum arthritis Mother  Thyroid disease Mother  High blood pressure (Hypertension) Mother  Diabetes Mother  Breast cancer Maternal Grandmother  Cancer Paternal Grandmother  Colon cancer Neg Hx   Labs and Radiology:   Jan 21, 2022 right breast core biopsy:  . BREAST MASS, RIGHT 3:00 RETROAREOLAR; ULTRASOUND-GUIDED BIOPSY:  - ATYPICAL NODULAR ADENOSIS  Pathology reviewed with the attending pathologist. Nodular adenosis is described as a nodular area of mammograms with epithelial atypia. Typical recommendation is for conservative excision of the lesion.  Imaging review:  Studies from Jan 07, 2022 through Jan 21, 2022 were independently reviewed.  Radiologist notation of "high risk" on above atypical findings noted.  Gait model risk: 5-year: 1.5%, lifetime 11.1%.  Tyrer-Cusick Risk calculation: 10 year risk: 13.5%; Lifetime:    Objective:  Physical Exam Exam conducted with a chaperone present.  Constitutional:  Appearance: Normal appearance.  Cardiovascular:  Rate and Rhythm: Normal rate and regular rhythm.  Pulses: Normal pulses.  Heart sounds: Normal heart sounds.  Pulmonary:  Effort: Pulmonary effort is normal.  Breath sounds: Normal breath sounds.  Chest:  Breasts: Left: No skin change. Swelling: right peri-areolar mass, site of recent biopsy..  Musculoskeletal:  Cervical back: Neck supple.  Lymphadenopathy:  Upper Body:  Right upper body: No supraclavicular or axillary adenopathy.  Left upper body: No supraclavicular or axillary adenopathy.  Skin: General: Skin is warm and dry.  Neurological:  Mental Status: She is alert and oriented to person, place, and time.  Psychiatric:  Mood and Affect: Mood normal.  Behavior: Behavior normal.    Assessment:   Low-grade atypia noted on palpable nodule, new from 2022.  Upcoming wedding.  Plan:   The area in the right breast should be excised to confirm a benign histology.  There is wild disparity between  the 2 risk assessment models with a range of 11 to 42% for lifetime risk of breast cancer. I am less than impressed with the high risk score and have advised against routine MRI imaging based on the high risk of false positive disease and low likelihood of changing her survival should cancer develop.  The patient is a candidate for office or hospital excision of the superficial lesion. She will notify the office of how she would like to proceed after the wedding scheduled 2 weeks from this weekend.   This note is partially prepared by Karie Fetch, RN, acting as a scribe in the presence of Dr. Hervey Ard, MD.  The documentation recorded by the scribe accurately reflects the service I personally performed and the decisions made by me.   Robert Bellow, MD FACS

## 2022-03-27 ENCOUNTER — Other Ambulatory Visit: Payer: Self-pay

## 2022-03-27 ENCOUNTER — Encounter
Admission: RE | Admit: 2022-03-27 | Discharge: 2022-03-27 | Disposition: A | Discharge status: Home / Self Care | Payer: Self-pay | Source: Ambulatory Visit | Attending: General Surgery | Admitting: General Surgery

## 2022-03-27 VITALS — Ht 65.0 in | Wt 230.0 lb

## 2022-03-27 DIAGNOSIS — Z01818 Encounter for other preprocedural examination: Secondary | ICD-10-CM

## 2022-03-27 NOTE — Patient Instructions (Signed)
Your procedure is scheduled on: 04/06/22 Report to DAY SURGERY DEPARTMENT LOCATED ON 2ND FLOOR MEDICAL MALL ENTRANCE. To find out your arrival time please call [phone] between 1PM - 3PM on 04/03/22.  Remember: Instructions that are not followed completely may result in serious medical risk, up to and including death, or upon the discretion of your surgeon and anesthesiologist your surgery may need to be rescheduled.     _X__ 1. Do not eat food after midnight the night before your procedure.                 No gum chewing or hard candies. You may drink clear liquids up to 2 hours                 before you are scheduled to arrive for your surgery- DO not drink clear                 liquids within 2 hours of the start of your surgery.                 Clear Liquids include:  water, apple juice without pulp, clear carbohydrate                 drink such as Clearfast or Gatorade, Black Coffee or Tea (Do not add                 anything to coffee or tea). Diabetics water only  __X__2.  On the morning of surgery brush your teeth with toothpaste and water, you                 may rinse your mouth with mouthwash if you wish.  Do not swallow any              toothpaste of mouthwash.     _X__ 3.  No Alcohol for 24 hours before or after surgery.   _X__ 4.  Do Not Smoke or use e-cigarettes For 24 Hours Prior to Your Surgery.                 Do not use any chewable tobacco products for at least 6 hours prior to                 surgery.  ____  5.  Bring all medications with you on the day of surgery if instructed.   __X__  6.  Notify your doctor if there is any change in your medical condition      (cold, fever, infections).     Do not wear jewelry, make-up, hairpins, clips or nail polish. Do not wear lotions, powders, or perfumes. NO DEODORANT Do not shave body hair 48 hours prior to surgery. Men may shave face and neck. Do not bring valuables to the hospital.    [facility] is not responsible  for any belongings or valuables.  Contacts, dentures/partials or body piercings may not be worn into surgery. Bring a case for your contacts, glasses or hearing aids, a denture cup will be supplied. Leave your suitcase in the car. After surgery it may be brought to your room. For patients admitted to the hospital, discharge time is determined by your treatment team.   Patients discharged the day of surgery will not be allowed to drive home.    __X__ Take these medicines the morning of surgery with A SIP OF WATER:    1. BIKTARVY 50-200-25 MG TABS tablet  2. levothyroxine (SYNTHROID) 88  MCG tablet  3.   4.  5.  6.  ____ Fleet Enema (as directed)   ____ Use CHG Soap/SAGE wipes as directed  ____ Use inhalers on the day of surgery  ____ Stop metformin/Janumet/Farxiga 2 days prior to surgery    ____ Take 1/2 of usual insulin dose the night before surgery. No insulin the morning          of surgery.   ____ Stop Blood Thinners Coumadin/Plavix/Xarelto/Pleta/Pradaxa/Eliquis/Effient/Aspirin  on   Or contact your Surgeon, Cardiologist or Medical Doctor regarding  ability to stop your blood thinners  __X__ Stop Anti-inflammatories 7 days before surgery such as Advil, Ibuprofen, Motrin,  BC or Goodies Powder, Naprosyn, Naproxen, Aleve, Aspirin    __X__ Stop all herbal s and supplements, fish oil or vitamins from 03/30/22 until after surgery.    ____ Bring C-Pap to the hospital.

## 2022-04-05 MED ORDER — CHLORHEXIDINE GLUCONATE CLOTH 2 % EX PADS: 6.0000 | MEDICATED_PAD | Freq: Once | CUTANEOUS | Status: DC

## 2022-04-05 MED ORDER — CHLORHEXIDINE GLUCONATE 0.12 % MT SOLN: 15.0000 mL | Freq: Once | OROMUCOSAL | Status: AC

## 2022-04-05 MED ORDER — FAMOTIDINE 20 MG PO TABS: 20.0000 mg | ORAL_TABLET | Freq: Once | ORAL | Status: AC

## 2022-04-05 MED ORDER — ORAL CARE MOUTH RINSE: 15.0000 mL | Freq: Once | OROMUCOSAL | Status: AC

## 2022-04-05 MED ORDER — LACTATED RINGERS IV SOLN: INTRAVENOUS | Status: DC

## 2022-04-06 ENCOUNTER — Ambulatory Visit
Admission: RE | Admit: 2022-04-06 | Discharge: 2022-04-06 | Disposition: A | Discharge status: Home / Self Care | Payer: Self-pay | Source: Home / Self Care | Attending: General Surgery | Admitting: General Surgery

## 2022-04-06 ENCOUNTER — Encounter
Admission: RE | Disposition: A | Discharge status: Home / Self Care | Payer: Self-pay | Source: Home / Self Care | Attending: General Surgery

## 2022-04-06 ENCOUNTER — Ambulatory Visit
Admission: RE | Admit: 2022-04-06 | Discharge: 2022-04-06 | Disposition: A | Discharge status: Home / Self Care | Payer: Self-pay | Attending: General Surgery | Admitting: General Surgery

## 2022-04-06 ENCOUNTER — Ambulatory Visit: Payer: Self-pay | Admitting: Certified Registered"

## 2022-04-06 ENCOUNTER — Other Ambulatory Visit: Payer: Self-pay

## 2022-04-06 ENCOUNTER — Encounter: Payer: Self-pay | Admitting: General Surgery

## 2022-04-06 DIAGNOSIS — Z01818 Encounter for other preprocedural examination: Secondary | ICD-10-CM

## 2022-04-06 DIAGNOSIS — N6310 Unspecified lump in the right breast, unspecified quadrant: Secondary | ICD-10-CM

## 2022-04-06 HISTORY — PX: BREAST BIOPSY: SHX20

## 2022-04-06 LAB — POCT PREGNANCY, URINE: Preg Test, Ur: NEGATIVE

## 2022-04-06 SURGERY — BREAST BIOPSY
Anesthesia: Monitor Anesthesia Care | Site: Breast | Laterality: Right

## 2022-04-06 MED ORDER — PROPOFOL 10 MG/ML IV BOLUS
INTRAVENOUS | Status: AC
  Filled 2022-04-06: qty 20

## 2022-04-06 MED ORDER — HYDROCODONE-ACETAMINOPHEN 5-325 MG PO TABS: 1.0000 | ORAL_TABLET | ORAL | 0 refills | Status: AC | PRN

## 2022-04-06 MED ORDER — FLUMAZENIL 0.5 MG/5ML IV SOLN
0.3000 mg | Freq: Once | INTRAVENOUS | Status: AC
  Administered 2022-04-06: 0.3 mg via INTRAVENOUS

## 2022-04-06 MED ORDER — LIDOCAINE HCL (PF) 2 % IJ SOLN
INTRAMUSCULAR | Status: AC
  Filled 2022-04-06: qty 5

## 2022-04-06 MED ORDER — ACETAMINOPHEN 10 MG/ML IV SOLN
INTRAVENOUS | Status: DC | PRN
  Administered 2022-04-06: 1000 mg via INTRAVENOUS

## 2022-04-06 MED ORDER — MIDAZOLAM HCL 2 MG/2ML IJ SOLN
INTRAMUSCULAR | Status: AC
  Filled 2022-04-06: qty 2

## 2022-04-06 MED ORDER — FLUMAZENIL 0.5 MG/5ML IV SOLN
INTRAVENOUS | Status: AC
  Filled 2022-04-06: qty 5

## 2022-04-06 MED ORDER — FENTANYL CITRATE (PF) 100 MCG/2ML IJ SOLN: 25.0000 ug | INTRAMUSCULAR | Status: DC | PRN

## 2022-04-06 MED ORDER — ACETAMINOPHEN 10 MG/ML IV SOLN
INTRAVENOUS | Status: AC
  Filled 2022-04-06: qty 100

## 2022-04-06 MED ORDER — DEXAMETHASONE SODIUM PHOSPHATE 10 MG/ML IJ SOLN
INTRAMUSCULAR | Status: AC
  Filled 2022-04-06: qty 1

## 2022-04-06 MED ORDER — PROPOFOL 500 MG/50ML IV EMUL
INTRAVENOUS | Status: DC | PRN
  Administered 2022-04-06: 95 ug/kg/min via INTRAVENOUS

## 2022-04-06 MED ORDER — ONDANSETRON HCL 4 MG/2ML IJ SOLN
INTRAMUSCULAR | Status: DC | PRN
  Administered 2022-04-06: 4 mg via INTRAVENOUS

## 2022-04-06 MED ORDER — BUPIVACAINE-EPINEPHRINE (PF) 0.5% -1:200000 IJ SOLN
INTRAMUSCULAR | Status: AC
  Filled 2022-04-06: qty 30

## 2022-04-06 MED ORDER — OXYCODONE HCL 5 MG PO TABS
ORAL_TABLET | ORAL | Status: AC
  Filled 2022-04-06: qty 1

## 2022-04-06 MED ORDER — FENTANYL CITRATE (PF) 100 MCG/2ML IJ SOLN
INTRAMUSCULAR | Status: AC
  Filled 2022-04-06: qty 2

## 2022-04-06 MED ORDER — OXYCODONE HCL 5 MG PO TABS
5.0000 mg | ORAL_TABLET | Freq: Once | ORAL | Status: AC | PRN
  Administered 2022-04-06: 5 mg via ORAL

## 2022-04-06 MED ORDER — CHLORHEXIDINE GLUCONATE 0.12 % MT SOLN
OROMUCOSAL | Status: AC
  Administered 2022-04-06: 15 mL via OROMUCOSAL
  Filled 2022-04-06: qty 15

## 2022-04-06 MED ORDER — OXYCODONE HCL 5 MG/5ML PO SOLN: 5.0000 mg | Freq: Once | ORAL | Status: AC | PRN

## 2022-04-06 MED ORDER — DEXAMETHASONE SODIUM PHOSPHATE 4 MG/ML IJ SOLN
INTRAMUSCULAR | Status: DC | PRN
  Administered 2022-04-06: 10 mg via INTRAVENOUS

## 2022-04-06 MED ORDER — FAMOTIDINE 20 MG PO TABS
ORAL_TABLET | ORAL | Status: AC
  Administered 2022-04-06: 20 mg via ORAL
  Filled 2022-04-06: qty 1

## 2022-04-06 MED ORDER — STERILE WATER FOR IRRIGATION IR SOLN
Status: DC | PRN
  Administered 2022-04-06: 1000 mL

## 2022-04-06 MED ORDER — LIDOCAINE HCL (PF) 1 % IJ SOLN
INTRAMUSCULAR | Status: AC
  Filled 2022-04-06: qty 30

## 2022-04-06 MED ORDER — LIDOCAINE HCL 1 % IJ SOLN
INTRAMUSCULAR | Status: DC | PRN
  Administered 2022-04-06: 30 mL

## 2022-04-06 MED ORDER — ONDANSETRON HCL 4 MG/2ML IJ SOLN
INTRAMUSCULAR | Status: AC
  Filled 2022-04-06: qty 2

## 2022-04-06 MED ORDER — FENTANYL CITRATE (PF) 100 MCG/2ML IJ SOLN
INTRAMUSCULAR | Status: DC | PRN
  Administered 2022-04-06 (×2): 50 ug via INTRAVENOUS

## 2022-04-06 MED ORDER — MIDAZOLAM HCL 2 MG/2ML IJ SOLN
INTRAMUSCULAR | Status: DC | PRN
  Administered 2022-04-06: 2 mg via INTRAVENOUS

## 2022-04-06 MED ORDER — LIDOCAINE HCL (CARDIAC) PF 100 MG/5ML IV SOSY
PREFILLED_SYRINGE | INTRAVENOUS | Status: DC | PRN
  Administered 2022-04-06: 40 mg via INTRAVENOUS

## 2022-04-06 SURGICAL SUPPLY — 45 items
APL PRP STRL LF DISP 70% ISPRP (MISCELLANEOUS) ×1
BINDER BREAST LRG (GAUZE/BANDAGES/DRESSINGS) ×1 IMPLANT
BLADE SURG 15 STRL SS SAFETY (BLADE) ×4 IMPLANT
CHLORAPREP W/TINT 26 (MISCELLANEOUS) ×2 IMPLANT
CNTNR SPEC 2.5X3XGRAD LEK (MISCELLANEOUS)
CONT SPEC 4OZ STER OR WHT (MISCELLANEOUS)
CONT SPEC 4OZ STRL OR WHT (MISCELLANEOUS)
CONTAINER SPEC 2.5X3XGRAD LEK (MISCELLANEOUS) IMPLANT
COVER PROBE FLX POLY STRL (MISCELLANEOUS) ×2 IMPLANT
DEVICE DUBIN SPECIMEN MAMMOGRA (MISCELLANEOUS) ×2 IMPLANT
DRAPE LAPAROTOMY 100X77 ABD (DRAPES) ×2 IMPLANT
DRSG GAUZE FLUFF 36X18 (GAUZE/BANDAGES/DRESSINGS) ×2 IMPLANT
DRSG TELFA 3X4 N-ADH STERILE (GAUZE/BANDAGES/DRESSINGS) ×2 IMPLANT
ELECT CAUTERY BLADE TIP 2.5 (TIP) ×2
ELECT REM PT RETURN 9FT ADLT (ELECTROSURGICAL) ×2
ELECTRODE CAUTERY BLDE TIP 2.5 (TIP) ×1 IMPLANT
ELECTRODE REM PT RTRN 9FT ADLT (ELECTROSURGICAL) ×1 IMPLANT
GAUZE 4X4 16PLY ~~LOC~~+RFID DBL (SPONGE) ×2 IMPLANT
GLOVE BIO SURGEON STRL SZ7.5 (GLOVE) ×2 IMPLANT
GLOVE SURG UNDER LTX SZ8 (GLOVE) ×2 IMPLANT
GOWN STRL REUS W/ TWL LRG LVL3 (GOWN DISPOSABLE) ×2 IMPLANT
GOWN STRL REUS W/TWL LRG LVL3 (GOWN DISPOSABLE) ×4
KIT TURNOVER KIT A (KITS) ×2 IMPLANT
LABEL OR SOLS (LABEL) ×2 IMPLANT
MANIFOLD NEPTUNE II (INSTRUMENTS) ×2 IMPLANT
MARGIN MAP 10MM (MISCELLANEOUS) ×2 IMPLANT
NDL HYPO 25X1 1.5 SAFETY (NEEDLE) ×1 IMPLANT
NEEDLE HYPO 22GX1.5 SAFETY (NEEDLE) ×2 IMPLANT
NEEDLE HYPO 25X1 1.5 SAFETY (NEEDLE) ×2 IMPLANT
PACK BASIN MINOR ARMC (MISCELLANEOUS) ×2 IMPLANT
RETRACTOR RING XSMALL (MISCELLANEOUS) IMPLANT
RTRCTR WOUND ALEXIS 13CM XS SH (MISCELLANEOUS)
SHEARS HARMONIC 9CM CVD (BLADE) IMPLANT
STRIP CLOSURE SKIN 1/2X4 (GAUZE/BANDAGES/DRESSINGS) ×2 IMPLANT
SUT ETHILON 3-0 FS-10 30 BLK (SUTURE) ×2
SUT VIC AB 2-0 CT1 27 (SUTURE) ×2
SUT VIC AB 2-0 CT1 TAPERPNT 27 (SUTURE) ×2 IMPLANT
SUT VIC AB 4-0 FS2 27 (SUTURE) ×2 IMPLANT
SUTURE EHLN 3-0 FS-10 30 BLK (SUTURE) ×1 IMPLANT
SWABSTK COMLB BENZOIN TINCTURE (MISCELLANEOUS) ×2 IMPLANT
SYR 10ML LL (SYRINGE) ×2 IMPLANT
TAPE TRANSPORE STRL 2 31045 (GAUZE/BANDAGES/DRESSINGS) IMPLANT
TRAP NEPTUNE SPECIMEN COLLECT (MISCELLANEOUS) ×2 IMPLANT
WATER STERILE IRR 1000ML POUR (IV SOLUTION) ×2 IMPLANT
WATER STERILE IRR 500ML POUR (IV SOLUTION) ×2 IMPLANT

## 2022-04-06 NOTE — Op Note (Signed)
Preoperative diagnosis: Right breast nodule with atypia.  Postoperative diagnosis: Same.  Operative procedure: Ultrasound-guided excision of right breast mass.  Operating Surgeon: Donnalee Curry, MD.  Anesthesia: Monitored anesthesia, 30 cc 0.5% Xylocaine with 0.25% Marcaine with 1: 400,000 units of epinephrine.  Estimated blood loss: 1 cc.  Clinical note: This 51 year old woman had a mammogram and with a nodular area in the superficial tissue of the right breast in the 3 o'clock position.  Core biopsy showed atypia and fibrosis.  She felt to be candidate for excision.  Operative note: With the patient under active treatment sedation the breast was cleansed with ChloraPrep and draped.  The area was identified by ultrasound and image placed in the chart for the permanent record.  A field block anesthesia was achieved with the above-noted local anesthetic.  A circumareolar incision from 1 to 5 o'clock position was made.  Skin was incised sharply remaining dissection electrocautery.  Once the skin was excised there is firm keratin like mass was identified.  A 1 and half centimeter ball of tissue was excised orientated and specimen radiograph confirmed the previously placed clip.  This was sent for routine processing.  Hemostasis was electrocautery.  The deep tissue was approximated in layers with interrupted 2-0 Vicryl figure-of-eight sutures.  The skin was closed with running 4-0 Vicryl subcuticular suture.  Benzoin, Steri-Strips, Telfa, fluff gauze and a compressive wrap were applied.  Patient tolerated procedure well was taken the PACU in stable condition.

## 2022-04-06 NOTE — Anesthesia Postprocedure Evaluation (Signed)
Anesthesia Post Note  Patient: Megan [name]  Procedure(s) Performed: BREAST BIOPSY (Right: Breast)  Patient location during evaluation: PACU Anesthesia Type: MAC Level of consciousness: awake and alert Pain management: pain level controlled Vital Signs Assessment: post-procedure vital signs reviewed and stable Respiratory status: spontaneous breathing, nonlabored ventilation, respiratory function stable and patient connected to nasal cannula oxygen Cardiovascular status: blood pressure returned to baseline and stable Postop Assessment: no apparent nausea or vomiting Anesthetic complications: no Comments: Slow to emerge    No notable events documented.   Last Vitals:  Vitals:   04/06/22 1216 04/06/22 1254  BP: (!) 143/90 129/72  Pulse: 85 93  Resp: 16 18  Temp: 36.6 C   SpO2: 100% 100%    Last Pain:  Vitals:   04/06/22 1254  TempSrc:   PainSc: 8                  Precious Haws Keyaria Lawson

## 2022-04-06 NOTE — Discharge Instructions (Addendum)
AMBULATORY SURGERY  DISCHARGE INSTRUCTIONS   The drugs that you were given will stay in your system until tomorrow so for the next 24 hours you should not:  Drive an automobile Make any legal decisions Drink any alcoholic beverage   You may resume regular meals tomorrow.  Today it is better to start with liquids and gradually work up to solid foods.  You may eat anything you prefer, but it is better to start with liquids, then soup and crackers, and gradually work up to solid foods.   Please notify your doctor immediately if you have any unusual bleeding, trouble breathing, redness and pain at the surgery site, drainage, fever, or pain not relieved by medication.    Additional Instructions:    TYLENOL 1,000 MG GIVEN AT THE HOSPITAL AT 7:56 AM; NEXT DOSE IF NEEDED CAN BE TAKEN PER MANUFACTURER'S INSTRUCTIONS REGARDING DOSAGE AND TIME FRAME.        Please contact your physician with any problems or Same Day Surgery at [phone], Monday through Friday 6 am to 4 pm, or  at [facility] Building number at [phone].

## 2022-04-06 NOTE — Transfer of Care (Signed)
Immediate Anesthesia Transfer of Care Note  Patient: Megan [name]  Procedure(s) Performed: BREAST BIOPSY (Right: Breast)  Patient Location: PACU  Anesthesia Type:MAC  Level of Consciousness: drowsy  Airway & Oxygen Therapy: Patient Spontanous Breathing and Patient connected to face mask oxygen  Post-op Assessment: Report given to RN and Post -op Vital signs reviewed and stable  Post vital signs: Reviewed and stable  Last Vitals:  Vitals Value Taken Time  BP 153/86 04/06/22 0816  Temp 36.3 C 04/06/22 0815  Pulse 85 04/06/22 0819  Resp 18 04/06/22 0819  SpO2 100 % 04/06/22 0819  Vitals shown include unvalidated device data.  Last Pain:  Vitals:   04/06/22 0646  TempSrc: Temporal  PainSc: 0-No pain         Complications: No notable events documented.

## 2022-04-06 NOTE — OR Nursing (Signed)
Update 12:58 PM; PT ALERT AND ORIENTED, PAIN 8/10 - HAD OXYCODONE IN PACU, STATES SHE'S COMFORTABLE WITH PAIN SCORE AND WANTS TO GO HOME, DISCUSSED WITH NURSE IN MD'S OR ROOM, STATES OK FOR HER TO BE DISCHARGED HOME.

## 2022-04-06 NOTE — OR Nursing (Signed)
PER DR. BYRNETT, VERBAL, HE DOES NOT NEED TO SEE PT IN POSTOP PRIOR TO DISCHARGE.

## 2022-04-06 NOTE — Anesthesia Preprocedure Evaluation (Signed)
Anesthesia Evaluation  Patient identified by MRN, date of birth, ID band Patient awake    Reviewed: Allergy & Precautions, NPO status , Patient's Chart, lab work & pertinent test results  History of Anesthesia Complications Negative for: history of anesthetic complications  Airway Mallampati: III  TM Distance: >3 FB Neck ROM: full    Dental  (+) Chipped   Pulmonary neg shortness of breath, asthma ,    Pulmonary exam normal        Cardiovascular Exercise Tolerance: Good (-) angina(-) Past MI and (-) DOE negative cardio ROS Normal cardiovascular exam     Neuro/Psych PSYCHIATRIC DISORDERS  Neuromuscular disease    GI/Hepatic negative GI ROS, Neg liver ROS,   Endo/Other  Hypothyroidism   Renal/GU negative Renal ROS  negative genitourinary   Musculoskeletal   Abdominal   Peds  Hematology negative hematology ROS (+)   Anesthesia Other Findings Past Medical History: No date: Anxiety No date: Asthma 08/2020: COVID-19 virus infection No date: Hypothyroidism No date: Morbid obesity (HCC)  Past Surgical History: 11/11/2020: BREAST BIOPSY; Right     Comment:  [place] bx, vision marker, - FRAGMENTS OF BENIGN INTRAMAMMARY              LYMPH NODE.  NEGATIVE FOR ATYPICAL PROLIFERATIVE BREAST               DISEASE. 11/11/2020`: BREAST BIOPSY; Left     Comment:  [place] bx, Q shape, FIBROEPITHELIAL PROLIFERATION WITH               SCLEROSIS. NEGATIVE FOR ATYPICAL PROLIFERATIVE BREAST               DISEASE IN THIS SAMPLE.  12/03/2020: BREAST BIOPSY; Left     Comment:  [place] bx, vision clip, fiboadenoma 12/03/2020: BREAST BIOPSY; Left     Comment:  [place] bx, x-clip, fibroadenoma 02/03/2021: BREAST BIOPSY; Left     Comment:  Procedure: BREAST BIOPSY WITH NEEDLE LOCALIZATION;                Surgeon: [name], MD;  Location: [facility] ORS;                Service: General;  Laterality: Left; 01/21/2022: BREAST BIOPSY; Right      Comment:  [place] core bx right breast ribbon clip path pending 02/03/2021: BREAST LUMPECTOMY; Left     Comment:  LT 2:00 X clip NL done prior to lumpectomy  No date: CESAREAN SECTION No date: FOOT SURGERY     Comment:  bone spur No date: TUBAL LIGATION  BMI    Body Mass Index: 38.27 kg/m      Reproductive/Obstetrics negative OB ROS                             Anesthesia Physical Anesthesia Plan  ASA: 3  Anesthesia Plan: General   Post-op Pain Management:    Induction: Intravenous  PONV Risk Score and Plan: Propofol infusion and TIVA  Airway Management Planned: Natural Airway and Nasal Cannula  Additional Equipment:   Intra-op Plan:   Post-operative Plan:   Informed Consent: I have reviewed the patients History and Physical, chart, labs and discussed the procedure including the risks, benefits and alternatives for the proposed anesthesia with the patient or authorized representative who has indicated his/her understanding and acceptance.     Dental Advisory Given  Plan Discussed with: Anesthesiologist, CRNA and Surgeon  Anesthesia Plan Comments: (Patient consented  for risks of anesthesia including but not limited to:  - adverse reactions to medications - risk of airway placement if required - damage to eyes, teeth, lips or other oral mucosa - nerve damage due to positioning  - sore throat or hoarseness - Damage to heart, brain, nerves, lungs, other parts of body or loss of life  Patient voiced understanding.)        Anesthesia Quick Evaluation

## 2022-04-06 NOTE — H&P (Signed)
Megan [name] [id number] 12/21/1970     HPI: Atypical cells on recent right breast biopsy.  For excision.   Medications Prior to Admission  Medication Sig Dispense Refill Last Dose   Albuterol Sulfate, sensor, (PROAIR DIGIHALER) 108 (90 Base) MCG/ACT AEPB Inhale 2 puffs into the lungs every 4 (four) hours.   04/06/2022 at 0600   BIKTARVY 50-200-25 MG TABS tablet Take 1 tablet by mouth daily.   04/06/2022 at 0530   dexamethasone (DECADRON) 0.1 % ophthalmic suspension Place 2 drops into both eyes every 4 (four) hours. 15 mL 0 04/05/2022   docusate (COLACE) 50 MG/5ML liquid Take 10 mLs (100 mg total) by mouth 2 (two) times daily as needed for mild constipation. 100 mL 0 04/05/2022   erythromycin ophthalmic ointment Place into the left eye every 8 (eight) hours. 3.5 g 0 04/05/2022   hydrocortisone 2.5 % cream Apply 1 Application topically once as needed.   04/05/2022   levothyroxine (SYNTHROID) 88 MCG tablet Take 88 mcg by mouth daily before breakfast.   04/06/2022 at 0530   ascorbic acid (VITAMIN C) 500 MG tablet Take 1 tablet (500 mg total) by mouth daily. 30 tablet 0 03/30/2022   Ascorbic Acid 500 MG CHEW Chew 1 tablet by mouth daily.   03/30/2022   capsaicin (ZOSTRIX) 0.025 % cream SMARTSIG:2 Topical Twice Daily (Patient not taking: Reported on 04/06/2022)   Not Taking   diclofenac (VOLTAREN) 75 MG EC tablet Take 75 mg by mouth 2 (two) times daily as needed.   03/30/2022   feeding supplement (ENSURE ENLIVE / ENSURE PLUS) LIQD Take 237 mLs by mouth 3 (three) times daily between meals. (Patient not taking: Reported on 12/17/2021) 237 mL 12    iron polysaccharides (NIFEREX) 150 MG capsule Take 1 capsule (150 mg total) by mouth daily. 30 capsule 0 03/30/2022   loratadine (CLARITIN) 10 MG tablet Take 10 mg by mouth daily as needed for allergies.   03/30/2022   oxyCODONE (OXY IR/ROXICODONE) 5 MG immediate release tablet Take 1 tablet (5 mg total) by mouth every 6 (six) hours as needed for moderate pain. (Patient not  taking: Reported on 12/17/2021) 10 tablet 0    PARoxetine (PAXIL) 20 MG tablet Take 20 mg by mouth daily.   04/03/2022   phenol (CHLORASEPTIC) 1.4 % LIQD Use as directed 1 spray in the mouth or throat as needed for throat irritation / pain. 118 mL 0    predniSONE (STERAPRED UNI-PAK 21 TAB) 10 MG (21) TBPK tablet Take 6 tablets on day 1, 5 tablets on day 2, 4 tablets on day 3, 3 tablets on day 4, 2 tablets on day 5, 1 tablet on day 6 and then stop on day 7 (Patient not taking: Reported on 12/17/2021) 21 tablet 0    traZODone (DESYREL) 50 MG tablet Take 50 mg by mouth at bedtime as needed for sleep.   04/03/2022   vitamin B-12 (CYANOCOBALAMIN) 500 MCG tablet Take 1 tablet (500 mcg total) by mouth daily. 30 tablet 0 03/30/2022   VITAMIN D PO Take 1 tablet by mouth daily.   03/30/2022   Vitamin D, Ergocalciferol, (DRISDOL) 1.25 MG (50000 UNIT) CAPS capsule Take 1 capsule (50,000 Units total) by mouth every 7 (seven) days. 5 capsule 0 03/30/2022   No Known Allergies Past Medical History:  Diagnosis Date   Anxiety    Asthma    COVID-19 virus infection 08/2020   Hypothyroidism    Morbid obesity (HCC)    Past Surgical History:  Procedure Laterality Date   BREAST BIOPSY Right 11/11/2020   [place] bx, vision marker, - FRAGMENTS OF BENIGN INTRAMAMMARY LYMPH NODE.  NEGATIVE FOR ATYPICAL PROLIFERATIVE BREAST DISEASE.   BREAST BIOPSY Left 11/11/2020`   [place] bx, Q shape, FIBROEPITHELIAL PROLIFERATION WITH SCLEROSIS. NEGATIVE FOR ATYPICAL PROLIFERATIVE BREAST DISEASE IN THIS SAMPLE.    BREAST BIOPSY Left 12/03/2020   [place] bx, vision clip, fiboadenoma   BREAST BIOPSY Left 12/03/2020   [place] bx, x-clip, fibroadenoma   BREAST BIOPSY Left 02/03/2021   Procedure: BREAST BIOPSY WITH NEEDLE LOCALIZATION;  Surgeon: [name], MD;  Location: [facility] ORS;  Service: General;  Laterality: Left;   BREAST BIOPSY Right 01/21/2022   [place] core bx right breast ribbon clip path pending   BREAST LUMPECTOMY Left 02/03/2021   LT 2:00 X  clip NL done prior to lumpectomy    CESAREAN SECTION     FOOT SURGERY     bone spur   TUBAL LIGATION     Social History   Socioeconomic History   Marital status: Single    Spouse name: Not on file   Number of children: Not on file   Years of education: Not on file   Highest education level: Not on file  Occupational History   Not on file  Tobacco Use   Smoking status: Never   Smokeless tobacco: Never  Vaping Use   Vaping Use: Never used  Substance and Sexual Activity   Alcohol use: No   Drug use: No   Sexual activity: Yes    Birth control/protection: Surgical  Other Topics Concern   Not on file  Social History Narrative   Not on file   Social Determinants of Health   Financial Resource Strain: Not on file  Food Insecurity: Not on file  Transportation Needs: Not on file  Physical Activity: Not on file  Stress: Not on file  Social Connections: Not on file  Intimate Partner Violence: Not on file   Social History   Social History Narrative   Not on file     ROS: Negative.     PE: HEENT: Negative. Lungs: Clear. Cardio: RR.     Assessment/Plan:  Proceed with planned right breast breast biopsy.   Merrily [facility]  04/06/2022

## 2022-04-07 LAB — SURGICAL PATHOLOGY

## 2022-04-22 ENCOUNTER — Telehealth: Payer: Self-pay

## 2022-04-22 NOTE — Telephone Encounter (Signed)
Attempted to contact patient regarding completing a medicaid application. Left message on voicemail requesting a return call.  

## 2022-04-23 ENCOUNTER — Telehealth: Payer: Self-pay

## 2022-04-23 NOTE — Telephone Encounter (Signed)
[employer] [place] forms completed.

## 2023-04-01 ENCOUNTER — Other Ambulatory Visit: Payer: Self-pay | Admitting: Nurse Practitioner

## 2023-04-01 DIAGNOSIS — Z1231 Encounter for screening mammogram for malignant neoplasm of breast: Secondary | ICD-10-CM

## 2023-05-21 ENCOUNTER — Other Ambulatory Visit: Payer: Self-pay | Admitting: Nurse Practitioner

## 2023-05-21 DIAGNOSIS — R1900 Intra-abdominal and pelvic swelling, mass and lump, unspecified site: Secondary | ICD-10-CM

## 2023-06-15 ENCOUNTER — Ambulatory Visit: Payer: Medicaid Other | Attending: Otolaryngology

## 2023-06-15 ENCOUNTER — Encounter: Payer: Self-pay | Admitting: Obstetrics & Gynecology

## 2023-06-15 ENCOUNTER — Ambulatory Visit (INDEPENDENT_AMBULATORY_CARE_PROVIDER_SITE_OTHER): Payer: Medicaid Other | Admitting: Obstetrics & Gynecology

## 2023-06-15 VITALS — BP 105/71 | HR 84 | Ht 65.0 in | Wt 238.0 lb

## 2023-06-15 DIAGNOSIS — G4733 Obstructive sleep apnea (adult) (pediatric): Secondary | ICD-10-CM | POA: Diagnosis present

## 2023-06-15 DIAGNOSIS — N939 Abnormal uterine and vaginal bleeding, unspecified: Secondary | ICD-10-CM | POA: Diagnosis not present

## 2023-06-15 DIAGNOSIS — Z1231 Encounter for screening mammogram for malignant neoplasm of breast: Secondary | ICD-10-CM

## 2023-06-15 DIAGNOSIS — R5383 Other fatigue: Secondary | ICD-10-CM

## 2023-06-15 NOTE — Progress Notes (Signed)
GYNECOLOGY PROGRESS NOTE  Subjective:    Patient ID: Megan [name], female    DOB: 08-21-1971, 52 y.o.   MRN: [id number]  HPI  Patient is a 52 y.o. married G4P0 here as a new patient with the issue of AUB. She had menarche around age 52. For the last 6 months or so, she has about 2 periods per month. She skipped her period entirely in August 2024. She has never gone more than a few months without a period. She had a pelvic ultrasound at [facility] recently. The results are as follows:   UTERUS/CERVIX: The uterus was heterogeneous and anteverted, measuring 8.4 x 6.5 x 7.4 cm. Multiple fibroids visualized, predominantly intramural/subserosal, measuring up to 4.8 x 4.2 x 5.4 cm anteriorly and 5.3 x 3.4 x 5.4 cm posteriorly. The endometrium was normal in thickness, measuring 0.70 cm. Nabothian cysts.   She feels some fatigue.  She had a BTL in the distant past.   She gets her pap smears through [facility] and denies a h/o abnormal paps. Her last one was in 2022.  The following portions of the patient's history were reviewed and updated as appropriate: allergies, current medications, past family history, past medical history, past social history, past surgical history, and problem list.  Review of Systems Pertinent items are noted in HPI.   Objective:   Blood pressure 105/71, pulse 84, height 5\' 5"  (1.651 m), weight 238 lb (108 kg), last menstrual period 05/20/2023. Body mass index is 39.61 kg/m. Well nourished, well hydrated Black female, no apparent distress She is ambulating and conversing normally. Graves speculum used to visualize her cervix. I saw no abnormalities.   Assessment:  AUB- trial of cyclic provera We also discussed possible endometrial ablation Her bleeding is likely due to her fibroids  2. Fatigue- check CBC and thyroid studies  Plan:   She will follow up in 4 months/prn sooner   Mammogram ordered

## 2023-06-16 LAB — CBC
Hematocrit: 27.1 % — ABNORMAL LOW (ref 34.0–46.6)
Hemoglobin: 7.9 g/dL — ABNORMAL LOW (ref 11.1–15.9)
MCH: 21.5 pg — ABNORMAL LOW (ref 26.6–33.0)
MCHC: 29.2 g/dL — ABNORMAL LOW (ref 31.5–35.7)
MCV: 74 fL — ABNORMAL LOW (ref 79–97)
Platelets: 265 10*3/uL (ref 150–450)
RBC: 3.68 x10E6/uL — ABNORMAL LOW (ref 3.77–5.28)
RDW: 16.6 % — ABNORMAL HIGH (ref 11.7–15.4)
WBC: 7.2 10*3/uL (ref 3.4–10.8)

## 2023-06-16 LAB — TSH+FREE T4
Free T4: 1.1 ng/dL (ref 0.82–1.77)
TSH: 1.48 u[IU]/mL (ref 0.450–4.500)

## 2023-06-21 ENCOUNTER — Other Ambulatory Visit: Payer: Self-pay | Admitting: Obstetrics & Gynecology

## 2023-06-21 ENCOUNTER — Encounter: Payer: Self-pay | Admitting: Obstetrics & Gynecology

## 2023-06-21 DIAGNOSIS — D500 Iron deficiency anemia secondary to blood loss (chronic): Secondary | ICD-10-CM

## 2023-06-21 NOTE — Progress Notes (Unsigned)
Referral to heme onc for IV iron infusion for HBG of 7.0

## 2023-06-22 ENCOUNTER — Encounter: Payer: Self-pay | Admitting: Oncology

## 2023-06-22 ENCOUNTER — Inpatient Hospital Stay: Payer: Medicaid Other

## 2023-06-22 ENCOUNTER — Inpatient Hospital Stay: Payer: Medicaid Other | Attending: Oncology | Admitting: Oncology

## 2023-06-22 VITALS — BP 122/83 | HR 97 | Temp 99.5°F | Wt 242.0 lb

## 2023-06-22 DIAGNOSIS — Z8041 Family history of malignant neoplasm of ovary: Secondary | ICD-10-CM | POA: Diagnosis not present

## 2023-06-22 DIAGNOSIS — D509 Iron deficiency anemia, unspecified: Secondary | ICD-10-CM | POA: Diagnosis not present

## 2023-06-22 DIAGNOSIS — Z800 Family history of malignant neoplasm of digestive organs: Secondary | ICD-10-CM | POA: Diagnosis not present

## 2023-06-22 DIAGNOSIS — Z803 Family history of malignant neoplasm of breast: Secondary | ICD-10-CM | POA: Insufficient documentation

## 2023-06-22 LAB — CBC WITH DIFFERENTIAL/PLATELET
Abs Immature Granulocytes: 0.04 10*3/uL (ref 0.00–0.07)
Basophils Absolute: 0.1 10*3/uL (ref 0.0–0.1)
Basophils Relative: 1 %
Eosinophils Absolute: 0.3 10*3/uL (ref 0.0–0.5)
Eosinophils Relative: 4 %
HCT: 26.2 % — ABNORMAL LOW (ref 36.0–46.0)
Hemoglobin: 7.8 g/dL — ABNORMAL LOW (ref 12.0–15.0)
Immature Granulocytes: 1 %
Lymphocytes Relative: 31 %
Lymphs Abs: 2.7 10*3/uL (ref 0.7–4.0)
MCH: 21.8 pg — ABNORMAL LOW (ref 26.0–34.0)
MCHC: 29.8 g/dL — ABNORMAL LOW (ref 30.0–36.0)
MCV: 73.4 fL — ABNORMAL LOW (ref 80.0–100.0)
Monocytes Absolute: 0.7 10*3/uL (ref 0.1–1.0)
Monocytes Relative: 8 %
Neutro Abs: 4.9 10*3/uL (ref 1.7–7.7)
Neutrophils Relative %: 55 %
Platelets: 323 10*3/uL (ref 150–400)
RBC: 3.57 MIL/uL — ABNORMAL LOW (ref 3.87–5.11)
RDW: 17.8 % — ABNORMAL HIGH (ref 11.5–15.5)
WBC: 8.8 10*3/uL (ref 4.0–10.5)
nRBC: 0 % (ref 0.0–0.2)

## 2023-06-22 LAB — RETICULOCYTES
Immature Retic Fract: 29.6 % — ABNORMAL HIGH (ref 2.3–15.9)
RBC.: 3.57 MIL/uL — ABNORMAL LOW (ref 3.87–5.11)
Retic Count, Absolute: 57.8 10*3/uL (ref 19.0–186.0)
Retic Ct Pct: 1.6 % (ref 0.4–3.1)

## 2023-06-22 LAB — IRON AND TIBC
Iron: 23 ug/dL — ABNORMAL LOW (ref 28–170)
Saturation Ratios: 5 % — ABNORMAL LOW (ref 10.4–31.8)
TIBC: 500 ug/dL — ABNORMAL HIGH (ref 250–450)
UIBC: 477 ug/dL

## 2023-06-22 LAB — FERRITIN: Ferritin: 5 ng/mL — ABNORMAL LOW (ref 11–307)

## 2023-06-22 LAB — VITAMIN B12: Vitamin B-12: 253 pg/mL (ref 180–914)

## 2023-06-22 LAB — FOLATE: Folate: 7.7 ng/mL (ref >5.9)

## 2023-06-22 NOTE — Progress Notes (Signed)
Hematology/Oncology Consult note [facility] [phone] [phone]  Patient Care Team: [facility], [facility] as PCP - General (General Practice) Jim Like, RN as Registered Nurse Scarlett Presto, RN (Inactive) as Registered Nurse Lemar Livings Merrily Pew, MD (General Surgery)   Name of the patient: Megan [name]  [id number]  1971-01-02    Reason for referral-iron deficiency anemia   Referring physician-Myra Marice Potter, MD  Date of visit: 06/22/23   History of presenting illness-patient is a 52 year old female with a past medical history significantFor thyroidism, obesity, HIV among other medical problems.  She has been referred for iron deficiency anemia.  CBC from 06/15/2023 showed H&H of 7.9/27.1 with an MCV of 74 with a normal white count and platelet count.  TSH was normal.  Patient is perimenopausal and does get her menstrual cycles which sometimes last over a week.  Denies any consistent use of NSAIDs.  Denies any family history of colon cancer.  She has never had a colonoscopy.  Denies any blood loss in her stool or urine.  ECOG PS- 1  Pain scale- 0   Review of systems- Review of Systems  Constitutional:  Positive for malaise/fatigue. Negative for chills, fever and weight loss.  HENT:  Negative for congestion, ear discharge and nosebleeds.   Eyes:  Negative for blurred vision.  Respiratory:  Negative for cough, hemoptysis, sputum production, shortness of breath and wheezing.   Cardiovascular:  Negative for chest pain, palpitations, orthopnea and claudication.  Gastrointestinal:  Negative for abdominal pain, blood in stool, constipation, diarrhea, heartburn, melena, nausea and vomiting.  Genitourinary:  Negative for dysuria, flank pain, frequency, hematuria and urgency.  Musculoskeletal:  Negative for back pain, joint pain and myalgias.  Skin:  Negative for rash.  Neurological:  Negative for dizziness, tingling,  focal weakness, seizures, weakness and headaches.  Endo/Heme/Allergies:  Does not bruise/bleed easily.  Psychiatric/Behavioral:  Negative for depression and suicidal ideas. The patient does not have insomnia.     No Known Allergies  Patient Active Problem List   Diagnosis Date Noted   Herpes zoster dermatitis with ophthalmic complication 06/11/2021   Shingles 06/11/2021   Herpes zoster ophthalmicus of left eye    LFT elevation    Hypothyroidism    Morbid obesity (HCC)    Asthma    COVID-19 virus infection 08/2020   Nipple discharge 12/03/2015   Abnormal mammogram of left breast 12/03/2015     Past Medical History:  Diagnosis Date   Anxiety    Asthma    COVID-19 virus infection 08/2020   Hypothyroidism    Morbid obesity (HCC)      Past Surgical History:  Procedure Laterality Date   BREAST BIOPSY Right 11/11/2020   [place] bx, vision marker, - FRAGMENTS OF BENIGN INTRAMAMMARY LYMPH NODE.  NEGATIVE FOR ATYPICAL PROLIFERATIVE BREAST DISEASE.   BREAST BIOPSY Left 11/11/2020`   [place] bx, Q shape, FIBROEPITHELIAL PROLIFERATION WITH SCLEROSIS. NEGATIVE FOR ATYPICAL PROLIFERATIVE BREAST DISEASE IN THIS SAMPLE.    BREAST BIOPSY Left 12/03/2020   [place] bx, vision clip, fiboadenoma   BREAST BIOPSY Left 12/03/2020   [place] bx, x-clip, fibroadenoma   BREAST BIOPSY Left 02/03/2021   Procedure: BREAST BIOPSY WITH NEEDLE LOCALIZATION;  Surgeon: [name], MD;  Location: [facility] ORS;  Service: General;  Laterality: Left;   BREAST BIOPSY Right 01/21/2022   [place] core bx right breast ribbon clip path pending   BREAST BIOPSY Right 04/06/2022   Procedure: BREAST BIOPSY;  Surgeon: Donnalee Curry  W, MD;  Location: [facility] ORS;  Service: General;  Laterality: Right;   BREAST LUMPECTOMY Left 02/03/2021   LT 2:00 X clip NL done prior to lumpectomy    CESAREAN SECTION     FOOT SURGERY     bone spur   TUBAL LIGATION      Social History   Socioeconomic History   Marital status: Single    Spouse name:  Not on file   Number of children: Not on file   Years of education: Not on file   Highest education level: Not on file  Occupational History   Not on file  Tobacco Use   Smoking status: Never   Smokeless tobacco: Never  Vaping Use   Vaping status: Never Used  Substance and Sexual Activity   Alcohol use: No   Drug use: No   Sexual activity: Yes    Birth control/protection: Surgical  Other Topics Concern   Not on file  Social History Narrative   Not on file   Social Determinants of Health   Financial Resource Strain: Not on file  Food Insecurity: Not on file  Transportation Needs: Not on file  Physical Activity: Not on file  Stress: Not on file  Social Connections: Not on file  Intimate Partner Violence: Not on file     Family History  Problem Relation Age of Onset   Thyroid disease Mother    Hypertension Mother    Diabetes Mother    Rheum arthritis Mother    Breast cancer Maternal Grandmother        great grandmother   Cancer Paternal Grandmother        Pancreatic   Ovarian cancer Other 31     Current Outpatient Medications:    Albuterol Sulfate, sensor, (PROAIR DIGIHALER) 108 (90 Base) MCG/ACT AEPB, Inhale 2 puffs into the lungs every 4 (four) hours., Disp: , Rfl:    ascorbic acid (VITAMIN C) 500 MG tablet, Take 1 tablet (500 mg total) by mouth daily., Disp: 30 tablet, Rfl: 0   BIKTARVY 50-200-25 MG TABS tablet, Take 1 tablet by mouth daily., Disp: , Rfl:    dexamethasone (DECADRON) 0.1 % ophthalmic suspension, Place 2 drops into both eyes every 4 (four) hours., Disp: 15 mL, Rfl: 0   diclofenac (VOLTAREN) 75 MG EC tablet, Take 75 mg by mouth 2 (two) times daily as needed., Disp: , Rfl:    erythromycin ophthalmic ointment, Place into the left eye every 8 (eight) hours., Disp: 3.5 g, Rfl: 0   hydrocortisone 2.5 % cream, Apply 1 Application topically once as needed., Disp: , Rfl:    iron polysaccharides (NIFEREX) 150 MG capsule, Take 1 capsule (150 mg total) by  mouth daily., Disp: 30 capsule, Rfl: 0   levothyroxine (SYNTHROID) 88 MCG tablet, Take 88 mcg by mouth daily before breakfast., Disp: , Rfl:    loratadine (CLARITIN) 10 MG tablet, Take 10 mg by mouth daily as needed for allergies., Disp: , Rfl:    PARoxetine (PAXIL) 20 MG tablet, Take 20 mg by mouth daily., Disp: , Rfl:    phenol (CHLORASEPTIC) 1.4 % LIQD, Use as directed 1 spray in the mouth or throat as needed for throat irritation / pain., Disp: 118 mL, Rfl: 0   pregabalin (LYRICA) 100 MG capsule, Take 100 mg by mouth 3 (three) times daily., Disp: , Rfl:    traZODone (DESYREL) 50 MG tablet, Take 50 mg by mouth at bedtime as needed for sleep., Disp: , Rfl:  WEGOVY 1 MG/0.5ML SOAJ, Inject into the skin., Disp: , Rfl:    Ascorbic Acid 500 MG CHEW, Chew 1 tablet by mouth daily. (Patient not taking: Reported on 06/22/2023), Disp: , Rfl:    capsaicin (ZOSTRIX) 0.025 % cream, , Disp: , Rfl:    docusate (COLACE) 50 MG/5ML liquid, Take 10 mLs (100 mg total) by mouth 2 (two) times daily as needed for mild constipation. (Patient not taking: Reported on 06/22/2023), Disp: 100 mL, Rfl: 0   feeding supplement (ENSURE ENLIVE / ENSURE PLUS) LIQD, Take 237 mLs by mouth 3 (three) times daily between meals. (Patient not taking: Reported on 06/22/2023), Disp: 237 mL, Rfl: 12   vitamin B-12 (CYANOCOBALAMIN) 500 MCG tablet, Take 1 tablet (500 mcg total) by mouth daily. (Patient not taking: Reported on 06/22/2023), Disp: 30 tablet, Rfl: 0   VITAMIN D PO, Take 1 tablet by mouth daily. (Patient not taking: Reported on 06/22/2023), Disp: , Rfl:    Vitamin D, Ergocalciferol, (DRISDOL) 1.25 MG (50000 UNIT) CAPS capsule, Take 1 capsule (50,000 Units total) by mouth every 7 (seven) days. (Patient not taking: Reported on 06/22/2023), Disp: 5 capsule, Rfl: 0   Physical exam:  Vitals:   06/22/23 1454  BP: 122/83  Pulse: 97  Temp: 99.5 F (37.5 C)  TempSrc: Tympanic  SpO2: 99%  Weight: 242 lb (109.8 kg)   Physical  Exam Cardiovascular:     Rate and Rhythm: Normal rate and regular rhythm.     Heart sounds: Normal heart sounds.  Pulmonary:     Effort: Pulmonary effort is normal.     Breath sounds: Normal breath sounds.  Abdominal:     General: Bowel sounds are normal.     Palpations: Abdomen is soft.  Skin:    General: Skin is warm and dry.  Neurological:     Mental Status: She is alert and oriented to person, place, and time.           Latest Ref Rng & Units 06/18/2021    4:56 AM  CMP  Glucose 70 - 99 mg/dL 161   BUN 6 - 20 mg/dL 18   Creatinine 0.96 - 1.00 mg/dL 0.45   Sodium 409 - 811 mmol/L 135   Potassium 3.5 - 5.1 mmol/L 3.8   Chloride 98 - 111 mmol/L 105   CO2 22 - 32 mmol/L 22   Calcium 8.9 - 10.3 mg/dL 9.2       Latest Ref Rng & Units 06/15/2023    3:15 PM  CBC  WBC 3.4 - 10.8 x10E3/uL 7.2   Hemoglobin 11.1 - 15.9 g/dL 7.9   Hematocrit 91.4 - 46.6 % 27.1   Platelets 150 - 450 x10E3/uL 265     No images are attached to the encounter.  No results found.  Assessment and plan- Patient is a 52 y.o. female referred for microcytic anemia  Baseline hemoglobin has been between 9-10 even 2 years ago.  It has drifted down to 7.9 presently with an MCV of 74.  White count and platelets are normal.  TSH was normal.  I suspect she has iron deficiency.  I will proceed with CBC ferritin and iron studies B12 folate and reticulocyte count today.  After it is confirmed that she has iron deficiency we will proceed with IV iron.  Discussed risks and benefits of IV iron including all but not limited to possible risk of infusion reaction.  Patient understands and agrees to proceed as planned.  With regards to etiology of  her iron deficiency anemia it may be secondary to menorrhagia but I would recommend that she should see GI as well given the fact that she has never had a colonoscopy and she is 52 years of age  CBC ferritin and iron studies in 2 months and see Dr.Marilyne Haseley  Thank you for this kind  referral and the opportunity to participate in the care of this patient   Visit Diagnosis 1. Microcytic anemia     Dr. Owens Shark, MD, MPH [facility] at [facility] [phone] 06/22/2023

## 2023-06-23 ENCOUNTER — Encounter: Payer: Self-pay | Admitting: Oncology

## 2023-06-24 ENCOUNTER — Inpatient Hospital Stay: Payer: Medicaid Other

## 2023-06-24 VITALS — BP 102/60 | HR 85 | Temp 98.4°F | Resp 16

## 2023-06-24 DIAGNOSIS — D509 Iron deficiency anemia, unspecified: Secondary | ICD-10-CM

## 2023-06-24 MED ORDER — SODIUM CHLORIDE 0.9 % IV SOLN
1000.0000 mg | Freq: Once | INTRAVENOUS | Status: AC
  Administered 2023-06-24: 1000 mg via INTRAVENOUS
  Filled 2023-06-24: qty 10

## 2023-06-24 MED ORDER — SODIUM CHLORIDE 0.9 % IV SOLN
INTRAVENOUS | Status: DC
  Filled 2023-06-24: qty 250

## 2023-06-24 NOTE — Patient Instructions (Signed)

## 2023-08-10 ENCOUNTER — Encounter: Payer: Self-pay | Admitting: Gastroenterology

## 2023-08-10 ENCOUNTER — Ambulatory Visit (INDEPENDENT_AMBULATORY_CARE_PROVIDER_SITE_OTHER): Payer: Medicaid Other | Admitting: Gastroenterology

## 2023-08-10 VITALS — BP 111/78 | HR 81 | Temp 98.7°F | Ht 65.0 in | Wt 234.0 lb

## 2023-08-10 DIAGNOSIS — D509 Iron deficiency anemia, unspecified: Secondary | ICD-10-CM | POA: Diagnosis not present

## 2023-08-10 NOTE — Progress Notes (Signed)
Gastroenterology Consultation  Referring Provider:     Center, Darcella Gasman* Primary Care Physician:  Center, [facility] Primary Gastroenterologist:  Dr. Servando Snare     Reason for Consultation:     Iron deficiency anemia        HPI:   Megan [name] is a 52 y.o. y/o female referred for consultation & management of iron deficiency anemia by Dr. Eli Phillips, [facility].  This patient comes in today with a finding of iron deficiency anemia.  The patient's labs have shown:  Component     Latest Ref Rng 06/18/2021 06/15/2023 06/22/2023  Hemoglobin     12.0 - 15.0 g/dL 9.9 (L)  7.9 (L)  7.8 (L)   HCT     36.0 - 46.0 % 32.4 (L)  27.1 (L)  26.2 (L)   MCV     80.0 - 100.0 fL 74.8 (L)  74 (L)  73.4 (L)    Component     Latest Ref Rng 06/12/2021 06/22/2023  Iron     28 - 170 ug/dL 30  23 (L)   TIBC     161 - 450 ug/dL 096 (H)  045 (H)   Saturation Ratios     10.4 - 31.8 % 6 (L)  5 (L)   UIBC     ug/dL 409  811    Rectal bleeding: Denies Nose bleeds: Denies Vaginal bleeding : She reports that she does have heavy periods Hematemesis or hemoptysis : Denies Blood in urine : Denies  The patient does report that she has been told she has had iron deficiency for some time.  Past Medical History:  Diagnosis Date   Anxiety    Asthma    COVID-19 virus infection 08/2020   Hypothyroidism    Morbid obesity (HCC)     Past Surgical History:  Procedure Laterality Date   BREAST BIOPSY Right 11/11/2020   [place] bx, vision marker, - FRAGMENTS OF BENIGN INTRAMAMMARY LYMPH NODE.  NEGATIVE FOR ATYPICAL PROLIFERATIVE BREAST DISEASE.   BREAST BIOPSY Left 11/11/2020`   [place] bx, Q shape, FIBROEPITHELIAL PROLIFERATION WITH SCLEROSIS. NEGATIVE FOR ATYPICAL PROLIFERATIVE BREAST DISEASE IN THIS SAMPLE.    BREAST BIOPSY Left 12/03/2020   [place] bx, vision clip, fiboadenoma   BREAST BIOPSY Left 12/03/2020   [place] bx, x-clip, fibroadenoma   BREAST BIOPSY Left 02/03/2021    Procedure: BREAST BIOPSY WITH NEEDLE LOCALIZATION;  Surgeon: [name], MD;  Location: [facility] ORS;  Service: General;  Laterality: Left;   BREAST BIOPSY Right 01/21/2022   [place] core bx right breast ribbon clip path pending   BREAST BIOPSY Right 04/06/2022   Procedure: BREAST BIOPSY;  Surgeon: [name], MD;  Location: [facility] ORS;  Service: General;  Laterality: Right;   BREAST LUMPECTOMY Left 02/03/2021   LT 2:00 X clip NL done prior to lumpectomy    CESAREAN SECTION     FOOT SURGERY     bone spur   TUBAL LIGATION      Prior to Admission medications   Medication Sig Start Date End Date Taking? Authorizing Provider  Albuterol Sulfate, sensor, (PROAIR DIGIHALER) 108 (90 Base) MCG/ACT AEPB Inhale 2 puffs into the lungs every 4 (four) hours.    [provider]  ascorbic acid (VITAMIN C) 500 MG tablet Take 1 tablet (500 mg total) by mouth daily. 06/19/21   Marguerita Merles Latif, DO  Ascorbic Acid 500 MG CHEW Chew 1 tablet by mouth daily. Patient not  taking: Reported on 06/22/2023 06/19/21   [provider]  BIKTARVY 50-200-25 MG TABS tablet Take 1 tablet by mouth daily. 11/27/21   [provider]  capsaicin (ZOSTRIX) 0.025 % cream  11/06/21   [provider]  dexamethasone (DECADRON) 0.1 % ophthalmic suspension Place 2 drops into both eyes every 4 (four) hours. 06/18/21   Marguerita Merles Latif, DO  diclofenac (VOLTAREN) 75 MG EC tablet Take 75 mg by mouth 2 (two) times daily as needed. 07/28/21   [provider]  docusate (COLACE) 50 MG/5ML liquid Take 10 mLs (100 mg total) by mouth 2 (two) times daily as needed for mild constipation. Patient not taking: Reported on 06/22/2023 06/18/21   Marguerita Merles Latif, DO  erythromycin ophthalmic ointment Place into the left eye every 8 (eight) hours. 06/18/21   Marguerita Merles Latif, DO  feeding supplement (ENSURE ENLIVE / ENSURE PLUS) LIQD Take 237 mLs by mouth 3 (three) times daily between meals. Patient  not taking: Reported on 06/22/2023 06/18/21   Marguerita Merles Latif, DO  hydrocortisone 2.5 % cream Apply 1 Application topically once as needed. 10/22/21   [provider]  iron polysaccharides (NIFEREX) 150 MG capsule Take 1 capsule (150 mg total) by mouth daily. 06/19/21   Marguerita Merles Latif, DO  levothyroxine (SYNTHROID) 88 MCG tablet Take 88 mcg by mouth daily before breakfast. 05/08/21   [provider]  loratadine (CLARITIN) 10 MG tablet Take 10 mg by mouth daily as needed for allergies.    [provider]  PARoxetine (PAXIL) 20 MG tablet Take 20 mg by mouth daily.    [provider]  phenol (CHLORASEPTIC) 1.4 % LIQD Use as directed 1 spray in the mouth or throat as needed for throat irritation / pain. 06/18/21   Sheikh, Kateri Mc Latif, DO  pregabalin (LYRICA) 100 MG capsule Take 100 mg by mouth 3 (three) times daily. 06/10/23   [provider]  traZODone (DESYREL) 50 MG tablet Take 50 mg by mouth at bedtime as needed for sleep.    [provider]  vitamin B-12 (CYANOCOBALAMIN) 500 MCG tablet Take 1 tablet (500 mcg total) by mouth daily. Patient not taking: Reported on 06/22/2023 06/19/21   Marguerita Merles Latif, DO  VITAMIN D PO Take 1 tablet by mouth daily. Patient not taking: Reported on 06/22/2023    [provider]  Vitamin D, Ergocalciferol, (DRISDOL) 1.25 MG (50000 UNIT) CAPS capsule Take 1 capsule (50,000 Units total) by mouth every 7 (seven) days. Patient not taking: Reported on 06/22/2023 06/20/21   Marguerita Merles Latif, DO  WEGOVY 1 MG/0.5ML SOAJ Inject into the skin. 04/26/23   [provider]    Family History  Problem Relation Age of Onset   Thyroid disease Mother    Hypertension Mother    Diabetes Mother    Rheum arthritis Mother    Breast cancer Maternal Grandmother        great grandmother   Cancer Paternal Grandmother        Pancreatic   Ovarian cancer Other 67     Social History   Tobacco Use    Smoking status: Never   Smokeless tobacco: Never  Vaping Use   Vaping status: Never Used  Substance Use Topics   Alcohol use: No   Drug use: No    Allergies as of 08/10/2023   (No Known Allergies)    Review of Systems:    All systems reviewed and negative except where noted in HPI.  Physical Exam:  There were no vitals taken for this visit. No LMP recorded. General:   Alert,  Well-developed, well-nourished, pleasant and cooperative in NAD Head:  Normocephalic and atraumatic. Eyes:  Sclera clear, no icterus.   Conjunctiva pink. Ears:  Normal auditory acuity. Rectal:  Deferred.  Neurologic:  Alert and oriented x3;  grossly normal neurologically. Psych:  Alert and cooperative. Normal mood and affect.  Imaging Studies: No results found.  Assessment and Plan:   Megan [name] is a 52 y.o. y/o female who comes in with iron deficient anemia.  The patient does report heavy menstrual periods but denies any sign of GI bleeding.  The patient has not had any GI evaluation in the past and has never had a colonoscopy or an upper endoscopy.  The patient will be set up for an EGD and colonoscopy to evaluate her GI tract for possible cause of the iron deficient anemia.  The patient has also been told that if this is negative the patient may need to be set up for a capsule endoscopy.  The patient has been explained the plan and agrees with it.    [name], MD. Clementeen Graham    Note: This dictation was prepared with Dragon dictation along with smaller phrase technology. Any transcriptional errors that result from this process are unintentional.

## 2023-08-23 ENCOUNTER — Inpatient Hospital Stay: Payer: Medicaid Other | Attending: Oncology

## 2023-08-23 ENCOUNTER — Encounter: Payer: Self-pay | Admitting: Oncology

## 2023-08-23 ENCOUNTER — Other Ambulatory Visit: Payer: Self-pay

## 2023-08-23 ENCOUNTER — Inpatient Hospital Stay (HOSPITAL_BASED_OUTPATIENT_CLINIC_OR_DEPARTMENT_OTHER): Payer: Medicaid Other | Admitting: Oncology

## 2023-08-23 VITALS — BP 126/85 | HR 86 | Temp 97.5°F | Resp 18 | Wt 257.0 lb

## 2023-08-23 DIAGNOSIS — D509 Iron deficiency anemia, unspecified: Secondary | ICD-10-CM | POA: Insufficient documentation

## 2023-08-23 DIAGNOSIS — Z8041 Family history of malignant neoplasm of ovary: Secondary | ICD-10-CM | POA: Insufficient documentation

## 2023-08-23 DIAGNOSIS — Z803 Family history of malignant neoplasm of breast: Secondary | ICD-10-CM | POA: Insufficient documentation

## 2023-08-23 DIAGNOSIS — Z800 Family history of malignant neoplasm of digestive organs: Secondary | ICD-10-CM | POA: Insufficient documentation

## 2023-08-23 LAB — CBC WITH DIFFERENTIAL (CANCER CENTER ONLY)
Abs Immature Granulocytes: 0.01 10*3/uL (ref 0.00–0.07)
Basophils Absolute: 0 10*3/uL (ref 0.0–0.1)
Basophils Relative: 1 %
Eosinophils Absolute: 0.1 10*3/uL (ref 0.0–0.5)
Eosinophils Relative: 2 %
HCT: 36.1 % (ref 36.0–46.0)
Hemoglobin: 11.6 g/dL — ABNORMAL LOW (ref 12.0–15.0)
Immature Granulocytes: 0 %
Lymphocytes Relative: 38 %
Lymphs Abs: 2 10*3/uL (ref 0.7–4.0)
MCH: 26.7 pg (ref 26.0–34.0)
MCHC: 32.1 g/dL (ref 30.0–36.0)
MCV: 83.2 fL (ref 80.0–100.0)
Monocytes Absolute: 0.5 10*3/uL (ref 0.1–1.0)
Monocytes Relative: 9 %
Neutro Abs: 2.6 10*3/uL (ref 1.7–7.7)
Neutrophils Relative %: 50 %
Platelet Count: 267 10*3/uL (ref 150–400)
RBC: 4.34 MIL/uL (ref 3.87–5.11)
RDW: 21.7 % — ABNORMAL HIGH (ref 11.5–15.5)
WBC Count: 5.2 10*3/uL (ref 4.0–10.5)
nRBC: 0 % (ref 0.0–0.2)

## 2023-08-23 LAB — IRON AND TIBC
Iron: 71 ug/dL (ref 28–170)
Saturation Ratios: 18 % (ref 10.4–31.8)
TIBC: 403 ug/dL (ref 250–450)
UIBC: 332 ug/dL

## 2023-08-23 LAB — FERRITIN: Ferritin: 12 ng/mL (ref 11–307)

## 2023-08-23 NOTE — Progress Notes (Signed)
Hematology/Oncology Consult note [facility]  [phone] [phone]  Patient Care Team: Center, [facility] as PCP - General (General Practice) Jim Like, RN as Registered Nurse Scarlett Presto, RN (Inactive) as Registered Nurse Lemar Livings Merrily Pew, MD (General Surgery)   Name of the patient: Megan [name]  [id number]  1971-05-29   Date of visit: 08/23/23  Diagnosis-iron deficiency anemia  Chief complaint/ Reason for visit-routine follow-up of iron deficiency anemia  Heme/Onc history: patient is a 52 year old female with a past medical history significantFor thyroidism, obesity, HIV among other medical problems.  She has been referred for iron deficiency anemia.  CBC from 06/15/2023 showed H&H of 7.9/27.1 with an MCV of 74 with a normal white count and platelet count.  TSH was normal.  Patient is perimenopausal and does get her menstrual cycles which sometimes last over a week.  Denies any consistent use of NSAIDs.  Denies any family history of colon cancer.  She has never had a colonoscopy.  Denies any blood loss in her stool or urine.  Patient received IV iron in November 2024.  She has also been seen by GI and will be undergoing endoscopy evaluation in January 2025.  Interval history-overall energy levels have improved after receiving IV iron but still not back to baseline.  Menstrual cycles are irregular and sometimes they can last for over a week.  ECOG PS- 1 Pain scale- 0   Review of systems- Review of Systems  Constitutional:  Positive for malaise/fatigue. Negative for chills, fever and weight loss.  HENT:  Negative for congestion, ear discharge and nosebleeds.   Eyes:  Negative for blurred vision.  Respiratory:  Negative for cough, hemoptysis, sputum production, shortness of breath and wheezing.   Cardiovascular:  Negative for chest pain, palpitations, orthopnea and claudication.  Gastrointestinal:   Negative for abdominal pain, blood in stool, constipation, diarrhea, heartburn, melena, nausea and vomiting.  Genitourinary:  Negative for dysuria, flank pain, frequency, hematuria and urgency.  Musculoskeletal:  Negative for back pain, joint pain and myalgias.  Skin:  Negative for rash.  Neurological:  Negative for dizziness, tingling, focal weakness, seizures, weakness and headaches.  Endo/Heme/Allergies:  Does not bruise/bleed easily.  Psychiatric/Behavioral:  Negative for depression and suicidal ideas. The patient does not have insomnia.       No Known Allergies   Past Medical History:  Diagnosis Date   Anxiety    Asthma    COVID-19 virus infection 08/2020   Hypothyroidism    Morbid obesity (HCC)      Past Surgical History:  Procedure Laterality Date   BREAST BIOPSY Right 11/11/2020   [place] bx, vision marker, - FRAGMENTS OF BENIGN INTRAMAMMARY LYMPH NODE.  NEGATIVE FOR ATYPICAL PROLIFERATIVE BREAST DISEASE.   BREAST BIOPSY Left 11/11/2020`   [place] bx, Q shape, FIBROEPITHELIAL PROLIFERATION WITH SCLEROSIS. NEGATIVE FOR ATYPICAL PROLIFERATIVE BREAST DISEASE IN THIS SAMPLE.    BREAST BIOPSY Left 12/03/2020   [place] bx, vision clip, fiboadenoma   BREAST BIOPSY Left 12/03/2020   [place] bx, x-clip, fibroadenoma   BREAST BIOPSY Left 02/03/2021   Procedure: BREAST BIOPSY WITH NEEDLE LOCALIZATION;  Surgeon: [name], MD;  Location: [facility] ORS;  Service: General;  Laterality: Left;   BREAST BIOPSY Right 01/21/2022   [place] core bx right breast ribbon clip path pending   BREAST BIOPSY Right 04/06/2022   Procedure: BREAST BIOPSY;  Surgeon: [name], MD;  Location: [facility] ORS;  Service: General;  Laterality: Right;  BREAST LUMPECTOMY Left 02/03/2021   LT 2:00 X clip NL done prior to lumpectomy    CESAREAN SECTION     FOOT SURGERY     bone spur   TUBAL LIGATION      Social History   Socioeconomic History   Marital status: Single    Spouse name: Not on file   Number of children:  Not on file   Years of education: Not on file   Highest education level: Not on file  Occupational History   Not on file  Tobacco Use   Smoking status: Never   Smokeless tobacco: Never  Vaping Use   Vaping status: Never Used  Substance and Sexual Activity   Alcohol use: No   Drug use: No   Sexual activity: Yes    Birth control/protection: Surgical  Other Topics Concern   Not on file  Social History Narrative   Not on file   Social Drivers of Health   Financial Resource Strain: Not on file  Food Insecurity: Unknown (06/22/2023)   Hunger Vital Sign    Worried About Running Out of Food in the Last Year: Never true    Ran Out of Food in the Last Year: Not on file  Transportation Needs: Unknown (06/22/2023)   PRAPARE - Administrator, Civil Service (Medical): No    Lack of Transportation (Non-Medical): Not on file  Physical Activity: Not on file  Stress: Not on file  Social Connections: Not on file  Intimate Partner Violence: Unknown (06/22/2023)   Humiliation, Afraid, Rape, and Kick questionnaire    Fear of Current or Ex-Partner: No    Emotionally Abused: Not on file    Physically Abused: Not on file    Sexually Abused: Not on file    Family History  Problem Relation Age of Onset   Thyroid disease Mother    Hypertension Mother    Diabetes Mother    Rheum arthritis Mother    Breast cancer Maternal Grandmother        great grandmother   Cancer Paternal Grandmother        Pancreatic   Ovarian cancer Other 36     Current Outpatient Medications:    Albuterol Sulfate, sensor, (PROAIR DIGIHALER) 108 (90 Base) MCG/ACT AEPB, Inhale 2 puffs into the lungs every 4 (four) hours., Disp: , Rfl:    ascorbic acid (VITAMIN C) 500 MG tablet, Take 1 tablet (500 mg total) by mouth daily., Disp: 30 tablet, Rfl: 0   Ascorbic Acid 500 MG CHEW, Chew 1 tablet by mouth daily. (Patient not taking: Reported on 06/22/2023), Disp: , Rfl:    BIKTARVY 50-200-25 MG TABS tablet, Take  1 tablet by mouth daily., Disp: , Rfl:    capsaicin (ZOSTRIX) 0.025 % cream, , Disp: , Rfl:    dexamethasone (DECADRON) 0.1 % ophthalmic suspension, Place 2 drops into both eyes every 4 (four) hours., Disp: 15 mL, Rfl: 0   diclofenac (VOLTAREN) 75 MG EC tablet, Take 75 mg by mouth 2 (two) times daily as needed., Disp: , Rfl:    docusate (COLACE) 50 MG/5ML liquid, Take 10 mLs (100 mg total) by mouth 2 (two) times daily as needed for mild constipation. (Patient not taking: Reported on 06/22/2023), Disp: 100 mL, Rfl: 0   erythromycin ophthalmic ointment, Place into the left eye every 8 (eight) hours., Disp: 3.5 g, Rfl: 0   feeding supplement (ENSURE ENLIVE / ENSURE PLUS) LIQD, Take 237 mLs by mouth 3 (three) times daily  between meals. (Patient not taking: Reported on 06/22/2023), Disp: 237 mL, Rfl: 12   hydrocortisone 2.5 % cream, Apply 1 Application topically once as needed., Disp: , Rfl:    iron polysaccharides (NIFEREX) 150 MG capsule, Take 1 capsule (150 mg total) by mouth daily., Disp: 30 capsule, Rfl: 0   levothyroxine (SYNTHROID) 88 MCG tablet, Take 88 mcg by mouth daily before breakfast., Disp: , Rfl:    loratadine (CLARITIN) 10 MG tablet, Take 10 mg by mouth daily as needed for allergies., Disp: , Rfl:    PARoxetine (PAXIL) 20 MG tablet, Take 20 mg by mouth daily., Disp: , Rfl:    phenol (CHLORASEPTIC) 1.4 % LIQD, Use as directed 1 spray in the mouth or throat as needed for throat irritation / pain., Disp: 118 mL, Rfl: 0   pregabalin (LYRICA) 100 MG capsule, Take 100 mg by mouth 3 (three) times daily., Disp: , Rfl:    traZODone (DESYREL) 50 MG tablet, Take 50 mg by mouth at bedtime as needed for sleep., Disp: , Rfl:    vitamin B-12 (CYANOCOBALAMIN) 500 MCG tablet, Take 1 tablet (500 mcg total) by mouth daily. (Patient not taking: Reported on 06/22/2023), Disp: 30 tablet, Rfl: 0   VITAMIN D PO, Take 1 tablet by mouth daily. (Patient not taking: Reported on 06/22/2023), Disp: , Rfl:    Vitamin  D, Ergocalciferol, (DRISDOL) 1.25 MG (50000 UNIT) CAPS capsule, Take 1 capsule (50,000 Units total) by mouth every 7 (seven) days. (Patient not taking: Reported on 06/22/2023), Disp: 5 capsule, Rfl: 0   WEGOVY 1 MG/0.5ML SOAJ, Inject into the skin., Disp: , Rfl:   Physical exam:  Vitals:   08/23/23 1054  BP: 126/85  Pulse: 86  Resp: 18  Temp: (!) 97.5 F (36.4 C)  TempSrc: Tympanic  SpO2: 99%  Weight: 257 lb (116.6 kg)   Physical Exam Cardiovascular:     Rate and Rhythm: Normal rate and regular rhythm.     Heart sounds: Normal heart sounds.  Pulmonary:     Effort: Pulmonary effort is normal.  Skin:    General: Skin is warm and dry.  Neurological:     Mental Status: She is alert and oriented to person, place, and time.         Latest Ref Rng & Units 06/18/2021    4:56 AM  CMP  Glucose 70 - 99 mg/dL 027   BUN 6 - 20 mg/dL 18   Creatinine 2.53 - 1.00 mg/dL 6.64   Sodium 403 - 474 mmol/L 135   Potassium 3.5 - 5.1 mmol/L 3.8   Chloride 98 - 111 mmol/L 105   CO2 22 - 32 mmol/L 22   Calcium 8.9 - 10.3 mg/dL 9.2       Latest Ref Rng & Units 08/23/2023   10:24 AM  CBC  WBC 4.0 - 10.5 K/uL 5.2   Hemoglobin 12.0 - 15.0 g/dL 25.9   Hematocrit 56.3 - 46.0 % 36.1   Platelets 150 - 400 K/uL 267      Assessment and plan- Patient is a 52 y.o. female here for routine follow-up of iron deficiency anemia  Patient's hemoglobin is improved from 7.8 presently to 11.6.  Ferritin levels are still low at 12 with an iron saturation of 18%.  It would therefore be reasonable to offer her 1 more dose of Monoferric at this time I will repeat CBC ferritin and iron studies in 3 and 6 months and see her back in 6 months  Visit Diagnosis 1. Microcytic anemia   2. Iron deficiency anemia, unspecified iron deficiency anemia type      Dr. Owens Shark, MD, MPH [facility] [phone] 08/23/2023 12:47 PM

## 2023-08-26 ENCOUNTER — Telehealth: Payer: Self-pay | Admitting: Gastroenterology

## 2023-08-26 MED ORDER — NA SULFATE-K SULFATE-MG SULF 17.5-3.13-1.6 GM/177ML PO SOLN: 1.0000 | Freq: Once | ORAL | 0 refills | Status: AC

## 2023-08-26 NOTE — Telephone Encounter (Signed)
Transfer to [facility]

## 2023-08-26 NOTE — Telephone Encounter (Signed)
Pt wanted to move her appointment... Move procedure to 09/22/23... ppw mailed to pt

## 2023-09-06 ENCOUNTER — Inpatient Hospital Stay: Payer: Medicaid Other | Attending: Oncology

## 2023-09-06 VITALS — BP 118/82 | HR 79 | Temp 97.0°F | Resp 18

## 2023-09-06 DIAGNOSIS — Z79899 Other long term (current) drug therapy: Secondary | ICD-10-CM | POA: Diagnosis not present

## 2023-09-06 DIAGNOSIS — D509 Iron deficiency anemia, unspecified: Secondary | ICD-10-CM | POA: Insufficient documentation

## 2023-09-06 MED ORDER — ACETAMINOPHEN 325 MG PO TABS
650.0000 mg | ORAL_TABLET | Freq: Once | ORAL | Status: AC
Start: 2023-09-06 — End: 2023-09-06
  Administered 2023-09-06: 650 mg via ORAL

## 2023-09-06 MED ORDER — SODIUM CHLORIDE 0.9 % IV SOLN
1000.0000 mg | Freq: Once | INTRAVENOUS | Status: AC
  Administered 2023-09-06: 1000 mg via INTRAVENOUS
  Filled 2023-09-06: qty 10

## 2023-09-06 NOTE — Progress Notes (Signed)
 Patient here for monoferric  infusion. Pt reported no issues in the past with iron  infusions. After the monoferric  had finished infusing at about 1250, pt reported dizziness and headache. On call provider, Babara, MD, notified. Ordered one time dose of 650 mg of tylenol  and to continue observation. Tylenol  given at 1331. Pt was woken up at 1425 and reported her headache had slightly improved along with her dizziness. Pt wanted to leave due to another appointment scheduled for this afternoon. Pt educated to notify us  of any other issues or concerns. Pt verbalized understanding and was stable at discharge.

## 2023-09-15 ENCOUNTER — Encounter: Payer: Self-pay | Admitting: Gastroenterology

## 2023-09-22 ENCOUNTER — Encounter: Payer: Self-pay | Admitting: Gastroenterology

## 2023-09-22 ENCOUNTER — Ambulatory Visit: Payer: Medicaid Other | Admitting: Anesthesiology

## 2023-09-22 ENCOUNTER — Ambulatory Visit
Admission: RE | Admit: 2023-09-22 | Discharge: 2023-09-22 | Disposition: A | Discharge status: Home / Self Care | Payer: Medicaid Other | Attending: Gastroenterology | Admitting: Gastroenterology

## 2023-09-22 ENCOUNTER — Other Ambulatory Visit: Payer: Self-pay

## 2023-09-22 ENCOUNTER — Encounter
Admission: RE | Disposition: A | Discharge status: Home / Self Care | Payer: Self-pay | Source: Home / Self Care | Attending: Gastroenterology

## 2023-09-22 ENCOUNTER — Telehealth: Payer: Self-pay

## 2023-09-22 DIAGNOSIS — J45909 Unspecified asthma, uncomplicated: Secondary | ICD-10-CM | POA: Insufficient documentation

## 2023-09-22 DIAGNOSIS — D509 Iron deficiency anemia, unspecified: Secondary | ICD-10-CM | POA: Diagnosis not present

## 2023-09-22 DIAGNOSIS — E039 Hypothyroidism, unspecified: Secondary | ICD-10-CM | POA: Insufficient documentation

## 2023-09-22 DIAGNOSIS — G4730 Sleep apnea, unspecified: Secondary | ICD-10-CM | POA: Diagnosis not present

## 2023-09-22 DIAGNOSIS — Z79890 Hormone replacement therapy (postmenopausal): Secondary | ICD-10-CM | POA: Diagnosis not present

## 2023-09-22 DIAGNOSIS — Z79899 Other long term (current) drug therapy: Secondary | ICD-10-CM | POA: Insufficient documentation

## 2023-09-22 DIAGNOSIS — Z833 Family history of diabetes mellitus: Secondary | ICD-10-CM | POA: Insufficient documentation

## 2023-09-22 DIAGNOSIS — K640 First degree hemorrhoids: Secondary | ICD-10-CM | POA: Insufficient documentation

## 2023-09-22 DIAGNOSIS — E669 Obesity, unspecified: Secondary | ICD-10-CM | POA: Insufficient documentation

## 2023-09-22 DIAGNOSIS — Z6838 Body mass index (BMI) 38.0-38.9, adult: Secondary | ICD-10-CM | POA: Diagnosis not present

## 2023-09-22 DIAGNOSIS — I10 Essential (primary) hypertension: Secondary | ICD-10-CM | POA: Insufficient documentation

## 2023-09-22 DIAGNOSIS — K5730 Diverticulosis of large intestine without perforation or abscess without bleeding: Secondary | ICD-10-CM | POA: Diagnosis not present

## 2023-09-22 DIAGNOSIS — F419 Anxiety disorder, unspecified: Secondary | ICD-10-CM | POA: Insufficient documentation

## 2023-09-22 HISTORY — PX: COLONOSCOPY WITH PROPOFOL: SHX5780

## 2023-09-22 HISTORY — PX: ESOPHAGOGASTRODUODENOSCOPY (EGD) WITH PROPOFOL: SHX5813

## 2023-09-22 SURGERY — COLONOSCOPY WITH PROPOFOL
Anesthesia: General

## 2023-09-22 MED ORDER — LIDOCAINE HCL (CARDIAC) PF 100 MG/5ML IV SOSY
PREFILLED_SYRINGE | INTRAVENOUS | Status: DC | PRN
  Administered 2023-09-22: 50 mg via INTRAVENOUS

## 2023-09-22 MED ORDER — SODIUM CHLORIDE 0.9 % IV SOLN: INTRAVENOUS | Status: DC

## 2023-09-22 MED ORDER — PROPOFOL 10 MG/ML IV BOLUS
INTRAVENOUS | Status: DC | PRN
  Administered 2023-09-22: 150 ug/kg/min via INTRAVENOUS
  Administered 2023-09-22: 80 mg via INTRAVENOUS

## 2023-09-22 NOTE — Transfer of Care (Signed)
Immediate Anesthesia Transfer of Care Note  Patient: Megan [name]  Procedure(s) Performed: COLONOSCOPY WITH PROPOFOL ESOPHAGOGASTRODUODENOSCOPY (EGD) WITH PROPOFOL  Patient Location: Endoscopy Unit  Anesthesia Type:General  Level of Consciousness: sedated and drowsy  Airway & Oxygen Therapy: Patient Spontanous Breathing and Patient connected to nasal cannula oxygen  Post-op Assessment: Report given to RN and Post -op Vital signs reviewed and stable  Post vital signs: Reviewed and stable  Last Vitals:  Vitals Value Taken Time  BP 80/54 09/22/23 0928  Temp    Pulse 89 09/22/23 0929  Resp 17 09/22/23 0929  SpO2 93 % 09/22/23 0929  Vitals shown include unfiled device data.  Last Pain:  Vitals:   09/22/23 0928  TempSrc:   PainSc: Asleep         Complications: No notable events documented.

## 2023-09-22 NOTE — Telephone Encounter (Signed)
needs to set up for a capsule.

## 2023-09-22 NOTE — Anesthesia Preprocedure Evaluation (Signed)
Anesthesia Evaluation  Patient identified by MRN, date of birth, ID band Patient awake    Reviewed: Allergy & Precautions, NPO status , Patient's Chart, lab work & pertinent test results  History of Anesthesia Complications Negative for: history of anesthetic complications  Airway Mallampati: III  TM Distance: >3 FB Neck ROM: Full    Dental no notable dental hx. (+) Teeth Intact   Pulmonary asthma , sleep apnea and Continuous Positive Airway Pressure Ventilation , neg COPD, Patient abstained from smoking.Not current smoker   Pulmonary exam normal breath sounds clear to auscultation       Cardiovascular Exercise Tolerance: Good METS(-) hypertension(-) CAD and (-) Past MI negative cardio ROS (-) dysrhythmias  Rhythm:Regular Rate:Normal - Systolic murmurs    Neuro/Psych   Anxiety     negative neurological ROS  negative psych ROS   GI/Hepatic ,neg GERD  ,,(+)     (-) substance abuse    Endo/Other  neg diabetesHypothyroidism  Has not taken GLP1 agonist since Jan 7th  Renal/GU negative Renal ROS     Musculoskeletal   Abdominal  (+) + obese  Peds  Hematology   Anesthesia Other Findings Past Medical History: No date: Anxiety No date: Asthma 08/2020: COVID-19 virus infection No date: Hypothyroidism No date: Morbid obesity (HCC)  Reproductive/Obstetrics                             Anesthesia Physical Anesthesia Plan  ASA: 2  Anesthesia Plan: General   Post-op Pain Management: Minimal or no pain anticipated   Induction: Intravenous  PONV Risk Score and Plan: 3 and Propofol infusion, TIVA and Ondansetron  Airway Management Planned: Nasal Cannula  Additional Equipment: None  Intra-op Plan:   Post-operative Plan:   Informed Consent: I have reviewed the patients History and Physical, chart, labs and discussed the procedure including the risks, benefits and alternatives for the  proposed anesthesia with the patient or authorized representative who has indicated his/her understanding and acceptance.     Dental advisory given  Plan Discussed with: CRNA and Surgeon  Anesthesia Plan Comments: (Discussed risks of anesthesia with patient, including possibility of difficulty with spontaneous ventilation under anesthesia necessitating airway intervention, PONV, and rare risks such as cardiac or respiratory or neurological events, and allergic reactions. Discussed the role of CRNA in patient's perioperative care. Patient understands.)       Anesthesia Quick Evaluation

## 2023-09-22 NOTE — Anesthesia Procedure Notes (Addendum)
Date/Time: 09/22/2023 9:00 AM  Performed by: Ginger Carne, CRNAPre-anesthesia Checklist: Patient identified, Emergency Drugs available, Suction available, Patient being monitored and Timeout performed Patient Re-evaluated:Patient Re-evaluated prior to induction Oxygen Delivery Method: Nasal cannula Preoxygenation: Pre-oxygenation with 100% oxygen Induction Type: IV induction Ventilation: Oral airway inserted - appropriate to patient size

## 2023-09-22 NOTE — H&P (Signed)
[name], MD [facility] [address] [place], [place] [address] [phone] Fax : [phone]  Primary Care Physician:  Center, [facility] Primary Gastroenterologist:  Dr. Servando Snare  Pre-Procedure History & Physical: HPI:  Megan [name] is a 53 y.o. female is here for an endoscopy and colonoscopy.   Past Medical History:  Diagnosis Date   Anxiety    Asthma    COVID-19 virus infection 08/2020   Hypothyroidism    Morbid obesity (HCC)     Past Surgical History:  Procedure Laterality Date   BREAST BIOPSY Right 11/11/2020   [place] bx, vision marker, - FRAGMENTS OF BENIGN INTRAMAMMARY LYMPH NODE.  NEGATIVE FOR ATYPICAL PROLIFERATIVE BREAST DISEASE.   BREAST BIOPSY Left 11/11/2020`   [place] bx, Q shape, FIBROEPITHELIAL PROLIFERATION WITH SCLEROSIS. NEGATIVE FOR ATYPICAL PROLIFERATIVE BREAST DISEASE IN THIS SAMPLE.    BREAST BIOPSY Left 12/03/2020   [place] bx, vision clip, fiboadenoma   BREAST BIOPSY Left 12/03/2020   [place] bx, x-clip, fibroadenoma   BREAST BIOPSY Left 02/03/2021   Procedure: BREAST BIOPSY WITH NEEDLE LOCALIZATION;  Surgeon: [name], MD;  Location: [facility] ORS;  Service: General;  Laterality: Left;   BREAST BIOPSY Right 01/21/2022   [place] core bx right breast ribbon clip path pending   BREAST BIOPSY Right 04/06/2022   Procedure: BREAST BIOPSY;  Surgeon: [name], MD;  Location: [facility] ORS;  Service: General;  Laterality: Right;   BREAST LUMPECTOMY Left 02/03/2021   LT 2:00 X clip NL done prior to lumpectomy    CESAREAN SECTION     FOOT SURGERY     bone spur   TUBAL LIGATION      Prior to Admission medications   Medication Sig Start Date End Date Taking? Authorizing Provider  Albuterol Sulfate, sensor, (PROAIR DIGIHALER) 108 (90 Base) MCG/ACT AEPB Inhale 2 puffs into the lungs every 4 (four) hours.   Yes [provider]  ascorbic acid (VITAMIN C) 500 MG tablet Take 1 tablet (500 mg total) by mouth daily. 06/19/21  Yes  Sheikh, Omair Latif, DO  BIKTARVY 50-200-25 MG TABS tablet Take 1 tablet by mouth daily. 11/27/21  Yes [provider]  levothyroxine (SYNTHROID) 88 MCG tablet Take 88 mcg by mouth daily before breakfast. 05/08/21  Yes [provider]  PARoxetine (PAXIL) 20 MG tablet Take 20 mg by mouth daily.   Yes [provider]  pregabalin (LYRICA) 100 MG capsule Take 100 mg by mouth 3 (three) times daily. 06/10/23  Yes [provider]  Ascorbic Acid 500 MG CHEW Chew 1 tablet by mouth daily. Patient not taking: Reported on 06/22/2023 06/19/21   [provider]  capsaicin (ZOSTRIX) 0.025 % cream  11/06/21   [provider]  dexamethasone (DECADRON) 0.1 % ophthalmic suspension Place 2 drops into both eyes every 4 (four) hours. 06/18/21   Marguerita Merles Latif, DO  diclofenac (VOLTAREN) 75 MG EC tablet Take 75 mg by mouth 2 (two) times daily as needed. 07/28/21   [provider]  docusate (COLACE) 50 MG/5ML liquid Take 10 mLs (100 mg total) by mouth 2 (two) times daily as needed for mild constipation. Patient not taking: Reported on 06/22/2023 06/18/21   Marguerita Merles Latif, DO  erythromycin ophthalmic ointment Place into the left eye every 8 (eight) hours. 06/18/21   Marguerita Merles Latif, DO  feeding supplement (ENSURE ENLIVE / ENSURE PLUS) LIQD Take 237 mLs by mouth 3 (three) times daily between meals. Patient not taking: Reported on  06/22/2023 06/18/21   Marguerita Merles Latif, DO  hydrocortisone 2.5 % cream Apply 1 Application topically once as needed. 10/22/21   [provider]  iron polysaccharides (NIFEREX) 150 MG capsule Take 1 capsule (150 mg total) by mouth daily. Patient not taking: Reported on 09/22/2023 06/19/21   Marguerita Merles Latif, DO  loratadine (CLARITIN) 10 MG tablet Take 10 mg by mouth daily as needed for allergies.    [provider]  phenol (CHLORASEPTIC) 1.4 % LIQD Use as directed 1 spray in the mouth or throat as needed  for throat irritation / pain. 06/18/21   Marguerita Merles Latif, DO  traZODone (DESYREL) 50 MG tablet Take 50 mg by mouth at bedtime as needed for sleep.    [provider]  vitamin B-12 (CYANOCOBALAMIN) 500 MCG tablet Take 1 tablet (500 mcg total) by mouth daily. Patient not taking: Reported on 06/22/2023 06/19/21   Marguerita Merles Latif, DO  VITAMIN D PO Take 1 tablet by mouth daily. Patient not taking: Reported on 06/22/2023    [provider]  Vitamin D, Ergocalciferol, (DRISDOL) 1.25 MG (50000 UNIT) CAPS capsule Take 1 capsule (50,000 Units total) by mouth every 7 (seven) days. Patient not taking: Reported on 06/22/2023 06/20/21   Marguerita Merles Latif, DO  WEGOVY 1 MG/0.5ML SOAJ Inject into the skin. 04/26/23   [provider]    Allergies as of 08/11/2023   (No Known Allergies)    Family History  Problem Relation Age of Onset   Thyroid disease Mother    Hypertension Mother    Diabetes Mother    Rheum arthritis Mother    Breast cancer Maternal Grandmother        great grandmother   Cancer Paternal Grandmother        Pancreatic   Ovarian cancer Other 74    Social History   Socioeconomic History   Marital status: Single    Spouse name: Not on file   Number of children: Not on file   Years of education: Not on file   Highest education level: Not on file  Occupational History   Not on file  Tobacco Use   Smoking status: Never   Smokeless tobacco: Never  Vaping Use   Vaping status: Never Used  Substance and Sexual Activity   Alcohol use: No   Drug use: No   Sexual activity: Yes    Birth control/protection: Surgical  Other Topics Concern   Not on file  Social History Narrative   Not on file   Social Drivers of Health   Financial Resource Strain: Not on file  Food Insecurity: Unknown (06/22/2023)   Hunger Vital Sign    Worried About Running Out of Food in the Last Year: Never true    Ran Out of Food in the Last Year: Not on file   Transportation Needs: Unknown (06/22/2023)   PRAPARE - Administrator, Civil Service (Medical): No    Lack of Transportation (Non-Medical): Not on file  Physical Activity: Not on file  Stress: Not on file  Social Connections: Not on file  Intimate Partner Violence: Unknown (06/22/2023)   Humiliation, Afraid, Rape, and Kick questionnaire    Fear of Current or Ex-Partner: No    Emotionally Abused: Not on file    Physically Abused: Not on file    Sexually Abused: Not on file    Review of Systems: See HPI, otherwise negative ROS  Physical Exam: BP 110/76   Temp (!) 96.4 F (  35.8 C) (Temporal)   Resp 18   Ht 5\' 5"  (1.651 m)   Wt 105.7 kg   SpO2 99%   BMI 38.77 kg/m  General:   Alert,  pleasant and cooperative in NAD Head:  Normocephalic and atraumatic. Neck:  Supple; no masses or thyromegaly. Lungs:  Clear throughout to auscultation.    Heart:  Regular rate and rhythm. Abdomen:  Soft, nontender and nondistended. Normal bowel sounds, without guarding, and without rebound.   Neurologic:  Alert and  oriented x4;  grossly normal neurologically.  Impression/Plan: Megan [name] is here for an endoscopy and colonoscopy to be performed for IDA  Risks, benefits, limitations, and alternatives regarding  endoscopy and colonoscopy have been reviewed with the patient.  Questions have been answered.  All parties agreeable.   [name], MD  09/22/2023, 8:51 AM

## 2023-09-22 NOTE — Op Note (Signed)
[facility] Gastroenterology Patient Name: Megan [name] Procedure Date: 09/22/2023 8:43 AM MRN: [id number] Account #: [id number] Date of Birth: October 29, 1970 Admit Type: Outpatient Age: 53 Room: [facility] ENDO ROOM 4 Gender: Female Note Status: Finalized Instrument Name: Prentice Docker [id number] Procedure:             Colonoscopy Indications:           Iron deficiency anemia Providers:             [name] MD, MD Referring MD:          Bonnita Nasuti j. [facility], dr (Referring MD) Medicines:             Propofol per Anesthesia Complications:         No immediate complications. Procedure:             Pre-Anesthesia Assessment:                        - Prior to the procedure, a History and Physical was                         performed, and patient medications and allergies were                         reviewed. The patient's tolerance of previous                         anesthesia was also reviewed. The risks and benefits                         of the procedure and the sedation options and risks                         were discussed with the patient. All questions were                         answered, and informed consent was obtained. Prior                         Anticoagulants: The patient has taken no anticoagulant                         or antiplatelet agents. ASA Grade Assessment: II - A                         patient with mild systemic disease. After reviewing                         the risks and benefits, the patient was deemed in                         satisfactory condition to undergo the procedure.                        After obtaining informed consent, the colonoscope was                         passed under direct vision. Throughout the procedure,  the patient's blood pressure, pulse, and oxygen                         saturations were monitored continuously. The                         Colonoscope was introduced through the  anus and                         advanced to the the cecum, identified by appendiceal                         orifice and ileocecal valve. The colonoscopy was                         performed without difficulty. The patient tolerated                         the procedure well. The quality of the bowel                         preparation was excellent. Findings:      The perianal and digital rectal examinations were normal.      Non-bleeding internal hemorrhoids were found during retroflexion. The       hemorrhoids were Grade I (internal hemorrhoids that do not prolapse).      Multiple small-mouthed diverticula were found in the ascending colon. Impression:            - Non-bleeding internal hemorrhoids.                        - Diverticulosis in the ascending colon.                        - No specimens collected. Recommendation:        - Discharge patient to home.                        - Resume previous diet.                        - Continue present medications.                        - Repeat colonoscopy in 10 years for screening                         purposes.                        - To visualize the small bowel, perform video capsule                         endoscopy. Procedure Code(s):     --- Professional ---                        [phone], Colonoscopy, flexible; diagnostic, including                         collection of specimen(s) by brushing or washing, when  performed (separate procedure) Diagnosis Code(s):     --- Professional ---                        D50.9, Iron deficiency anemia, unspecified CPT copyright 2022 American Medical Association. All rights reserved. The codes documented in this report are preliminary and upon coder review may  be revised to meet current compliance requirements. [name] MD, MD 09/22/2023 9:25:33 AM This report has been signed electronically. Number of Addenda: 0 Note Initiated On: 09/22/2023 8:43 AM Scope  Withdrawal Time: 0 hours 8 minutes 33 seconds  Total Procedure Duration: 0 hours 10 minutes 52 seconds  Estimated Blood Loss:  Estimated blood loss: none.      [facility]

## 2023-09-22 NOTE — Anesthesia Postprocedure Evaluation (Signed)
Anesthesia Post Note  Patient: Glendell Greb Ellerson  Procedure(s) Performed: COLONOSCOPY WITH PROPOFOL ESOPHAGOGASTRODUODENOSCOPY (EGD) WITH PROPOFOL  Patient location during evaluation: Endoscopy Anesthesia Type: General Level of consciousness: awake and alert Pain management: pain level controlled Vital Signs Assessment: post-procedure vital signs reviewed and stable Respiratory status: spontaneous breathing, nonlabored ventilation, respiratory function stable and patient connected to nasal cannula oxygen Cardiovascular status: blood pressure returned to baseline and stable Postop Assessment: no apparent nausea or vomiting Anesthetic complications: no   No notable events documented.   Last Vitals:  Vitals:   09/22/23 0943 09/22/23 0949  BP: 105/74 106/82  Pulse: 77 76  Resp: 16 16  Temp:    SpO2: 93% 93%    Last Pain:  Vitals:   09/22/23 0949  TempSrc:   PainSc: 0-No pain                 Corinda Gubler

## 2023-09-22 NOTE — Op Note (Signed)
[facility] Gastroenterology Patient Name: Megan [name] Procedure Date: 09/22/2023 8:43 AM MRN: [id number] Account #: [id number] Date of Birth: May 19, 1971 Admit Type: Outpatient Age: 53 Room: [facility] ENDO ROOM 4 Gender: Female Note Status: Finalized Instrument Name: Megan [name] Endoscope [id number] Procedure:             Upper GI endoscopy Indications:           Iron deficiency anemia Providers:             Megan Minium MD, MD Referring MD:          Megan [name] j. [facility], dr (Referring MD) Medicines:             Propofol per Anesthesia Complications:         No immediate complications. Procedure:             Pre-Anesthesia Assessment:                        - Prior to the procedure, a History and Physical was                         performed, and patient medications and allergies were                         reviewed. The patient's tolerance of previous                         anesthesia was also reviewed. The risks and benefits                         of the procedure and the sedation options and risks                         were discussed with the patient. All questions were                         answered, and informed consent was obtained. Prior                         Anticoagulants: The patient has taken no anticoagulant                         or antiplatelet agents. ASA Grade Assessment: II - A                         patient with mild systemic disease. After reviewing                         the risks and benefits, the patient was deemed in                         satisfactory condition to undergo the procedure.                        After obtaining informed consent, the endoscope was                         passed under direct vision. Throughout the procedure,  the patient's blood pressure, pulse, and oxygen                         saturations were monitored continuously. The Endoscope                         was introduced  through the mouth, and advanced to the                         second part of duodenum. The upper GI endoscopy was                         accomplished without difficulty. The patient tolerated                         the procedure well. Findings:      The esophagus was normal.      The stomach was normal.      The examined duodenum was normal. Impression:            - Normal esophagus.                        - Normal stomach.                        - Normal examined duodenum.                        - No specimens collected. Recommendation:        - Discharge patient to home.                        - Resume previous diet.                        - Continue present medications.                        - Perform a colonoscopy today. Procedure Code(s):     --- Professional ---                        [phone], Esophagogastroduodenoscopy, flexible,                         transoral; diagnostic, including collection of                         specimen(s) by brushing or washing, when performed                         (separate procedure) Diagnosis Code(s):     --- Professional ---                        D50.9, Iron deficiency anemia, unspecified CPT copyright 2022 American Medical Association. All rights reserved. The codes documented in this report are preliminary and upon coder review may  be revised to meet current compliance requirements. Megan Minium MD, MD 09/22/2023 9:08:59 AM This report has been signed electronically. Number of Addenda: 0 Note Initiated On: 09/22/2023 8:43 AM Estimated Blood Loss:  Estimated blood loss: none.  [facility]

## 2023-10-01 ENCOUNTER — Encounter: Payer: Self-pay | Admitting: Gastroenterology

## 2023-10-04 NOTE — Telephone Encounter (Signed)
 Left message on voicemail.

## 2023-10-15 ENCOUNTER — Encounter: Payer: Self-pay | Admitting: Oncology

## 2023-10-17 NOTE — Progress Notes (Signed)
 [facility] [address] [place], [place] [address]  Pulmonary Sleep Medicine   Office Visit Note  Patient Name: Megan [name] DOB: 1971/08/02 MRN [id number]    Chief Complaint: Obstructive Sleep Apnea visit  Brief History:  Zarahi is seen today for an initial sleep evaluation to establish care after set up on CPAP@10cmh20 .  The patient has a 10 month history of sleep apnea. Prior to set up on CPAP patient reports symptoms of EDS, snoring and witnessed apneas when she slept.  Patient is not consistently using PAP nightly.  The patient feels rested after sleeping with PAP.  The patient reports benefit from PAP use. Reported sleepiness is  somewhat improved and the Epworth Sleepiness Score is 15 out of 24. The patient does not usually take naps. The patient complains of the following: oral dryness  The compliance download shows 42%  compliance with an average use time of 4.5 hours. The AHI is 2.0  The patient does not complain of limb movements disrupting sleep.  ROS  General: (-) fever, (-) chills, (-) night sweat Nose and Sinuses: (-) nasal stuffiness or itchiness, (-) postnasal drip, (-) nosebleeds, (-) sinus trouble. Mouth and Throat: (-) sore throat, (-) hoarseness. Neck: (-) swollen glands, (-) enlarged thyroid, (-) neck pain. Respiratory: - cough, - shortness of breath, - wheezing. Neurologic: + numbness, + tingling related to herpes zoster Psychiatric: + anxiety, + depression   Current Medication: Outpatient Encounter Medications as of 10/18/2023  Medication Sig   VENTOLIN HFA 108 (90 Base) MCG/ACT inhaler Inhale into the lungs.   WEGOVY 2.4 MG/0.75ML SOAJ    ascorbic acid (VITAMIN C) 500 MG tablet Take 1 tablet (500 mg total) by mouth daily.   Ascorbic Acid 500 MG CHEW Chew 1 tablet by mouth daily. (Patient not taking: Reported on 06/22/2023)   BIKTARVY 50-200-25 MG TABS tablet Take 1 tablet by mouth daily.   capsaicin (ZOSTRIX) 0.025 % cream  (Patient  not taking: Reported on 06/22/2023)   dexamethasone (DECADRON) 0.1 % ophthalmic suspension Place 2 drops into both eyes every 4 (four) hours.   diclofenac (VOLTAREN) 75 MG EC tablet Take 75 mg by mouth 2 (two) times daily as needed.   docusate (COLACE) 50 MG/5ML liquid Take 10 mLs (100 mg total) by mouth 2 (two) times daily as needed for mild constipation. (Patient not taking: Reported on 06/22/2023)   erythromycin ophthalmic ointment Place into the left eye every 8 (eight) hours.   feeding supplement (ENSURE ENLIVE / ENSURE PLUS) LIQD Take 237 mLs by mouth 3 (three) times daily between meals. (Patient not taking: Reported on 06/22/2023)   hydrocortisone 2.5 % cream Apply 1 Application topically once as needed.   iron polysaccharides (NIFEREX) 150 MG capsule Take 1 capsule (150 mg total) by mouth daily. (Patient not taking: Reported on 09/22/2023)   levothyroxine (SYNTHROID) 88 MCG tablet Take 88 mcg by mouth daily before breakfast.   loratadine (CLARITIN) 10 MG tablet Take 10 mg by mouth daily as needed for allergies.   PARoxetine (PAXIL) 40 MG tablet Take 40 mg by mouth daily.   phenol (CHLORASEPTIC) 1.4 % LIQD Use as directed 1 spray in the mouth or throat as needed for throat irritation / pain.   pregabalin (LYRICA) 100 MG capsule Take 100 mg by mouth 3 (three) times daily.   traZODone (DESYREL) 50 MG tablet Take 50 mg by mouth at bedtime as needed for sleep.   valACYclovir (VALTREX) 1000 MG tablet Take 1,000 mg by mouth  3 (three) times daily.   vitamin B-12 (CYANOCOBALAMIN) 500 MCG tablet Take 1 tablet (500 mcg total) by mouth daily. (Patient not taking: Reported on 06/22/2023)   VITAMIN D PO Take 1 tablet by mouth daily. (Patient not taking: Reported on 06/22/2023)   Vitamin D, Ergocalciferol, (DRISDOL) 1.25 MG (50000 UNIT) CAPS capsule Take 1 capsule (50,000 Units total) by mouth every 7 (seven) days. (Patient not taking: Reported on 06/22/2023)   [DISCONTINUED] Albuterol Sulfate, sensor,  (PROAIR DIGIHALER) 108 (90 Base) MCG/ACT AEPB Inhale 2 puffs into the lungs every 4 (four) hours.   [DISCONTINUED] PARoxetine (PAXIL) 20 MG tablet Take 20 mg by mouth daily.   [DISCONTINUED] WEGOVY 1 MG/0.5ML SOAJ Inject into the skin.   No facility-administered encounter medications on file as of 10/18/2023.    Surgical History: Past Surgical History:  Procedure Laterality Date   BREAST BIOPSY Right 11/11/2020   [place] bx, vision marker, - FRAGMENTS OF BENIGN INTRAMAMMARY LYMPH NODE.  NEGATIVE FOR ATYPICAL PROLIFERATIVE BREAST DISEASE.   BREAST BIOPSY Left 11/11/2020`   [place] bx, Q shape, FIBROEPITHELIAL PROLIFERATION WITH SCLEROSIS. NEGATIVE FOR ATYPICAL PROLIFERATIVE BREAST DISEASE IN THIS SAMPLE.    BREAST BIOPSY Left 12/03/2020   [place] bx, vision clip, fiboadenoma   BREAST BIOPSY Left 12/03/2020   [place] bx, x-clip, fibroadenoma   BREAST BIOPSY Left 02/03/2021   Procedure: BREAST BIOPSY WITH NEEDLE LOCALIZATION;  Surgeon: [name], MD;  Location: [facility] ORS;  Service: General;  Laterality: Left;   BREAST BIOPSY Right 01/21/2022   [place] core bx right breast ribbon clip path pending   BREAST BIOPSY Right 04/06/2022   Procedure: BREAST BIOPSY;  Surgeon: [name], MD;  Location: [facility] ORS;  Service: General;  Laterality: Right;   BREAST LUMPECTOMY Left 02/03/2021   LT 2:00 X clip NL done prior to lumpectomy    CESAREAN SECTION     COLONOSCOPY WITH PROPOFOL N/A 09/22/2023   Procedure: COLONOSCOPY WITH PROPOFOL;  Surgeon: [name], MD;  Location: [facility] ENDOSCOPY;  Service: Endoscopy;  Laterality: N/A;   ESOPHAGOGASTRODUODENOSCOPY (EGD) WITH PROPOFOL N/A 09/22/2023   Procedure: ESOPHAGOGASTRODUODENOSCOPY (EGD) WITH PROPOFOL;  Surgeon: [name], MD;  Location: [facility] ENDOSCOPY;  Service: Endoscopy;  Laterality: N/A;   FOOT SURGERY     bone spur   TUBAL LIGATION      Medical History: Past Medical History:  Diagnosis Date   Anxiety    Asthma    COVID-19 virus infection 08/2020    Hypothyroidism    Morbid obesity (HCC)     Family History: Non contributory to the present illness  Social History: Social History   Socioeconomic History   Marital status: Single    Spouse name: Not on file   Number of children: Not on file   Years of education: Not on file   Highest education level: Not on file  Occupational History   Not on file  Tobacco Use   Smoking status: Never   Smokeless tobacco: Never  Vaping Use   Vaping status: Never Used  Substance and Sexual Activity   Alcohol use: No   Drug use: No   Sexual activity: Yes    Birth control/protection: Surgical  Other Topics Concern   Not on file  Social History Narrative   Not on file   Social Drivers of Health   Financial Resource Strain: Not on file  Food Insecurity: Unknown (06/22/2023)   Hunger Vital Sign    Worried About Running Out of Food in the Last Year: Never  true    Ran Out of Food in the Last Year: Not on file  Transportation Needs: Unknown (06/22/2023)   PRAPARE - Administrator, Civil Service (Medical): No    Lack of Transportation (Non-Medical): Not on file  Physical Activity: Not on file  Stress: Not on file  Social Connections: Not on file  Intimate Partner Violence: Unknown (06/22/2023)   Humiliation, Afraid, Rape, and Kick questionnaire    Fear of Current or Ex-Partner: No    Emotionally Abused: Not on file    Physically Abused: Not on file    Sexually Abused: Not on file    Vital Signs: Blood pressure (!) 123/90, pulse 81, resp. rate 16, height 5\' 5"  (1.651 m), weight 240 lb 1.6 oz (108.9 kg), SpO2 97%. Body mass index is 39.95 kg/m.    Examination: General Appearance: The patient is well-developed, well-nourished, and in no distress. Neck Circumference: 40cm Skin: Gross inspection of skin unremarkable. Head: normocephalic, no gross deformities. Eyes: no gross deformities noted. ENT: ears appear grossly normal Neurologic: Alert and oriented. No involuntary  movements.  STOP BANG RISK ASSESSMENT S (snore) Have you been told that you snore?     No   T (tired) Are you often tired, fatigued, or sleepy during the day?   YES  O (obstruction) Do you stop breathing, choke, or gasp during sleep? NO   P (pressure) Do you have or are you being treated for high blood pressure? NO   B (BMI) Is your body index greater than 35 kg/m? YES   A (age) Are you 65 years old or older? YES   N (neck) Do you have a neck circumference greater than 16 inches?   NO   G (gender) Are you a female? NO   TOTAL STOP/BANG "YES" ANSWERS 3       A STOP-Bang score of 2 or less is considered low risk, and a score of 5 or more is high risk for having either moderate or severe OSA. For people who score 3 or 4, doctors may need to perform further assessment to determine how likely they are to have OSA.         EPWORTH SLEEPINESS SCALE:  Scale:  (0)= no chance of dozing; (1)= slight chance of dozing; (2)= moderate chance of dozing; (3)= high chance of dozing  Chance  Situtation    Sitting and reading: 2    Watching TV: 3    Sitting Inactive in public: 2    As a passenger in car: 3      Lying down to rest: 2    Sitting and talking: 1    Sitting quielty after lunch: 2    In a car, stopped in traffic: 0   TOTAL SCORE:   15 out of 24    SLEEP STUDIES:  Split - [employer] 06-15-23 - AHI 39.7/hr   Supine AHI 52.9/hr  min Sp02 84%   CPAP @ 10cmH20   CPAP COMPLIANCE DATA:  Date Range: 08/15/2023-10/13/2023  Average Daily Use: 4.5 hours  Median Use:  4 hrs 50 min  Compliance for > 4 Hours: 42% days  AHI: 2.0 respiratory events per hour  Days Used: 43/60  Mask Leak: 18.3  95th Percentile Pressure: 10cmh20         LABS: Recent Results (from the past 2160 hours)  Iron and TIBC     Status: None   Collection Time: 08/23/23 10:24 AM  Result Value Ref Range  Iron 71 28 - 170 ug/dL   TIBC 161 096 - 045 ug/dL   Saturation Ratios 18 10.4 -  31.8 %   UIBC 332 ug/dL    Comment: Performed at [facility], [address] [place], [place] [address]  Ferritin     Status: None   Collection Time: 08/23/23 10:24 AM  Result Value Ref Range   Ferritin 12 11 - 307 ng/mL    Comment: Performed at [facility], [address] [place], [place] [address]  CBC with Differential (Cancer Center Only)     Status: Abnormal   Collection Time: 08/23/23 10:24 AM  Result Value Ref Range   WBC Count 5.2 4.0 - 10.5 K/uL   RBC 4.34 3.87 - 5.11 MIL/uL   Hemoglobin 11.6 (L) 12.0 - 15.0 g/dL   HCT 82.9 56.2 - 13.0 %   MCV 83.2 80.0 - 100.0 fL   MCH 26.7 26.0 - 34.0 pg   MCHC 32.1 30.0 - 36.0 g/dL   RDW 86.5 (H) 78.4 - 69.6 %   Platelet Count 267 150 - 400 K/uL   nRBC 0.0 0.0 - 0.2 %   Neutrophils Relative % 50 %   Neutro Abs 2.6 1.7 - 7.7 K/uL   Lymphocytes Relative 38 %   Lymphs Abs 2.0 0.7 - 4.0 K/uL   Monocytes Relative 9 %   Monocytes Absolute 0.5 0.1 - 1.0 K/uL   Eosinophils Relative 2 %   Eosinophils Absolute 0.1 0.0 - 0.5 K/uL   Basophils Relative 1 %   Basophils Absolute 0.0 0.0 - 0.1 K/uL   Immature Granulocytes 0 %   Abs Immature Granulocytes 0.01 0.00 - 0.07 K/uL    Comment: Performed at [facility], [address] [place], [place] [address]    Radiology: No results found.  No results found.  No results found.    Assessment and Plan: Patient Active Problem List   Diagnosis Date Noted   OSA (obstructive sleep apnea) 10/18/2023   CPAP use counseling 10/18/2023   Obesity (BMI 30-39.9) 10/18/2023   Microcytic anemia 06/22/2023   Herpes zoster dermatitis with ophthalmic complication 06/11/2021   Shingles 06/11/2021   Herpes zoster ophthalmicus of left eye    LFT elevation    Hypothyroidism    Morbid obesity (HCC)    Asthma    COVID-19 virus infection 08/2020   Nipple discharge 12/03/2015   Abnormal mammogram of left breast 12/03/2015    1. OSA (obstructive sleep apnea) (Primary) The  patient does tolerate PAP and reports  benefit from PAP use. The patient was reminded how to clean equipment and advised to replace supplies routinely. The patient was also counselled on weight loss. The compliance is poor. The AHI is 2.0.   OSA on cpap- controlled. Increase  compliance with pap. CPAP continues to be medically necessary to treat this patient's OSA. F/u 60m   2. CPAP use counseling CPAP Counseling: had a lengthy discussion with the patient regarding the importance of PAP therapy in management of the sleep apnea. Patient appears to understand the risk factor reduction and also understands the risks associated with untreated sleep apnea. Patient will try to make a good faith effort to remain compliant with therapy. Also instructed the patient on proper cleaning of the device including the water must be changed daily if possible and use of distilled water is preferred. Patient understands that the machine should be regularly cleaned with appropriate recommended cleaning solutions that do not damage the  PAP machine for example given white vinegar and water rinses. Other methods such as ozone treatment may not be as good as these simple methods to achieve cleaning.   3. Obesity (BMI 30-39.9) Obesity Counseling: Had a lengthy discussion regarding patients BMI and weight issues. Patient was instructed on portion control as well as increased activity. Also discussed caloric restrictions with trying to maintain intake less than 2000 Kcal. Discussions were made in accordance with the 5As of weight management. Simple actions such as not eating late and if able to, taking a walk is suggested.     General Counseling: I have discussed the findings of the evaluation and examination with [facility].  I have also discussed any further diagnostic evaluation thatmay be needed or ordered today. Aveleen verbalizes understanding of the findings of todays visit. We also reviewed her medications today and discussed  drug interactions and side effects including but not limited excessive drowsiness and altered mental states. We also discussed that there is always a risk not just to her but also people around her. she has been encouraged to call the office with any questions or concerns that should arise related to todays visit.  No orders of the defined types were placed in this encounter.       I have personally obtained a history, examined the patient, evaluated laboratory and imaging results, formulated the assessment and plan and placed orders. This patient was seen today by Emmaline Kluver, PA-C in collaboration with Dr. Freda Munro.   Yevonne Pax, MD [facility] Diplomate ABMS Pulmonary Critical Care Medicine and Sleep Medicine

## 2023-10-18 ENCOUNTER — Ambulatory Visit: Payer: Medicaid Other | Admitting: Internal Medicine

## 2023-10-18 VITALS — BP 123/90 | HR 81 | Resp 16 | Ht 65.0 in | Wt 240.1 lb

## 2023-10-18 DIAGNOSIS — E669 Obesity, unspecified: Secondary | ICD-10-CM

## 2023-10-18 DIAGNOSIS — Z7189 Other specified counseling: Secondary | ICD-10-CM

## 2023-10-18 DIAGNOSIS — G4733 Obstructive sleep apnea (adult) (pediatric): Secondary | ICD-10-CM | POA: Diagnosis not present

## 2023-10-18 NOTE — Patient Instructions (Signed)

## 2023-10-19 NOTE — Telephone Encounter (Signed)
 Left message on voicemail.

## 2023-10-21 NOTE — Telephone Encounter (Signed)
Patient called in and states she has been sick and unable to return our call. Transfer call to [facility]

## 2023-10-25 ENCOUNTER — Other Ambulatory Visit: Payer: Self-pay | Admitting: Nurse Practitioner

## 2023-10-25 DIAGNOSIS — Z1231 Encounter for screening mammogram for malignant neoplasm of breast: Secondary | ICD-10-CM

## 2023-11-01 ENCOUNTER — Emergency Department
Admission: EM | Admit: 2023-11-01 | Discharge: 2023-11-02 | Disposition: A | Discharge status: Home / Self Care | Attending: Emergency Medicine | Admitting: Emergency Medicine

## 2023-11-01 ENCOUNTER — Emergency Department

## 2023-11-01 ENCOUNTER — Other Ambulatory Visit: Payer: Self-pay

## 2023-11-01 DIAGNOSIS — R630 Anorexia: Secondary | ICD-10-CM | POA: Diagnosis not present

## 2023-11-01 DIAGNOSIS — R197 Diarrhea, unspecified: Secondary | ICD-10-CM | POA: Diagnosis not present

## 2023-11-01 DIAGNOSIS — E039 Hypothyroidism, unspecified: Secondary | ICD-10-CM | POA: Insufficient documentation

## 2023-11-01 DIAGNOSIS — R1031 Right lower quadrant pain: Secondary | ICD-10-CM | POA: Diagnosis not present

## 2023-11-01 DIAGNOSIS — R109 Unspecified abdominal pain: Secondary | ICD-10-CM | POA: Diagnosis present

## 2023-11-01 DIAGNOSIS — J45909 Unspecified asthma, uncomplicated: Secondary | ICD-10-CM | POA: Insufficient documentation

## 2023-11-01 DIAGNOSIS — K921 Melena: Secondary | ICD-10-CM | POA: Insufficient documentation

## 2023-11-01 DIAGNOSIS — R0602 Shortness of breath: Secondary | ICD-10-CM | POA: Insufficient documentation

## 2023-11-01 DIAGNOSIS — N83209 Unspecified ovarian cyst, unspecified side: Secondary | ICD-10-CM

## 2023-11-01 DIAGNOSIS — D259 Leiomyoma of uterus, unspecified: Secondary | ICD-10-CM

## 2023-11-01 LAB — CBC WITH DIFFERENTIAL/PLATELET
Abs Immature Granulocytes: 0 10*3/uL (ref 0.00–0.07)
Basophils Absolute: 0 10*3/uL (ref 0.0–0.1)
Basophils Relative: 1 %
Eosinophils Absolute: 0.2 10*3/uL (ref 0.0–0.5)
Eosinophils Relative: 4 %
HCT: 38.6 % (ref 36.0–46.0)
Hemoglobin: 12.5 g/dL (ref 12.0–15.0)
Immature Granulocytes: 0 %
Lymphocytes Relative: 43 %
Lymphs Abs: 1.9 10*3/uL (ref 0.7–4.0)
MCH: 29 pg (ref 26.0–34.0)
MCHC: 32.4 g/dL (ref 30.0–36.0)
MCV: 89.6 fL (ref 80.0–100.0)
Monocytes Absolute: 0.4 10*3/uL (ref 0.1–1.0)
Monocytes Relative: 9 %
Neutro Abs: 2 10*3/uL (ref 1.7–7.7)
Neutrophils Relative %: 43 %
Platelets: 237 10*3/uL (ref 150–400)
RBC: 4.31 MIL/uL (ref 3.87–5.11)
RDW: 14.6 % (ref 11.5–15.5)
WBC: 4.5 10*3/uL (ref 4.0–10.5)
nRBC: 0 % (ref 0.0–0.2)

## 2023-11-01 LAB — COMPREHENSIVE METABOLIC PANEL
ALT: 22 U/L (ref 0–44)
AST: 23 U/L (ref 15–41)
Albumin: 3.9 g/dL (ref 3.5–5.0)
Alkaline Phosphatase: 46 U/L (ref 38–126)
Anion gap: 6 (ref 5–15)
BUN: 9 mg/dL (ref 6–20)
CO2: 25 mmol/L (ref 22–32)
Calcium: 9.3 mg/dL (ref 8.9–10.3)
Chloride: 107 mmol/L (ref 98–111)
Creatinine, Ser: 1.08 mg/dL — ABNORMAL HIGH (ref 0.44–1.00)
GFR, Estimated: >60 mL/min (ref >60)
Glucose, Bld: 110 mg/dL — ABNORMAL HIGH (ref 70–99)
Potassium: 3.7 mmol/L (ref 3.5–5.1)
Sodium: 138 mmol/L (ref 135–145)
Total Bilirubin: 1.4 mg/dL — ABNORMAL HIGH (ref 0.0–1.2)
Total Protein: 7.6 g/dL (ref 6.5–8.1)

## 2023-11-01 LAB — RESP PANEL BY RT-PCR (RSV, FLU A&B, COVID)  RVPGX2
Influenza A by PCR: NEGATIVE
Influenza B by PCR: NEGATIVE
Resp Syncytial Virus by PCR: NEGATIVE
SARS Coronavirus 2 by RT PCR: NEGATIVE

## 2023-11-01 LAB — URINALYSIS, ROUTINE W REFLEX MICROSCOPIC: RBC / HPF: >50 RBC/hpf (ref 0–5)

## 2023-11-01 LAB — LIPASE, BLOOD: Lipase: 34 U/L (ref 11–51)

## 2023-11-01 LAB — TROPONIN I (HIGH SENSITIVITY)
Troponin I (High Sensitivity): 4 ng/L (ref <18)
Troponin I (High Sensitivity): 4 ng/L (ref <18)

## 2023-11-01 LAB — PREGNANCY, URINE: Preg Test, Ur: NEGATIVE

## 2023-11-01 MED ORDER — DIPHENHYDRAMINE HCL 50 MG/ML IJ SOLN
25.0000 mg | Freq: Once | INTRAMUSCULAR | Status: AC
  Administered 2023-11-01: 25 mg via INTRAVENOUS
  Filled 2023-11-01: qty 1

## 2023-11-01 MED ORDER — DIPHENHYDRAMINE HCL 50 MG/ML IJ SOLN: 12.5000 mg | Freq: Once | INTRAMUSCULAR | Status: DC

## 2023-11-01 MED ORDER — HYDROMORPHONE HCL 1 MG/ML IJ SOLN
0.5000 mg | Freq: Once | INTRAMUSCULAR | Status: AC
  Administered 2023-11-01: 0.5 mg via INTRAVENOUS
  Filled 2023-11-01: qty 0.5

## 2023-11-01 MED ORDER — IOHEXOL 350 MG/ML SOLN
100.0000 mL | Freq: Once | INTRAVENOUS | Status: AC | PRN
  Administered 2023-11-01: 100 mL via INTRAVENOUS

## 2023-11-01 MED ORDER — SODIUM CHLORIDE 0.9 % IV BOLUS
500.0000 mL | Freq: Once | INTRAVENOUS | Status: AC
  Administered 2023-11-01: 500 mL via INTRAVENOUS

## 2023-11-01 NOTE — ED Provider Notes (Incomplete)
-----------------------------------------   11:29 PM on 11/01/2023 -----------------------------------------  Assuming care from Dr. Marisa Severin.  In short, Megan [name] is a 53 y.o. female with a chief complaint of abdominal pain.  Refer to the original H&P for additional details.  The current plan of care is to follow up ultrasound, anticipate discharge if generally reassuring.   Clinical Course as of 11/02/23 0316  Tue Nov 02, 2023  0248 I viewed and interpreted the patient's pelvic ultrasound and can see an ovarian cyst but no other obvious abnormalities.  Radiology confirmed uterine fibroids and a likely hemorrhagic ovarian cyst on the right with no evidence of torsion.  I updated the patient who has been resting comfortably and has been observed in the emergency department for almost 12 hours.  I advised her about the findings and tried to explain the results (including both the fibroids and the ovarian cyst) and encouraged close outpatient follow-up and provided follow-up information for GYN.  I checked the [place] controlled substance database and see that she has no concerning prescribing habits so I wrote her a short course of Norco as well as recommending over-the-counter medication.  She understands and agrees with the plan. [CF]  0315 I received word from the staff that the patient was unhappy and dissatisfied with the explanation she received and with her discharge.  Once I finished up with another patient I returned to the room and apologized for not explaining her diagnoses well and ask if I could explain further.  She declined and said she will just get something to drink and go home and follow-up as an outpatient. [CF]    Clinical Course User Index [CF] Loleta Rose, MD     Medications  sodium chloride 0.9 % bolus 500 mL (0 mLs Intravenous Stopped 11/01/23 2100)  HYDROmorphone (DILAUDID) injection 0.5 mg (0.5 mg Intravenous Given 11/01/23 1852)  iohexol (OMNIPAQUE) 350  MG/ML injection 100 mL (100 mLs Intravenous Contrast Given 11/01/23 1958)  HYDROmorphone (DILAUDID) injection 0.5 mg (0.5 mg Intravenous Given 11/01/23 2148)  diphenhydrAMINE (BENADRYL) injection 25 mg (25 mg Intravenous Given 11/01/23 2241)     ED Discharge Orders          Ordered    HYDROcodone-acetaminophen (NORCO/VICODIN) 5-325 MG tablet  Every 6 hours PRN        11/02/23 0247           Final diagnoses:  Right lower quadrant abdominal pain  Uterine leiomyoma, unspecified location  Hemorrhagic ovarian cyst     Loleta Rose, MD 11/02/23 1610    Loleta Rose, MD 11/02/23 9604    Loleta Rose, MD 11/02/23 [phone]

## 2023-11-01 NOTE — ED Notes (Signed)
 Call husband when CT results come back  [phone]  Megan [name]

## 2023-11-01 NOTE — ED Provider Notes (Signed)
 [facility] Provider Note    Event Date/Time   First MD Initiated Contact with Patient 11/01/23 1801     (approximate)   History   Shortness of Breath   HPI  Megan [name] is a 53 y.o. female with a history of hypothyroidism, asthma, and OSA who presents with shortness of breath and abdominal pain over the last 3 days.  The abdominal pain is constant and is mainly in the right lower part of her abdomen.  She has had no vomiting but does have a decreased appetite.  She has had some diarrhea as well and noted some bright red blood in her stool.  She reports shortness of breath that is worse with exertion.  She does not have a cough.  She denies any fever.  She does report generalized weakness.  I reviewed the past medical records.  The patient's most recent outpatient encounter was with pulmonary sleep medicine on 2/17 to establish care after being started on CPAP at home.  The patient had a colonoscopy and upper endoscopy on 1/22 which showed nonbleeding internal hemorrhoids and diverticulosis.  Upper endoscopy was normal.   Physical Exam   Triage Vital Signs: ED Triage Vitals  Encounter Vitals Group     BP 11/01/23 1520 (!) 141/92     Systolic BP Percentile --      Diastolic BP Percentile --      Pulse Rate 11/01/23 1520 (!) 107     Resp 11/01/23 1520 18     Temp 11/01/23 1520 97.9 F (36.6 C)     Temp Source 11/01/23 1520 Oral     SpO2 11/01/23 1520 98 %     Weight 11/01/23 1514 236 lb (107 kg)     Height 11/01/23 1514 5\' 5"  (1.651 m)     Head Circumference --      Peak Flow --      Pain Score 11/01/23 1514 10     Pain Loc --      Pain Education --      Exclude from Growth Chart --     Most recent vital signs: Vitals:   11/01/23 1520 11/01/23 2037  BP: (!) 141/92 131/85  Pulse: (!) 107 72  Resp: 18 18  Temp: 97.9 F (36.6 C) 98.4 F (36.9 C)  SpO2: 98% 99%     General: Alert, no distress.  CV:  Good peripheral perfusion.   Resp:  Normal effort.  Lungs CTAB. Abd:  Soft with moderate tenderness to the right lower quadrant.  No distention.  Other:  No jaundice or scleral icterus.  Moist mucous membranes.   ED Results / Procedures / Treatments   Labs (all labs ordered are listed, but only abnormal results are displayed) Labs Reviewed  COMPREHENSIVE METABOLIC PANEL - Abnormal; Notable for the following components:      Result Value   Glucose, Bld 110 (*)    Creatinine, Ser 1.08 (*)    Total Bilirubin 1.4 (*)    All other components within normal limits  URINALYSIS, ROUTINE W REFLEX MICROSCOPIC - Abnormal; Notable for the following components:   Color, Urine RED (*)    APPearance CLOUDY (*)    Glucose, UA   (*)    Value: TEST NOT REPORTED DUE TO COLOR INTERFERENCE OF URINE PIGMENT   Hgb urine dipstick   (*)    Value: TEST NOT REPORTED DUE TO COLOR INTERFERENCE OF URINE PIGMENT   Bilirubin Urine   (*)  Value: TEST NOT REPORTED DUE TO COLOR INTERFERENCE OF URINE PIGMENT   Ketones, ur   (*)    Value: TEST NOT REPORTED DUE TO COLOR INTERFERENCE OF URINE PIGMENT   Protein, ur   (*)    Value: TEST NOT REPORTED DUE TO COLOR INTERFERENCE OF URINE PIGMENT   Nitrite   (*)    Value: TEST NOT REPORTED DUE TO COLOR INTERFERENCE OF URINE PIGMENT   Leukocytes,Ua   (*)    Value: TEST NOT REPORTED DUE TO COLOR INTERFERENCE OF URINE PIGMENT   Bacteria, UA RARE (*)    All other components within normal limits  RESP PANEL BY RT-PCR (RSV, FLU A&B, COVID)  RVPGX2  LIPASE, BLOOD  CBC WITH DIFFERENTIAL/PLATELET  PREGNANCY, URINE  POC URINE PREG, ED  TROPONIN I (HIGH SENSITIVITY)  TROPONIN I (HIGH SENSITIVITY)     EKG  ED ECG REPORT I, Dionne Bucy, the attending physician, personally viewed and interpreted this ECG.  Date: 11/01/2023 EKG Time: 1515 Rate: 84 Rhythm: normal sinus rhythm QRS Axis: normal Intervals: normal ST/T Wave abnormalities: normal Narrative Interpretation: no evidence of acute  ischemia    RADIOLOGY  Chest x-ray: I independently viewed and interpreted the images; there is no focal consolidation or edema  CT abdomen/pelvis:  IMPRESSION:  1. 5.1 x 5 x 4.8 cm thin walled grossly uncomplicated cyst in the  right ovary. In an asymptomatic patient the usual follow-up  recommendation would be for pelvic ultrasound 6-12 months. In this  case, since there is no other etiology for right lower quadrant pain  visible, would suggest pelvic ultrasound at this time to exclude  torsion.  2. Enlarged fibroid uterus with multiple fibroids largest 2 of which  are submucosal, measuring 5.8 cm and 6.5 cm.  3. Diverticulosis without evidence of diverticulitis.  4. Aortic atherosclerosis.  5. Mildly enlarged steatotic liver.    Aortic Atherosclerosis (ICD10-I70.0).    PROCEDURES:  Critical Care performed: No  Procedures   MEDICATIONS ORDERED IN ED: Medications  sodium chloride 0.9 % bolus 500 mL (0 mLs Intravenous Stopped 11/01/23 2100)  HYDROmorphone (DILAUDID) injection 0.5 mg (0.5 mg Intravenous Given 11/01/23 1852)  iohexol (OMNIPAQUE) 350 MG/ML injection 100 mL (100 mLs Intravenous Contrast Given 11/01/23 1958)  HYDROmorphone (DILAUDID) injection 0.5 mg (0.5 mg Intravenous Given 11/01/23 2148)  diphenhydrAMINE (BENADRYL) injection 25 mg (25 mg Intravenous Given 11/01/23 2241)     IMPRESSION / MDM / ASSESSMENT AND PLAN / ED COURSE  I reviewed the triage vital signs and the nursing notes.  53 year old female with PMH as noted above presents with right-sided abdominal pain over the last couple of days associated with diarrhea, some blood in the stool, as well as shortness of breath.  Differential diagnosis includes, but is not limited to, colitis, diverticulitis, gastroenteritis, influenza or other viral syndrome, pneumonia, acute bronchitis, less likely cardiac etiology.  Initial labs are reassuring; CBC is normal.  CMP and lipase do not show any acute findings.   Urinalysis does show some blood but this may be contamination as the patient states that she just started her menstrual period.  We will obtain chest x-ray, CT abdomen/pelvis, respiratory panel, cardiac enzymes, and reassess.  The patient reports a small amount of blood in her stools.  She recently had diverticulosis and nonbleeding internal hemorrhoids on a colonoscopy.  Her hemoglobin is normal.  There is no indication for further emergent workup be on the CT.  Patient's presentation is most consistent with acute complicated illness / injury  requiring diagnostic workup.  ----------------------------------------- 11:31 PM on 11/01/2023 -----------------------------------------  Chest x-ray is negative.  Respiratory panel is negative.  Lab workup is overall unremarkable.    CT shows large uterine fibroids as well as a relatively large right ovarian cyst.  I have ordered an ultrasound for further evaluation.  If this is negative for acute findings, I anticipate the patient will be appropriate for discharge home.   FINAL CLINICAL IMPRESSION(S) / ED DIAGNOSES   Final diagnoses:  Right lower quadrant abdominal pain     Rx / DC Orders   ED Discharge Orders     None        Note:  This document was prepared using Dragon voice recognition software and may include unintentional dictation errors.    Dionne Bucy, MD 11/01/23 2337

## 2023-11-01 NOTE — ED Triage Notes (Signed)
 Patient states shortness of breath, abdominal pain and rectal bleeding since last night.

## 2023-11-02 ENCOUNTER — Encounter: Payer: Self-pay | Admitting: Nurse Practitioner

## 2023-11-02 MED ORDER — HYDROCODONE-ACETAMINOPHEN 5-325 MG PO TABS: 2.0000 | ORAL_TABLET | Freq: Four times a day (QID) | ORAL | 0 refills | Status: AC | PRN

## 2023-11-02 NOTE — ED Notes (Signed)
 I went in pt's room to discharge and pt expressed concerns of being discharged with "the way she was currently feeling". Pt stated that she doesn't feel comfortable going home until she understand her condition better. Dr. York Cerise notified.

## 2023-11-02 NOTE — Discharge Instructions (Signed)
 As we discussed, your evaluation was generally reassuring.  You have uterine fibroids, and we included some information in your paperwork so that you can read about fibroids.  This specific pain you are experiencing currently is most likely the result of an ovarian cyst on your right ovary that is leaking some fluid or some blood.  This is a relatively common situation and typically resolves on its own.  We recommend you take over-the-counter ibuprofen and/or Tylenol as needed according to the label instructions for pain control.  Take Norco as prescribed for severe pain. Do not drink alcohol, drive or participate in any other potentially dangerous activities while taking this medication as it may make you sleepy. Do not take this medication with any other sedating medications, either prescription or over-the-counter. If you were prescribed Percocet or Vicodin, do not take these with acetaminophen (Tylenol) as it is already contained within these medications.   This medication is an opiate (or narcotic) pain medication and can be habit forming.  Use it as little as possible to achieve adequate pain control.  Do not use or use it with extreme caution if you have a history of opiate abuse or dependence.  If you are on a pain contract with your primary care doctor or a pain specialist, be sure to let them know you were prescribed this medication today from the [facility] Emergency Department.  This medication is intended for your use only - do not give any to anyone else and keep it in a secure place where nobody else, especially children, have access to it.  It will also cause or worsen constipation, so you may want to consider taking an over-the-counter stool softener while you are taking this medication.  We recommend that you follow-up with your primary care doctor, but you can also call the number listed for Dr. Dalbert Garnet to schedule follow-up appointment with a GYN specialist.  GYN specialists are used  to helping people with fibroids and ovarian cysts and other similar issues, and they may be able to provide additional recommendations and treatment options.    Return to the emergency department if you develop new or worsening symptoms that concern you.

## 2023-11-08 ENCOUNTER — Encounter: Payer: Self-pay | Admitting: Nurse Practitioner

## 2023-11-08 DIAGNOSIS — Z1231 Encounter for screening mammogram for malignant neoplasm of breast: Secondary | ICD-10-CM

## 2023-11-10 ENCOUNTER — Encounter: Payer: Self-pay | Admitting: Obstetrics

## 2023-11-10 ENCOUNTER — Ambulatory Visit: Admitting: Obstetrics

## 2023-11-10 VITALS — BP 117/71 | HR 77 | Ht 65.0 in | Wt 229.0 lb

## 2023-11-10 DIAGNOSIS — D250 Submucous leiomyoma of uterus: Secondary | ICD-10-CM | POA: Diagnosis not present

## 2023-11-10 DIAGNOSIS — N852 Hypertrophy of uterus: Secondary | ICD-10-CM | POA: Insufficient documentation

## 2023-11-10 DIAGNOSIS — N83209 Unspecified ovarian cyst, unspecified side: Secondary | ICD-10-CM

## 2023-11-10 DIAGNOSIS — R102 Pelvic and perineal pain unspecified side: Secondary | ICD-10-CM | POA: Insufficient documentation

## 2023-11-10 DIAGNOSIS — N920 Excessive and frequent menstruation with regular cycle: Secondary | ICD-10-CM | POA: Insufficient documentation

## 2023-11-10 NOTE — Progress Notes (Signed)
 GYNECOLOGY PROGRESS NOTE: NEW PATIENT  Subjective:  PCP: [facility]  Patient ID: Megan [name], female    DOB: Jul 13, 1971, 53 y.o.   MRN: [id number]  HPI  Patient is a 53 y.o. [phone] female who presents for ER follow up for fibroids and an ovarian cyst. Went in for diffuse abd pain and bloating,  CT abd/pelvis and US showed fibroid uterus and a R hemorrhagic, stable cyst. She has been told within the past several months that she had fibroids, has not had treatment for them. Is currently having monthly, regular periods that last 5-8 days, flow is heavy with clots, changes pad Q2hrs on heaviest days. Also has moderately painful cramping. Maybe an occasional hot flash, no vaginal dryness and hasn't skipped a period in recent memory. Feels bloated and distended all the time, difficult to bend over due to the bloating, no intermenstrual bleeding.   LMP 11/01/23.  Period Cycle (Days): 30 Period Duration (Days): 5-8 Period Pattern: Regular Menstrual Flow: Heavy Menstrual Control: Thin pad, Maxi pad, Panty liner Menstrual Control Change Freq (Hours): 2 Dysmenorrhea: (!) Moderate Dysmenorrhea Symptoms: Cramping, Throbbing  OB History     Gravida  4   Para  2   Term  2   Preterm      AB  2   Living  1      SAB      IAB      Ectopic      Multiple      Live Births           Obstetric Comments  1st Menstrual Cycle: 16  1st Pregnancy: 20         Last pap: 10/09/2020 NILM, HPV neg Denies hx of abnormal paps Denies hx of STI  Past Medical History:  Diagnosis Date   Anxiety    Asthma    COVID-19 virus infection 08/2020   Hypothyroidism    Morbid obesity (HCC)    Past Surgical History:  Procedure Laterality Date   BREAST BIOPSY Right 11/11/2020   [place] bx, vision marker, - FRAGMENTS OF BENIGN INTRAMAMMARY LYMPH NODE.  NEGATIVE FOR ATYPICAL PROLIFERATIVE BREAST DISEASE.   BREAST BIOPSY Left 11/11/2020`   [place] bx, Q shape, FIBROEPITHELIAL  PROLIFERATION WITH SCLEROSIS. NEGATIVE FOR ATYPICAL PROLIFERATIVE BREAST DISEASE IN THIS SAMPLE.    BREAST BIOPSY Left 12/03/2020   [place] bx, vision clip, fiboadenoma   BREAST BIOPSY Left 12/03/2020   [place] bx, x-clip, fibroadenoma   BREAST BIOPSY Left 02/03/2021   Procedure: BREAST BIOPSY WITH NEEDLE LOCALIZATION;  Surgeon: [name], MD;  Location: [facility] ORS;  Service: General;  Laterality: Left;   BREAST BIOPSY Right 01/21/2022   [place] core bx right breast ribbon clip path pending   BREAST BIOPSY Right 04/06/2022   Procedure: BREAST BIOPSY;  Surgeon: [name], MD;  Location: [facility] ORS;  Service: General;  Laterality: Right;   BREAST LUMPECTOMY Left 02/03/2021   LT 2:00 X clip NL done prior to lumpectomy    CESAREAN SECTION     COLONOSCOPY WITH PROPOFOL N/A 09/22/2023   Procedure: COLONOSCOPY WITH PROPOFOL;  Surgeon: [name], MD;  Location: [facility] ENDOSCOPY;  Service: Endoscopy;  Laterality: N/A;   ESOPHAGOGASTRODUODENOSCOPY (EGD) WITH PROPOFOL N/A 09/22/2023   Procedure: ESOPHAGOGASTRODUODENOSCOPY (EGD) WITH PROPOFOL;  Surgeon: [name], MD;  Location: [facility] ENDOSCOPY;  Service: Endoscopy;  Laterality: N/A;   FOOT SURGERY     bone spur   TUBAL LIGATION  Family History  Problem Relation Age of Onset   Thyroid disease Mother    Hypertension Mother    Diabetes Mother    Rheum arthritis Mother    Breast cancer Maternal Grandmother        great grandmother   Cancer Paternal Grandmother        Pancreatic   Ovarian cancer Other 21   Social History   Socioeconomic History   Marital status: Single    Spouse name: Not on file   Number of children: Not on file   Years of education: Not on file   Highest education level: Not on file  Occupational History   Not on file  Tobacco Use   Smoking status: Never   Smokeless tobacco: Never  Vaping Use   Vaping status: Never Used  Substance and Sexual Activity   Alcohol use: No   Drug use: No   Sexual activity: Yes     Birth control/protection: Surgical  Other Topics Concern   Not on file  Social History Narrative   Not on file   Social Drivers of Health   Financial Resource Strain: Not on file  Food Insecurity: Unknown (06/22/2023)   Hunger Vital Sign    Worried About Running Out of Food in the Last Year: Never true    Ran Out of Food in the Last Year: Not on file  Transportation Needs: Unknown (06/22/2023)   PRAPARE - Administrator, Civil Service (Medical): No    Lack of Transportation (Non-Medical): Not on file  Physical Activity: Not on file  Stress: Not on file  Social Connections: Not on file  Intimate Partner Violence: Unknown (06/22/2023)   Humiliation, Afraid, Rape, and Kick questionnaire    Fear of Current or Ex-Partner: No    Emotionally Abused: Not on file    Physically Abused: Not on file    Sexually Abused: Not on file   Current Outpatient Medications on File Prior to Visit  Medication Sig Dispense Refill   ascorbic acid (VITAMIN C) 500 MG tablet Take 1 tablet (500 mg total) by mouth daily. 30 tablet 0   Ascorbic Acid 500 MG CHEW Chew 1 tablet by mouth daily.     BIKTARVY 50-200-25 MG TABS tablet Take 1 tablet by mouth daily.     capsaicin (ZOSTRIX) 0.025 % cream      dexamethasone (DECADRON) 0.1 % ophthalmic suspension Place 2 drops into both eyes every 4 (four) hours. 15 mL 0   diclofenac (VOLTAREN) 75 MG EC tablet Take 75 mg by mouth 2 (two) times daily as needed.     docusate (COLACE) 50 MG/5ML liquid Take 10 mLs (100 mg total) by mouth 2 (two) times daily as needed for mild constipation. 100 mL 0   erythromycin ophthalmic ointment Place into the left eye every 8 (eight) hours. 3.5 g 0   feeding supplement (ENSURE ENLIVE / ENSURE PLUS) LIQD Take 237 mLs by mouth 3 (three) times daily between meals. 237 mL 12   HYDROcodone-acetaminophen (NORCO/VICODIN) 5-325 MG tablet Take 2 tablets by mouth every 6 (six) hours as needed for moderate pain (pain score 4-6) or severe  pain (pain score 7-10). 16 tablet 0   hydrocortisone 2.5 % cream Apply 1 Application topically once as needed.     iron polysaccharides (NIFEREX) 150 MG capsule Take 1 capsule (150 mg total) by mouth daily. 30 capsule 0   levothyroxine (SYNTHROID) 88 MCG tablet Take 88 mcg by mouth daily before breakfast.  loratadine (CLARITIN) 10 MG tablet Take 10 mg by mouth daily as needed for allergies.     PARoxetine (PAXIL) 40 MG tablet Take 40 mg by mouth daily.     phenol (CHLORASEPTIC) 1.4 % LIQD Use as directed 1 spray in the mouth or throat as needed for throat irritation / pain. 118 mL 0   pregabalin (LYRICA) 100 MG capsule Take 100 mg by mouth 3 (three) times daily.     traZODone (DESYREL) 50 MG tablet Take 50 mg by mouth at bedtime as needed for sleep.     valACYclovir (VALTREX) 1000 MG tablet Take 1,000 mg by mouth 3 (three) times daily.     VENTOLIN HFA 108 (90 Base) MCG/ACT inhaler Inhale into the lungs.     vitamin B-12 (CYANOCOBALAMIN) 500 MCG tablet Take 1 tablet (500 mcg total) by mouth daily. 30 tablet 0   VITAMIN D PO Take 1 tablet by mouth daily.     Vitamin D, Ergocalciferol, (DRISDOL) 1.25 MG (50000 UNIT) CAPS capsule Take 1 capsule (50,000 Units total) by mouth every 7 (seven) days. 5 capsule 0   WEGOVY 2.4 MG/0.75ML SOAJ      No current facility-administered medications on file prior to visit.   No Known Allergies  Review of Systems Pertinent items are noted in HPI.   Objective:   Blood pressure 117/71, pulse 77, height 5\' 5"  (1.651 m), weight 229 lb (103.9 kg), last menstrual period 11/01/2023. Body mass index is 38.11 kg/m.  Physical Exam Vitals and nursing note reviewed.  Constitutional:      Appearance: Normal appearance. She is obese.  HENT:     Head: Normocephalic and atraumatic.  Eyes:     Extraocular Movements: Extraocular movements intact.  Cardiovascular:     Rate and Rhythm: Normal rate.  Pulmonary:     Effort: Pulmonary effort is normal.  Abdominal:      Palpations: Abdomen is soft.     Tenderness: There is abdominal tenderness (suprapubic).  Musculoskeletal:        General: Normal range of motion.  Neurological:     General: No focal deficit present.     Mental Status: She is alert.  Psychiatric:        Mood and Affect: Affect is flat.        Speech: Speech normal.    Assessment/Plan:   1. Submucous leiomyoma of uterus   2. Hemorrhagic ovarian cyst    Luv Mish Brockbank is a 53 y.o. [phone] with a fibroid uterus and R ovarian cyst, experiencing bulk symptoms and HMB, still having regular periods, not experiencing perimenopausal symptoms. Reviewed likelihood that R ovarian cyst rupturing/bleeding was likely the cause of her acute abdominal pain that took her to ED. She continues to experience HMB and bulk symptoms, discussed management options for her fibroids. She declines medical therapies, feels she already takes too many pills. We narrowed her choices down to UFE vs hysterectomy as definitive surgical management.  Discussed risks and benefits of each method. Patient would like to think about it, literature on fibroids, hysterectomy and UFE provided today. She requests referral for UFE consultation, not sure that she will go through with it, but wants to discuss further. Aware if desires hysterectomy, she should schedule appointment with Dr. Logan Bores. Bleeding precautions reviewed.    Total time was 45 minutes. That includes chart review before the visit, the actual patient visit, and time spent on documentation after the visit. Time excludes procedures, if any.  Julieanne Manson, DO [facility]

## 2023-11-12 ENCOUNTER — Telehealth: Payer: Self-pay | Admitting: Obstetrics

## 2023-11-12 NOTE — Telephone Encounter (Signed)
 Dr. Lonny Prude,  Patient called on the telephone advice line and wants you to give her a call about a  decision that she has made.  Patient declined to speak to triage and was given office hours.

## 2023-11-15 NOTE — Telephone Encounter (Signed)
 Pt wants to start the medication.

## 2023-11-16 ENCOUNTER — Other Ambulatory Visit: Payer: Self-pay | Admitting: Nurse Practitioner

## 2023-11-16 DIAGNOSIS — N644 Mastodynia: Secondary | ICD-10-CM

## 2023-11-16 NOTE — Telephone Encounter (Signed)
 The patient is scheduled for 3/24 with Dr Lonny Prude.

## 2023-11-22 ENCOUNTER — Ambulatory Visit (INDEPENDENT_AMBULATORY_CARE_PROVIDER_SITE_OTHER): Admitting: Obstetrics

## 2023-11-22 ENCOUNTER — Encounter: Payer: Self-pay | Admitting: Obstetrics

## 2023-11-22 VITALS — BP 138/78 | HR 87 | Ht 65.0 in | Wt 234.0 lb

## 2023-11-22 DIAGNOSIS — R102 Pelvic and perineal pain: Secondary | ICD-10-CM

## 2023-11-22 DIAGNOSIS — N920 Excessive and frequent menstruation with regular cycle: Secondary | ICD-10-CM | POA: Diagnosis not present

## 2023-11-22 DIAGNOSIS — N852 Hypertrophy of uterus: Secondary | ICD-10-CM | POA: Diagnosis not present

## 2023-11-22 DIAGNOSIS — D250 Submucous leiomyoma of uterus: Secondary | ICD-10-CM | POA: Diagnosis not present

## 2023-11-22 NOTE — Progress Notes (Signed)
    GYNECOLOGY PROGRESS NOTE  Subjective:  PCP: [facility]  Patient ID: Megan [name], female    DOB: 1970-11-15, 53 y.o.   MRN: [id number]  HPI  Patient is a 53 y.o. [id number] female who presents for a follow up from 11/10/23 for fibroids. Pt is now interested in starting a medication. She initially wanted to avoid medicines and desired consult for [place], referral placed. She did not go to this appointment. Also wanted to let Dr Lonny Prude know that she had some dark brown spotting two weeks ago, that lasted 1 day.  The following portions of the patient's history were reviewed and updated as appropriate: allergies, current medications, past family history, past medical history, past social history, past surgical history, and problem list.  Review of Systems Pertinent items are noted in HPI.   Objective:   Blood pressure 138/78, pulse 87, height 5\' 5"  (1.651 m), weight 234 lb (106.1 kg), last menstrual period 11/01/2023. Body mass index is 38.94 kg/m.  General appearance: alert and cooperative Extremities: extremities normal, atraumatic, no cyanosis or edema Neurologic: Grossly normal   Assessment/Plan:   1. Submucous leiomyoma of uterus   2. Bulky or enlarged uterus   3. Pelvic pressure in female   4. Menorrhagia with regular cycle   Long discussion re: medications to treat the bleeding and bulk symptoms. After thorough discussion of all the medications, pt ultimately still desires consultation for [place], did not realize referral message required her to call to schedule. MyChart documentation shows pt did see this message on 3/15.  Provided information and phone number from referral coordinator today on paper, showed her how it's in MyChart as well. If pt does not desire [place], she will follow up with me to further decide on medications. Mild concern for pt's memory, as she forgot multiple things we discussed today in the same conversation.    Total time was 40  minutes. That includes chart review before the visit, the actual patient visit, and time spent on documentation after the visit. Time excludes procedures, if any.    Julieanne Manson, DO [facility]

## 2023-11-23 ENCOUNTER — Inpatient Hospital Stay: Payer: Medicaid Other | Attending: Oncology

## 2023-12-13 ENCOUNTER — Ambulatory Visit
Admission: RE | Admit: 2023-12-13 | Discharge: 2023-12-13 | Disposition: A | Discharge status: Home / Self Care | Source: Ambulatory Visit | Attending: Nurse Practitioner

## 2023-12-13 ENCOUNTER — Ambulatory Visit
Admission: RE | Admit: 2023-12-13 | Discharge: 2023-12-13 | Disposition: A | Discharge status: Home / Self Care | Source: Ambulatory Visit | Attending: Nurse Practitioner | Admitting: Nurse Practitioner

## 2023-12-13 DIAGNOSIS — N644 Mastodynia: Secondary | ICD-10-CM | POA: Insufficient documentation

## 2023-12-23 ENCOUNTER — Encounter: Payer: Self-pay | Admitting: Oncology

## 2023-12-24 NOTE — Progress Notes (Signed)
 [facility] [address] [place], [place] [address]  Pulmonary Sleep Medicine   Office Visit Note  Patient Name: Megan [name] DOB: [birth year] MRN [id number]    Chief Complaint: Obstructive Sleep Apnea visit  Brief History:  Megan [name] is seen today for a follow up visit for CPAP@ 10 cmH2O. The patient has a 1 year history of sleep apnea. Patient is not using PAP nightly.  The patient feels rested after sleeping with PAP.  The patient reports benefiting from PAP use. Reported sleepiness is  improved and the Epworth Sleepiness Score is 12 out of 24. The patient will occasionally take naps. The patient complains of the following: pt has been having trouble wearing PAP due to discomfort from nasal congestion. The compliance download shows 38% compliance with an average use time of 4 hours 28 minutes. The AHI is 1.9.  The patient does not complain of limb movements disrupting sleep. The patient continues to require PAP therapy in order to eliminate sleep apnea.   ROS  General: (-) fever, (-) chills, (-) night sweat Nose and Sinuses: (-) nasal stuffiness or itchiness, (-) postnasal drip, (-) nosebleeds, (-) sinus trouble. Mouth and Throat: (-) sore throat, (-) hoarseness. Neck: (-) swollen glands, (-) enlarged thyroid, (-) neck pain. Respiratory: - cough, - shortness of breath, - wheezing. Neurologic: - numbness, - tingling. Psychiatric: + anxiety, + depression   Current Medication: Outpatient Encounter Medications as of 12/27/2023  Medication Sig   Ferrous Sulfate (IRON ) 325 (65 Fe) MG TABS Take 1 tablet by mouth daily.   ascorbic acid  (VITAMIN C) 500 MG tablet Take 1 tablet (500 mg total) by mouth daily.   BIKTARVY 50-200-25 MG TABS tablet Take 1 tablet by mouth daily.   capsaicin (ZOSTRIX) 0.025 % cream    dexamethasone  (DECADRON ) 0.1 % ophthalmic suspension Place 2 drops into both eyes every 4 (four) hours.   docusate (COLACE) 50 MG/5ML liquid Take 10 mLs (100 mg  total) by mouth 2 (two) times daily as needed for mild constipation.   feeding supplement (ENSURE ENLIVE / ENSURE PLUS) LIQD Take 237 mLs by mouth 3 (three) times daily between meals.   HYDROcodone -acetaminophen  (NORCO/VICODIN) 5-325 MG tablet Take 2 tablets by mouth every 6 (six) hours as needed for moderate pain (pain score 4-6) or severe pain (pain score 7-10).   hydrocortisone 2.5 % cream Apply 1 Application topically once as needed.   iron  polysaccharides (NIFEREX) 150 MG capsule Take 1 capsule (150 mg total) by mouth daily.   levothyroxine  (SYNTHROID ) 88 MCG tablet Take 88 mcg by mouth daily before breakfast.   loratadine  (CLARITIN ) 10 MG tablet Take 10 mg by mouth daily as needed for allergies.   PARoxetine  (PAXIL ) 40 MG tablet Take 40 mg by mouth daily.   pregabalin (LYRICA) 100 MG capsule Take 100 mg by mouth 3 (three) times daily.   traZODone  (DESYREL ) 50 MG tablet Take 50 mg by mouth at bedtime as needed for sleep.   valACYclovir  (VALTREX ) 1000 MG tablet Take 1,000 mg by mouth 3 (three) times daily.   VENTOLIN  HFA 108 (90 Base) MCG/ACT inhaler Inhale into the lungs.   vitamin B-12 (CYANOCOBALAMIN ) 500 MCG tablet Take 1 tablet (500 mcg total) by mouth daily.   WEGOVY 2.4 MG/0.75ML SOAJ    [DISCONTINUED] Ascorbic Acid  500 MG CHEW Chew 1 tablet by mouth daily.   [DISCONTINUED] diclofenac (VOLTAREN) 75 MG EC tablet Take 75 mg by mouth 2 (two) times daily as needed.   [DISCONTINUED] erythromycin  ophthalmic  ointment Place into the left eye every 8 (eight) hours.   [DISCONTINUED] phenol (CHLORASEPTIC) 1.4 % LIQD Use as directed 1 spray in the mouth or throat as needed for throat irritation / pain.   [DISCONTINUED] VITAMIN D  PO Take 1 tablet by mouth daily.   [DISCONTINUED] Vitamin D , Ergocalciferol , (DRISDOL ) 1.25 MG (50000 UNIT) CAPS capsule Take 1 capsule (50,000 Units total) by mouth every 7 (seven) days.   No facility-administered encounter medications on file as of 12/27/2023.     Surgical History: Past Surgical History:  Procedure Laterality Date   BREAST BIOPSY Right 11/11/2020   us  bx, vision marker, - FRAGMENTS OF BENIGN INTRAMAMMARY LYMPH NODE.  NEGATIVE FOR ATYPICAL PROLIFERATIVE BREAST DISEASE.   BREAST BIOPSY Left 11/11/2020`   us  bx, Q shape, FIBROEPITHELIAL PROLIFERATION WITH SCLEROSIS. NEGATIVE FOR ATYPICAL PROLIFERATIVE BREAST DISEASE IN THIS SAMPLE.    BREAST BIOPSY Left 12/03/2020   us  bx, vision clip, fiboadenoma   BREAST BIOPSY Left 12/03/2020   us  bx, x-clip, fibroadenoma   BREAST BIOPSY Left 02/03/2021   Procedure: BREAST BIOPSY WITH NEEDLE LOCALIZATION;  Surgeon: Marshall Skeeter, MD;  Location: [facility] ORS;  Service: General;  Laterality: Left;   BREAST BIOPSY Right 01/21/2022   Us  core bx right breast ribbon clip path pending   BREAST BIOPSY Right 04/06/2022   Procedure: BREAST BIOPSY;  Surgeon: Marshall Skeeter, MD;  Location: [facility] OR[facility];  Service: General;  Laterality: Right;   BREAST LUMPECTOMY Left 02/03/2021   LT 2:00 X clip NL done prior to lumpectomy    CESAREAN SECTION     COLONOSCOPY WITH PROPOFOL  N/A 09/22/2023   Procedure: COLONOSCOPY WITH PROPOFOL ;  Surgeon: Marnee Sink, MD;  Location: [facility] END[facility];  Service: Endoscopy;  Laterality: N/A;   ESOPHAGOGASTRODUODENOSCOPY (EGD) WITH PROPOFOL  N/A 09/22/2023   Procedure: ESOPHAGOGASTRODUODENOSCOPY (EGD) WITH PROPOFOL ;  Surgeon: Marnee Sink, MD;  Location: [facility] ENDOS[facility];  Service: Endoscopy;  Laterality: N/A;   FOOT SURGERY     bone spur   TUBAL LIGATION      Medical History: Past Medical History:  Diagnosis Date   Anxiety    Asthma    COVID-19 virus infection 08/2020   Hypothyroidism    Morbid obesity (HCC)     Family History: Non contributory to the present illness  Social History: Social History   Socioeconomic History   Marital status: Single    Spouse name: Not on file   Number of children: Not on file   Years of education: Not on file   Highest education  level: Not on file  Occupational History   Not on file  Tobacco Use   Smoking status: Never   Smokeless tobacco: Never  Vaping Use   Vaping status: Never Used  Substance and Sexual Activity   Alcohol use: No   Drug use: No   Sexual activity: Yes    Birth control/protection: Surgical  Other Topics Concern   Not on file  Social History Narrative   Not on file   Social Drivers of Health   Financial Resource Strain: Not on file  Food Insecurity: Unknown (06/22/2023)   Hunger Vital Sign    Worried About Running Out of Food in the Last Year: Never true    Ran Out of Food in the Last Year: Not on file  Transportation Needs: Unknown (06/22/2023)   PRAPARE - Administrator, Civil Service (Medical): No    Lack of Transportation (Non-Medical): Not on file  Physical Activity: Not on file  Stress: Not on file  Social Connections: Not on file  Intimate Partner Violence: Unknown (06/22/2023)   Humiliation, Afraid, Rape, and Kick questionnaire    Fear of Current or Ex-Partner: No    Emotionally Abused: Not on file    Physically Abused: Not on file    Sexually Abused: Not on file    Vital Signs: Blood pressure 119/84, pulse 71, resp. rate 16, height 5\' 5"  (1.651 m), weight 231 lb (104.8 kg), last menstrual period 12/10/2023, SpO2 98%. Body mass index is 38.44 kg/m.    Examination: General Appearance: The patient is well-developed, well-nourished, and in no distress. Neck Circumference: 40 cm Skin: Gross inspection of skin unremarkable. Head: normocephalic, no gross deformities. Eyes: no gross deformities noted. ENT: ears appear grossly normal Neurologic: Alert and oriented. No involuntary movements.  STOP BANG RISK ASSESSMENT S (snore) Have you been told that you snore?     NO   T (tired) Are you often tired, fatigued, or sleepy during the day?   YES  O (obstruction) Do you stop breathing, choke, or gasp during sleep? NO   P (pressure) Do you have or are you  being treated for high blood pressure? NO   B (BMI) Is your body index greater than 35 kg/m? YES   A (age) Are you 4 years old or older? YES   N (neck) Do you have a neck circumference greater than 16 inches?   NO   G (gender) Are you a female? NO   TOTAL STOP/BANG "YES" ANSWERS 3       A STOP-Bang score of 2 or less is considered low risk, and a score of 5 or more is high risk for having either moderate or severe OSA. For people who score 3 or 4, doctors may need to perform further assessment to determine how likely they are to have OSA.         EPWORTH SLEEPINESS SCALE:  Scale:  (0)= no chance of dozing; (1)= slight chance of dozing; (2)= moderate chance of dozing; (3)= high chance of dozing  Chance  Situtation    Sitting and reading: 3    Watching TV: 2    Sitting Inactive in public: 1    As a passenger in car: 3      Lying down to rest: 2    Sitting and talking: 1    Sitting quielty after lunch: 1    In a car, stopped in traffic: 1   TOTAL SCORE:   12 out of 24    SLEEP STUDIES:  Split - [employer] 06-15-23 - AHI 39.7/hr, Supine AHI 52.9/hr , min Sp02 84% CPAP @ 10cmH20    CPAP COMPLIANCE DATA:  Date Range: 08/11/2023-12/23/2023  Average Daily Use: 4 hours 28 minutes  Median Use: 4 hours 19 minutes  Compliance for > 4 Hours: 38%  AHI: 1.9 respiratory events per hour  Days Used: 86/135 days  Mask Leak: 15.5  95th Percentile Pressure: 10         LABS: Recent Results (from the past 2160 hours)  Comprehensive metabolic panel     Status: Abnormal   Collection Time: 11/01/23  3:22 PM  Result Value Ref Range   Sodium 138 135 - 145 mmol/L   Potassium 3.7 3.5 - 5.1 mmol/L   Chloride 107 98 - 111 mmol/L   CO2 25 22 - 32 mmol/L   Glucose, Bld 110 (H) 70 - 99 mg/dL    Comment: Glucose reference range applies only  to samples taken after fasting for at least 8 hours.   BUN 9 6 - 20 mg/dL   Creatinine, Ser 1.61 (H) 0.44 - 1.00 mg/dL   Calcium  9.3 8.9 - 09.6 mg/dL   Total Protein 7.6 6.5 - 8.1 g/dL   Albumin 3.9 3.5 - 5.0 g/dL   AST 23 15 - 41 U/L   ALT 22 0 - 44 U/L   Alkaline Phosphatase 46 38 - 126 U/L   Total Bilirubin 1.4 (H) 0.0 - 1.2 mg/dL   GFR, Estimated >04 >54 mL/min    Comment: (NOTE) Calculated using the CKD-EPI Creatinine Equation (2021)    Anion gap 6 5 - 15    Comment: Performed at [facility], [address] [place], [place] [address]  Lipase, blood     Status: None   Collection Time: 11/01/23  3:22 PM  Result Value Ref Range   Lipase 34 11 - 51 U/L    Comment: Performed at [facility], [address] [place], [place] [address]  CBC with Diff     Status: None   Collection Time: 11/01/23  3:22 PM  Result Value Ref Range   WBC 4.5 4.0 - 10.5 K/uL   RBC 4.31 3.87 - 5.11 MIL/uL   Hemoglobin 12.5 12.0 - 15.0 g/dL   HCT 29.5 62.1 - 30.8 %   MCV 89.6 80.0 - 100.0 fL   MCH 29.0 26.0 - 34.0 pg   MCHC 32.4 30.0 - 36.0 g/dL   RDW 65.7 84.6 - 96.2 %   Platelets 237 150 - 400 K/uL   nRBC 0.0 0.0 - 0.2 %   Neutrophils Relative % 43 %   Neutro Abs 2.0 1.7 - 7.7 K/uL   Lymphocytes Relative 43 %   Lymphs Abs 1.9 0.7 - 4.0 K/uL   Monocytes Relative 9 %   Monocytes Absolute 0.4 0.1 - 1.0 K/uL   Eosinophils Relative 4 %   Eosinophils Absolute 0.2 0.0 - 0.5 K/uL   Basophils Relative 1 %   Basophils Absolute 0.0 0.0 - 0.1 K/uL   Immature Granulocytes 0 %   Abs Immature Granulocytes 0.00 0.00 - 0.07 K/uL    Comment: Performed at [facility], [address] [place], [place] [address]  Urinalysis, Routine w reflex microscopic -Urine, Clean Catch     Status: Abnormal   Collection Time: 11/01/23  3:22 PM  Result Value Ref Range   Color, Urine RED (A) YELLOW   APPearance CLOUDY (A) CLEAR   Specific Gravity, Urine  1.005 - 1.030    TEST NOT REPORTED DUE TO COLOR INTERFERENCE OF URINE PIGMENT   pH  5.0 - 8.0    TEST NOT REPORTED DUE TO COLOR INTERFERENCE OF URINE PIGMENT    Glucose, UA (A) NEGATIVE mg/dL    TEST NOT REPORTED DUE TO COLOR INTERFERENCE OF URINE PIGMENT   Hgb urine dipstick (A) NEGATIVE    TEST NOT REPORTED DUE TO COLOR INTERFERENCE OF URINE PIGMENT   Bilirubin Urine (A) NEGATIVE    TEST NOT REPORTED DUE TO COLOR INTERFERENCE OF URINE PIGMENT   Ketones, ur (A) NEGATIVE mg/dL    TEST NOT REPORTED DUE TO COLOR INTERFERENCE OF URINE PIGMENT   Protein, ur (A) NEGATIVE mg/dL    TEST NOT REPORTED DUE TO COLOR INTERFERENCE OF URINE PIGMENT   Nitrite (A) NEGATIVE    TEST NOT REPORTED DUE TO COLOR INTERFERENCE OF URINE PIGMENT   Leukocytes,Ua (A) NEGATIVE    TEST  NOT REPORTED DUE TO COLOR INTERFERENCE OF URINE PIGMENT   RBC / HPF >50 0 - 5 RBC/hpf   WBC, UA 21-50 0 - 5 WBC/hpf   Bacteria, UA RARE (A) NONE SEEN   Squamous Epithelial / HPF 6-10 0 - 5 /HPF    Comment: Performed at [facility], [address] [place], [place] [address]  Troponin I (High Sensitivity)     Status: None   Collection Time: 11/01/23  3:22 PM  Result Value Ref Range   Troponin I (High Sensitivity) 4 <18 ng/L    Comment: (NOTE) Elevated high sensitivity troponin I (hsTnI) values and significant  changes across serial measurements may suggest ACS but many other  chronic and acute conditions are known to elevate hsTnI results.  Refer to the "Links" section for chest pain algorithms and additional  guidance. Performed at [facility], [address] [place], [place] [address]   Pregnancy, urine     Status: None   Collection Time: 11/01/23  3:22 PM  Result Value Ref Range   Preg Test, Ur NEGATIVE NEGATIVE    Comment:        THE SENSITIVITY OF THIS METHODOLOGY IS >25 mIU/mL. Performed at [facility], [address] [place], [place] [address]   Resp panel by RT-PCR (RSV, Flu A&B, Covid) Anterior Nasal Swab     Status: None   Collection Time: 11/01/23  6:45 PM   Specimen: Anterior Nasal Swab  Result Value Ref Range   SARS Coronavirus 2  by RT PCR NEGATIVE NEGATIVE    Comment: (NOTE) SARS-CoV-2 target nucleic acids are NOT DETECTED.  The SARS-CoV-2 RNA is generally detectable in upper respiratory specimens during the acute phase of infection. The lowest concentration of SARS-CoV-2 viral copies this assay can detect is 138 copies/mL. A negative result does not preclude SARS-Cov-2 infection and should not be used as the sole basis for treatment or other patient management decisions. A negative result may occur with  improper specimen collection/handling, submission of specimen other than nasopharyngeal swab, presence of viral mutation(s) within the areas targeted by this assay, and inadequate number of viral copies(<138 copies/mL). A negative result must be combined with clinical observations, patient history, and epidemiological information. The expected result is Negative.  Fact Sheet for Patients:  BloggerCourse.com  Fact Sheet for Healthcare Providers:  SeriousBroker.it  This test is no t yet approved or cleared by the [place]  FDA and  has been authorized for detection and/or diagnosis of SARS-CoV-2 by FDA under an Emergency Use Authorization (EUA). This EUA will remain  in effect (meaning this test can be used) for the duration of the COVID-19 declaration under Section 564(b)(1) of the Act, 21 U.S.C.section 360bbb-3(b)(1), unless the authorization is terminated  or revoked sooner.       Influenza A by PCR NEGATIVE NEGATIVE   Influenza B by PCR NEGATIVE NEGATIVE    Comment: (NOTE) The Xpert Xpress SARS-CoV-2/FLU/RSV plus assay is intended as an aid in the diagnosis of influenza from Nasopharyngeal swab specimens and should not be used as a sole basis for treatment. Nasal washings and aspirates are unacceptable for Xpert Xpress SARS-CoV-2/FLU/RSV testing.  Fact Sheet for Patients: BloggerCourse.com  Fact Sheet for Healthcare  Providers: SeriousBroker.it  This test is not yet approved or cleared by the [place]  FDA and has been authorized for detection and/or diagnosis of SARS-CoV-2 by FDA under an Emergency Use Authorization (EUA). This EUA will remain in effect (meaning this test can be  used) for the duration of the COVID-19 declaration under Section 564(b)(1) of the Act, 21 U.S.C. section 360bbb-3(b)(1), unless the authorization is terminated or revoked.     Resp Syncytial Virus by PCR NEGATIVE NEGATIVE    Comment: (NOTE) Fact Sheet for Patients: BloggerCourse.com  Fact Sheet for Healthcare Providers: SeriousBroker.it  This test is not yet approved or cleared by the [place]  FDA and has been authorized for detection and/or diagnosis of SARS-CoV-2 by FDA under an Emergency Use Authorization (EUA). This EUA will remain in effect (meaning this test can be used) for the duration of the COVID-19 declaration under Section 564(b)(1) of the Act, 21 U.S.C. section 360bbb-3(b)(1), unless the authorization is terminated or revoked.  Performed at [facility], [address] [place], [place] [address]   Troponin I (High Sensitivity)     Status: None   Collection Time: 11/01/23  7:30 PM  Result Value Ref Range   Troponin I (High Sensitivity) 4 <18 ng/L    Comment: (NOTE) Elevated high sensitivity troponin I (hsTnI) values and significant  changes across serial measurements may suggest ACS but many other  chronic and acute conditions are known to elevate hsTnI results.  Refer to the "Links" section for chest pain algorithms and additional  guidance. Performed at [facility], [address] [place], [place] [address]     Radiology: MM 3D DIAGNOSTIC MAMMOGRAM BILATERAL BREAST Result Date: 12/13/2023 CLINICAL DATA:  Patient reports diffuse intermittent throbbing and stabbing pain in the left breast  for 1 month. 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 Patient is due for annual exam. EXAM: DIGITAL DIAGNOSTIC BILATERAL MAMMOGRAM WITH TOMOSYNTHESIS AND CAD; ULTRASOUND LEFT BREAST LIMITED TECHNIQUE: Bilateral digital diagnostic mammography and breast tomosynthesis was performed. The images were evaluated with computer-aided detection. ; Targeted ultrasound examination of the left breast was performed. COMPARISON:  Previous exam(s). ACR Breast Density Category c: The breasts are heterogeneously dense, which may obscure small masses. FINDINGS: No suspicious mass, calcification, or other finding in either breast. At the time of my clinical exam, the patient endorses focal pain in the lower outer left breast. On physical exam, I do not appreciate a suspicious mass. Tenderness to palpation is noted. Targeted ultrasound of the lower outer left breast, at the patient indicated area of intermittent pain, was performed. No suspicious solid or cystic mass. A 1.0 x 0.4 x 0.7 cm oval hypoechoic mass with echogenic focus at the 5 o'clock position 4 cm from nipple corresponds to the biopsy proven fibroadenoma with biopsy marking clip. This is not significantly changed compared to 11/30/2017 when it measured 0.8 x 0.4 x. 0.5 cm. Several subcentimeter oval anechoic circumscribed cysts are noted. IMPRESSION: 1. No mammographic or sonographic abnormality at the patient indicated area of intermittent pain in the lower outer left breast. 2. No mammographic findings of malignancy either breast. RECOMMENDATION: Any further workup of the patient's symptoms should be based on the clinical assessment. I discussed with the patient that breast pain is a common condition and often resolves on its own without intervention. It can be exacerbated by hormonal changes, hormonal medications, caffeine , and weight changes. It can be  improved by wearing adequate support, over-the-counter pain medication, low-fat diet, exercise, and ice as needed. Topical medications containing diclofenac or lidocaine  may provide temporary pain relief. Studies have shown an improvement in cyclic pain with use of evening primrose oil and vitamin E. Recommend  routine annual screening mammogram in 1 year. The [employer] of [employer] recommends annual MRI and mammography in patients with an estimated lifetime risk of developing breast cancer greater than 20%. If patient's lifetime risk is less than 20%, given breast density, supplemental screening with abbreviated breast MRI could be considered. I have discussed the findings and recommendations with the patient. If applicable, a reminder letter will be sent to the patient regarding the next appointment. BI-RADS CATEGORY  2: Benign. Electronically Signed   By: Mercie Stalker M.D.   On: 12/13/2023 16:07   US  LIMITED ULTRASOUND INCLUDING AXILLA LEFT BREAST  Result Date: 12/13/2023 CLINICAL DATA:  Patient reports diffuse intermittent throbbing and stabbing pain in the left breast for 1 month. 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 Patient is due for annual exam. EXAM: DIGITAL DIAGNOSTIC BILATERAL MAMMOGRAM WITH TOMOSYNTHESIS AND CAD; ULTRASOUND LEFT BREAST LIMITED TECHNIQUE: Bilateral digital diagnostic mammography and breast tomosynthesis was performed. The images were evaluated with computer-aided detection. ; Targeted ultrasound examination of the left breast was performed. COMPARISON:  Previous exam(s). ACR Breast Density Category c: The breasts are heterogeneously dense, which may obscure small masses. FINDINGS: No suspicious mass, calcification, or other finding in either breast. At the time of my clinical exam, the patient endorses focal pain in the lower outer left breast. On physical exam, I do  not appreciate a suspicious mass. Tenderness to palpation is noted. Targeted ultrasound of the lower outer left breast, at the patient indicated area of intermittent pain, was performed. No suspicious solid or cystic mass. A 1.0 x 0.4 x 0.7 cm oval hypoechoic mass with echogenic focus at the 5 o'clock position 4 cm from nipple corresponds to the biopsy proven fibroadenoma with biopsy marking clip. This is not significantly changed compared to 11/30/2017 when it measured 0.8 x 0.4 x. 0.5 cm. Several subcentimeter oval anechoic circumscribed cysts are noted. IMPRESSION: 1. No mammographic or sonographic abnormality at the patient indicated area of intermittent pain in the lower outer left breast. 2. No mammographic findings of malignancy either breast. RECOMMENDATION: Any further workup of the patient's symptoms should be based on the clinical assessment. I discussed with the patient that breast pain is a common condition and often resolves on its own without intervention. It can be exacerbated by hormonal changes, hormonal medications, caffeine , and weight changes. It can be improved by wearing adequate support, over-the-counter pain medication, low-fat diet, exercise, and ice as needed. Topical medications containing diclofenac or lidocaine  may provide temporary pain relief. Studies have shown an improvement in cyclic pain with use of evening primrose oil and vitamin E. Recommend routine annual screening mammogram in 1 year. The [employer] recommends annual MRI and mammography in patients with an estimated lifetime risk of developing breast cancer greater than 20%. If patient's lifetime risk is less than 20%, given breast density, supplemental screening with abbreviated breast MRI could be considered. I have discussed the findings and recommendations with the patient. If applicable, a reminder letter will be sent to the patient regarding the next appointment. BI-RADS CATEGORY  2: Benign.  Electronically Signed   By: Mercie Stalker M.D.   On: 12/13/2023 16:07    No results found.  MM 3D DIAGNOSTIC MAMMOGRAM BILATERAL BREAST Result Date: 12/13/2023 CLINICAL DATA:  Patient reports diffuse intermittent throbbing and stabbing pain in the left breast for 1 month. 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 Patient is due for annual exam. EXAM: DIGITAL DIAGNOSTIC BILATERAL MAMMOGRAM WITH TOMOSYNTHESIS AND CAD; ULTRASOUND LEFT BREAST LIMITED TECHNIQUE: Bilateral digital diagnostic mammography and breast tomosynthesis was performed. The images were evaluated with computer-aided detection. ; Targeted ultrasound examination of the left breast was performed. COMPARISON:  Previous exam(s). ACR Breast Density Category c: The breasts are heterogeneously dense, which may obscure small masses. FINDINGS: No suspicious mass, calcification, or other finding in either breast. At the time of my clinical exam, the patient endorses focal pain in the lower outer left breast. On physical exam, I do not appreciate a suspicious mass. Tenderness to palpation is noted. Targeted ultrasound of the lower outer left breast, at the patient indicated area of intermittent pain, was performed. No suspicious solid or cystic mass. A 1.0 x 0.4 x 0.7 cm oval hypoechoic mass with echogenic focus at the 5 o'clock position 4 cm from nipple corresponds to the biopsy proven fibroadenoma with biopsy marking clip. This is not significantly changed compared to 11/30/2017 when it measured 0.8 x 0.4 x. 0.5 cm. Several subcentimeter oval anechoic circumscribed cysts are noted. IMPRESSION: 1. No mammographic or sonographic abnormality at the patient indicated area of intermittent pain in the lower outer left breast. 2. No mammographic findings of malignancy either breast. RECOMMENDATION: Any further workup of the patient's symptoms should be  based on the clinical assessment. I discussed with the patient that breast pain is a common condition and often resolves on its own without intervention. It can be exacerbated by hormonal changes, hormonal medications, caffeine , and weight changes. It can be improved by wearing adequate support, over-the-counter pain medication, low-fat diet, exercise, and ice as needed. Topical medications containing diclofenac or lidocaine  may provide temporary pain relief. Studies have shown an improvement in cyclic pain with use of evening primrose oil and vitamin E. Recommend routine annual screening mammogram in 1 year. The [employer] recommends annual MRI and mammography in patients with an estimated lifetime risk of developing breast cancer greater than 20%. If patient's lifetime risk is less than 20%, given breast density, supplemental screening with abbreviated breast MRI could be considered. I have discussed the findings and recommendations with the patient. If applicable, a reminder letter will be sent to the patient regarding the next appointment. BI-RADS CATEGORY  2: Benign. Electronically Signed   By: Mercie Stalker M.D.   On: 12/13/2023 16:07   US  LIMITED ULTRASOUND INCLUDING AXILLA LEFT BREAST  Result Date: 12/13/2023 CLINICAL DATA:  Patient reports diffuse intermittent throbbing and stabbing pain in the left breast for 1 month. 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 Patient is due for annual exam. EXAM: DIGITAL DIAGNOSTIC BILATERAL MAMMOGRAM WITH TOMOSYNTHESIS AND CAD; ULTRASOUND LEFT BREAST LIMITED TECHNIQUE: Bilateral digital diagnostic mammography and breast tomosynthesis was performed. The images were evaluated with computer-aided detection. ; Targeted ultrasound examination of the left breast was performed. COMPARISON:  Previous exam(s). ACR Breast Density Category c: The breasts  are heterogeneously dense, which may obscure small masses. FINDINGS: No suspicious mass, calcification, or other finding in either breast. At the time of my clinical exam, the patient endorses focal pain in the lower outer left breast. On physical exam, I do not appreciate a suspicious mass. Tenderness to palpation is noted. Targeted ultrasound of the lower outer left breast, at the patient indicated  area of intermittent pain, was performed. No suspicious solid or cystic mass. A 1.0 x 0.4 x 0.7 cm oval hypoechoic mass with echogenic focus at the 5 o'clock position 4 cm from nipple corresponds to the biopsy proven fibroadenoma with biopsy marking clip. This is not significantly changed compared to 11/30/2017 when it measured 0.8 x 0.4 x. 0.5 cm. Several subcentimeter oval anechoic circumscribed cysts are noted. IMPRESSION: 1. No mammographic or sonographic abnormality at the patient indicated area of intermittent pain in the lower outer left breast. 2. No mammographic findings of malignancy either breast. RECOMMENDATION: Any further workup of the patient's symptoms should be based on the clinical assessment. I discussed with the patient that breast pain is a common condition and often resolves on its own without intervention. It can be exacerbated by hormonal changes, hormonal medications, caffeine , and weight changes. It can be improved by wearing adequate support, over-the-counter pain medication, low-fat diet, exercise, and ice as needed. Topical medications containing diclofenac or lidocaine  may provide temporary pain relief. Studies have shown an improvement in cyclic pain with use of evening primrose oil and vitamin E. Recommend routine annual screening mammogram in 1 year. The [employer] recommends annual MRI and mammography in patients with an estimated lifetime risk of developing breast cancer greater than 20%. If patient's lifetime risk is less than 20%, given breast density, supplemental  screening with abbreviated breast MRI could be considered. I have discussed the findings and recommendations with the patient. If applicable, a reminder letter will be sent to the patient regarding the next appointment. BI-RADS CATEGORY  2: Benign. Electronically Signed   By: Mercie Stalker M.D.   On: 12/13/2023 16:07      Assessment and Plan: Patient Active Problem List   Diagnosis Date Noted   Submucous leiomyoma of uterus 11/10/2023   Bulky or enlarged uterus 11/10/2023   Pelvic pressure in female 11/10/2023   Hemorrhagic ovarian cyst 11/10/2023   Menorrhagia with regular cycle 11/10/2023   OSA (obstructive sleep apnea) 10/18/2023   CPAP use counseling 10/18/2023   Obesity (BMI 30-39.9) 10/18/2023   Microcytic anemia 06/22/2023   Herpes zoster dermatitis with ophthalmic complication 06/11/2021   Shingles 06/11/2021   Herpes zoster ophthalmicus of left eye    LFT elevation    Hypothyroidism    Morbid obesity (HCC)    Asthma    COVID-19 virus infection 08/2020   Nipple discharge 12/03/2015   Abnormal mammogram of left breast 12/03/2015   1. OSA (obstructive sleep apnea) (Primary) The patient does tolerate PAP and reports  benefit from PAP use. She is not compliant. She will work on increasing her usage.  The patient was reminded how to clean equipment and advised to replace supplies routinely. The patient was also counselled on weight loss. The compliance is poor. The AHI is 1,9.   OSA on cpap- controlled but not compliant. Increase compliance. F/u 6wk.   2. CPAP use counseling CPAP Counseling: had a lengthy discussion with the patient regarding the importance of PAP therapy in management of the sleep apnea. Patient appears to understand the risk factor reduction and also understands the risks associated with untreated sleep apnea. Patient will try to make a good faith effort to remain compliant with therapy. Also instructed the patient on proper cleaning of the device including the  water  must be changed daily if possible and use of distilled water  is preferred. Patient understands that the machine should be regularly cleaned with appropriate recommended cleaning solutions that  do not damage the PAP machine for example given white vinegar and water  rinses. Other methods such as ozone treatment may not be as good as these simple methods to achieve cleaning.   3. Obesity (BMI 30-39.9) Obesity Counseling: Had a lengthy discussion regarding patients BMI and weight issues. Patient was instructed on portion control as well as increased activity. Also discussed caloric restrictions with trying to maintain intake less than 2000 Kcal. Discussions were made in accordance with the 5As of weight management. Simple actions such as not eating late and if able to, taking a walk is suggested.      General Counseling: I have discussed the findings of the evaluation and examination with [facility].  I have also discussed any further diagnostic evaluation thatmay be needed or ordered today. Noga verbalizes understanding of the findings of todays visit. We also reviewed her medications today and discussed drug interactions and side effects including but not limited excessive drowsiness and altered mental states. We also discussed that there is always a risk not just to her but also people around her. she has been encouraged to call the office with any questions or concerns that should arise related to todays visit.  No orders of the defined types were placed in this encounter.       I have personally obtained a history, examined the patient, evaluated laboratory and imaging results, formulated the assessment and plan and placed orders. This patient was seen today by Louann Rous, PA-C in collaboration with Dr. Cam Cava.   Cordie Deters, MD [facility] Diplomate ABMS Pulmonary Critical Care Medicine and Sleep Medicine

## 2023-12-27 ENCOUNTER — Ambulatory Visit (INDEPENDENT_AMBULATORY_CARE_PROVIDER_SITE_OTHER): Admitting: Internal Medicine

## 2023-12-27 VITALS — BP 119/84 | HR 71 | Resp 16 | Ht 65.0 in | Wt 231.0 lb

## 2023-12-27 DIAGNOSIS — G4733 Obstructive sleep apnea (adult) (pediatric): Secondary | ICD-10-CM | POA: Diagnosis not present

## 2023-12-27 DIAGNOSIS — Z7189 Other specified counseling: Secondary | ICD-10-CM | POA: Diagnosis not present

## 2023-12-27 DIAGNOSIS — E669 Obesity, unspecified: Secondary | ICD-10-CM | POA: Diagnosis not present

## 2023-12-27 NOTE — Patient Instructions (Signed)

## 2023-12-30 ENCOUNTER — Encounter (INDEPENDENT_AMBULATORY_CARE_PROVIDER_SITE_OTHER): Payer: Self-pay | Admitting: Vascular Surgery

## 2023-12-30 ENCOUNTER — Ambulatory Visit (INDEPENDENT_AMBULATORY_CARE_PROVIDER_SITE_OTHER): Payer: Self-pay | Admitting: Vascular Surgery

## 2023-12-30 VITALS — BP 131/89 | HR 74 | Resp 16 | Wt 230.2 lb

## 2023-12-30 DIAGNOSIS — D250 Submucous leiomyoma of uterus: Secondary | ICD-10-CM

## 2023-12-30 DIAGNOSIS — N920 Excessive and frequent menstruation with regular cycle: Secondary | ICD-10-CM | POA: Diagnosis not present

## 2023-12-30 NOTE — Progress Notes (Unsigned)
 MRN : [id number]  Megan [name] is a 53 y.o. (05-09-1971) female who presents with chief complaint of check circulation.  History of Present Illness:   Location: Pelvic pain associated with her menstrual cycle Character/quality of the symptom: Severe cramping Severity: This is quite severe and is interrupting her daily activities Duration: Ongoing for many months Timing/onset: Associated with her menstrual cycle Aggravating/context: Occurring routinely with her menses  Relieving/modifying: None  Duplex ultrasound of the pelvis transabdominal and transvaginal shows: Uterus   Measurements: 15 x 10 x 12 cm. = volume: 938 mL. Multiple fibroids are noted. The largest of these measures 5.5 cm.  No outpatient medications have been marked as taking for the 12/30/23 encounter (Appointment) with Prescilla Brod, Ninette Basque, MD.    Past Medical History:  Diagnosis Date   Anxiety    Asthma    COVID-19 virus infection 08/2020   Hypothyroidism    Morbid obesity (HCC)     Past Surgical History:  Procedure Laterality Date   BREAST BIOPSY Right 11/11/2020   us  bx, vision marker, - FRAGMENTS OF BENIGN INTRAMAMMARY LYMPH NODE.  NEGATIVE FOR ATYPICAL PROLIFERATIVE BREAST DISEASE.   BREAST BIOPSY Left 11/11/2020`   us  bx, Q shape, FIBROEPITHELIAL PROLIFERATION WITH SCLEROSIS. NEGATIVE FOR ATYPICAL PROLIFERATIVE BREAST DISEASE IN THIS SAMPLE.    BREAST BIOPSY Left 12/03/2020   us  bx, vision clip, fiboadenoma   BREAST BIOPSY Left 12/03/2020   us  bx, x-clip, fibroadenoma   BREAST BIOPSY Left 02/03/2021   Procedure: BREAST BIOPSY WITH NEEDLE LOCALIZATION;  Surgeon: Marshall Skeeter, MD;  Location: [facility] ORS;  Service: General;  Laterality: Left;   BREAST BIOPSY Right 01/21/2022   Us  core bx right breast ribbon clip path pending   BREAST BIOPSY Right 04/06/2022   Procedure: BREAST BIOPSY;  Surgeon: Marshall Skeeter, MD;  Location:  [facility] ORS;  Service: General;  Laterality: Right;   BREAST LUMPECTOMY Left 02/03/2021   LT 2:00 X clip NL done prior to lumpectomy    CESAREAN SECTION     COLONOSCOPY WITH PROPOFOL  N/A 09/22/2023   Procedure: COLONOSCOPY WITH PROPOFOL ;  Surgeon: Marnee Sink, MD;  Location: [facility] END[facility];  Service: Endoscopy;  Laterality: N/A;   ESOPHAGOGASTRODUODENOSCOPY (EGD) WITH PROPOFOL  N/A 09/22/2023   Procedure: ESOPHAGOGASTRODUODENOSCOPY (EGD) WITH PROPOFOL ;  Surgeon: Marnee Sink, MD;  Location: [facility] ENDOSCOPY;  Service: Endoscopy;  Laterality: N/A;   FOOT SURGERY     bone spur   TUBAL LIGATION      Social History Social History   Tobacco Use   Smoking status: Never   Smokeless tobacco: Never  Vaping Use   Vaping status: Never Used  Substance Use Topics   Alcohol use: No   Drug use: No    Family History Family History  Problem Relation Age of Onset   Thyroid disease Mother    Hypertension Mother    Diabetes Mother    Rheum arthritis Mother    Breast cancer Maternal Grandmother        great grandmother   Cancer Paternal Grandmother        Pancreatic   Ovarian cancer  Other 55    No Known Allergies   REVIEW OF SYSTEMS (Negative unless checked)  Constitutional: [] Weight loss  [] Fever  [] Chills Cardiac: [] Chest pain   [] Chest pressure   [] Palpitations   [] Shortness of breath when laying flat   [] Shortness of breath with exertion. Vascular:  [x] Pain in legs with walking   [] Pain in legs at rest  [] History of DVT   [] Phlebitis   [] Swelling in legs   [] Varicose veins   [] Non-healing ulcers Pulmonary:   [] Uses home oxygen   [] Productive cough   [] Hemoptysis   [] Wheeze  [] COPD   [] Asthma Neurologic:  [] Dizziness   [] Seizures   [] History of stroke   [] History of TIA  [] Aphasia   [] Vissual changes   [] Weakness or numbness in arm   [] Weakness or numbness in leg Musculoskeletal:   [] Joint swelling   [] Joint pain   [] Low back pain Hematologic:  [] Easy bruising  [] Easy bleeding    [] Hypercoagulable state   [] Anemic Gastrointestinal:  [] Diarrhea   [] Vomiting  [] Gastroesophageal reflux/heartburn   [] Difficulty swallowing. Genitourinary:  [] Chronic kidney disease   [] Difficult urination  [] Frequent urination   [] Blood in urine Skin:  [] Rashes   [] Ulcers  Psychological:  [] History of anxiety   []  History of major depression.  Physical Examination  There were no vitals filed for this visit. There is no height or weight on file to calculate BMI. Gen: WD/WN, NAD Head: [place]/AT, No temporalis wasting.  Ear/Nose/Throat: Hearing grossly intact, nares w/o erythema or drainage Eyes: PER, EOMI, sclera nonicteric.  Neck: Supple, no masses.  No bruit or JVD.  Pulmonary:  Good air movement, no audible wheezing, no use of accessory muscles.  Cardiac: RRR, normal S1, S2, no Murmurs. Vascular:   Vessel Right Left  Radial Palpable Palpable  Gastrointestinal: soft, non-distended. No guarding/no peritoneal signs.  Musculoskeletal: M/S 5/5 throughout.  No visible deformity.  Neurologic: CN 2-12 intact. Pain and light touch intact in extremities.  Symmetrical.  Speech is fluent. Motor exam as listed above. Psychiatric: Judgment intact, Mood & affect appropriate for pt's clinical situation. Dermatologic: No rashes or ulcers noted.  No changes consistent with cellulitis.   CBC Lab Results  Component Value Date   WBC 4.5 11/01/2023   HGB 12.5 11/01/2023   HCT 38.6 11/01/2023   MCV 89.6 11/01/2023   PLT 237 11/01/2023    BMET    Component Value Date/Time   NA 138 11/01/2023 1522   NA 138 09/07/2013 1337   K 3.7 11/01/2023 1522   K 4.1 09/07/2013 1337   CL 107 11/01/2023 1522   CL 106 09/07/2013 1337   CO2 25 11/01/2023 1522   CO2 28 09/07/2013 1337   GLUCOSE 110 (H) 11/01/2023 1522   GLUCOSE 86 09/07/2013 1337   BUN 9 11/01/2023 1522   BUN 10 09/07/2013 1337   CREATININE 1.08 (H) 11/01/2023 1522   CREATININE 0.96 09/07/2013 1337   CALCIUM 9.3 11/01/2023 1522   CALCIUM  9.1 09/07/2013 1337   GFRNONAA >60 11/01/2023 1522   GFRNONAA >60 09/07/2013 1337   GFRAA >60 09/29/2015 1534   GFRAA >60 09/07/2013 1337   CrCl cannot be calculated (Patient's most recent lab result is older than the maximum 21 days allowed.).  COAG Lab Results  Component Value Date   INR 1.0 06/07/2021    Radiology MM 3D DIAGNOSTIC MAMMOGRAM BILATERAL BREAST Result Date: 12/13/2023 CLINICAL DATA:  Patient reports diffuse intermittent throbbing and stabbing pain in the left breast for 1 month. History  of bilateral benign surgical biopsies and bilateral benign core needle biopsies including a fibroadenoma at the left breast 5 o'clock position. Patient is due for annual exam. EXAM: DIGITAL DIAGNOSTIC BILATERAL MAMMOGRAM WITH TOMOSYNTHESIS AND CAD; ULTRASOUND LEFT BREAST LIMITED TECHNIQUE: Bilateral digital diagnostic mammography and breast tomosynthesis was performed. The images were evaluated with computer-aided detection. ; Targeted ultrasound examination of the left breast was performed. COMPARISON:  Previous exam(s). ACR Breast Density Category c: The breasts are heterogeneously dense, which may obscure small masses. FINDINGS: No suspicious mass, calcification, or other finding in either breast. At the time of my clinical exam, the patient endorses focal pain in the lower outer left breast. On physical exam, I do not appreciate a suspicious mass. Tenderness to palpation is noted. Targeted ultrasound of the lower outer left breast, at the patient indicated area of intermittent pain, was performed. No suspicious solid or cystic mass. A 1.0 x 0.4 x 0.7 cm oval hypoechoic mass with echogenic focus at the 5 o'clock position 4 cm from nipple corresponds to the biopsy proven fibroadenoma with biopsy marking clip. This is not significantly changed compared to 11/30/2017 when it measured 0.8 x 0.4 x. 0.5 cm. Several subcentimeter oval anechoic circumscribed cysts are noted. IMPRESSION: 1. No mammographic  or sonographic abnormality at the patient indicated area of intermittent pain in the lower outer left breast. 2. No mammographic findings of malignancy either breast. RECOMMENDATION: Any further workup of the patient's symptoms should be based on the clinical assessment. I discussed with the patient that breast pain is a common condition and often resolves on its own without intervention. It can be exacerbated by hormonal changes, hormonal medications, caffeine , and weight changes. It can be improved by wearing adequate support, over-the-counter pain medication, low-fat diet, exercise, and ice as needed. Topical medications containing diclofenac or lidocaine  may provide temporary pain relief. Studies have shown an improvement in cyclic pain with use of evening primrose oil and vitamin E. Recommend routine annual screening mammogram in 1 year. The [employer] recommends annual MRI and mammography in patients with an estimated lifetime risk of developing breast cancer greater than 20%. If patient's lifetime risk is less than 20%, given breast density, supplemental screening with abbreviated breast MRI could be considered. I have discussed the findings and recommendations with the patient. If applicable, a reminder letter will be sent to the patient regarding the next appointment. BI-RADS CATEGORY  2: Benign. Electronically Signed   By: Mercie Stalker M.D.   On: 12/13/2023 16:07   US  LIMITED ULTRASOUND INCLUDING AXILLA LEFT BREAST  Result Date: 12/13/2023 CLINICAL DATA:  Patient reports diffuse intermittent throbbing and stabbing pain in the left breast for 1 month. 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 Patient is due for annual exam. EXAM: DIGITAL DIAGNOSTIC BILATERAL MAMMOGRAM WITH TOMOSYNTHESIS AND CAD; ULTRASOUND LEFT BREAST LIMITED TECHNIQUE: Bilateral digital diagnostic mammography  and breast tomosynthesis was performed. The images were evaluated with computer-aided detection. ; Targeted ultrasound examination of the left breast was performed. COMPARISON:  Previous exam(s). ACR Breast Density Category c: The breasts are heterogeneously dense, which may obscure small masses. FINDINGS: No suspicious mass, calcification, or other finding in either breast. At the time of my clinical exam, the patient endorses focal pain in the lower outer left breast. On physical exam, I do not appreciate a suspicious mass. Tenderness to palpation is noted. Targeted ultrasound of the lower outer  left breast, at the patient indicated area of intermittent pain, was performed. No suspicious solid or cystic mass. A 1.0 x 0.4 x 0.7 cm oval hypoechoic mass with echogenic focus at the 5 o'clock position 4 cm from nipple corresponds to the biopsy proven fibroadenoma with biopsy marking clip. This is not significantly changed compared to 11/30/2017 when it measured 0.8 x 0.4 x. 0.5 cm. Several subcentimeter oval anechoic circumscribed cysts are noted. IMPRESSION: 1. No mammographic or sonographic abnormality at the patient indicated area of intermittent pain in the lower outer left breast. 2. No mammographic findings of malignancy either breast. RECOMMENDATION: Any further workup of the patient's symptoms should be based on the clinical assessment. I discussed with the patient that breast pain is a common condition and often resolves on its own without intervention. It can be exacerbated by hormonal changes, hormonal medications, caffeine , and weight changes. It can be improved by wearing adequate support, over-the-counter pain medication, low-fat diet, exercise, and ice as needed. Topical medications containing diclofenac or lidocaine  may provide temporary pain relief. Studies have shown an improvement in cyclic pain with use of evening primrose oil and vitamin E. Recommend routine annual screening mammogram in 1 year.  The [employer] recommends annual MRI and mammography in patients with an estimated lifetime risk of developing breast cancer greater than 20%. If patient's lifetime risk is less than 20%, given breast density, supplemental screening with abbreviated breast MRI could be considered. I have discussed the findings and recommendations with the patient. If applicable, a reminder letter will be sent to the patient regarding the next appointment. BI-RADS CATEGORY  2: Benign. Electronically Signed   By: Mercie Stalker M.D.   On: 12/13/2023 16:07     Assessment/Plan 1. Submucous leiomyoma of uterus (Primary) The patient came to the office inquiring about embolization of the uterine fibroids versus surgical treatment such as hysterectomy.  We did talk at length about some of the differences although I did defer most of the discussion regarding surgical options to a GYN referral.  I did discuss the risks and benefits of embolization of uterine fibroids.  We had a very thorough discussion and I answered all of her questions.  At the end she requested a referral to [facility] clinic because they are affiliated with [facility].  I will place a referral to Dr. Cleora Daft and she can decide who she would like to follow-up with.  This was at her request.  She will follow-up with me after she has had an office visit with a GYN surgeon. - Ambulatory referral to Gynecology  2. Menorrhagia with regular cycle The patient came to the office inquiring about embolization of the uterine fibroids versus surgical treatment such as hysterectomy.  We did talk at length about some of the differences although I did defer most of the discussion regarding surgical options to a GYN referral.  I did discuss the risks and benefits of embolization of uterine fibroids.  We had a very thorough discussion and I answered all of her questions.  At the end she requested a referral to [facility] clinic because they are affiliated with [facility].  I  will place a referral to Dr. Cleora Daft and she can decide who she would like to follow-up with.  This was at her request.  She will follow-up with me after she has had an office visit with a GYN surgeon. - Ambulatory referral to Gynecology d   Devon Fogo, MD  12/30/2023 12:05 PM

## 2024-01-01 ENCOUNTER — Encounter (INDEPENDENT_AMBULATORY_CARE_PROVIDER_SITE_OTHER): Payer: Self-pay | Admitting: Vascular Surgery

## 2024-01-10 ENCOUNTER — Telehealth: Payer: Self-pay | Admitting: Obstetrics

## 2024-01-10 NOTE — Telephone Encounter (Signed)
 Patient called this morning stating that she will do the procedure and wants to know the next steps.  Please call her with those steps.  Thanks, CharleneJ

## 2024-01-11 NOTE — Telephone Encounter (Signed)
 Sent MyChart msg to Dr. Prescilla Brod of [facility] re: pt's prior consult on 5/1 and her expressing desire for UFE/UAE today. Will send new referral if needed.

## 2024-01-18 ENCOUNTER — Encounter (INDEPENDENT_AMBULATORY_CARE_PROVIDER_SITE_OTHER): Payer: Self-pay

## 2024-01-25 ENCOUNTER — Ambulatory Visit: Admitting: Obstetrics

## 2024-01-29 DIAGNOSIS — D259 Leiomyoma of uterus, unspecified: Secondary | ICD-10-CM | POA: Insufficient documentation

## 2024-01-29 NOTE — Progress Notes (Deleted)
 MRN : [id number]  Zeta Bucy Liese is a 53 y.o. (12-13-1970) female who presents with chief complaint of check circulation.  History of Present Illness:  Location: Pelvic pain associated with her menstrual cycle Character/quality of the symptom: Severe cramping Severity: This is quite severe and is interrupting her daily activities Duration: Ongoing for many months Timing/onset: Associated with her menstrual cycle Aggravating/context: Occurring routinely with her menses  Relieving/modifying: None   Duplex ultrasound of the pelvis transabdominal and transvaginal shows: Uterus   Measurements: 15 x 10 x 12 cm. = volume: 938 mL. Multiple fibroids are noted. The largest of these measures 5.5 cm.   No outpatient medications have been marked as taking for the 01/31/24 encounter (Appointment) with Prescilla Brod, Ninette Basque, MD.    Past Medical History:  Diagnosis Date   Anxiety    Asthma    COVID-19 virus infection 08/2020   Hypothyroidism    Morbid obesity (HCC)     Past Surgical History:  Procedure Laterality Date   BREAST BIOPSY Right 11/11/2020   us  bx, vision marker, - FRAGMENTS OF BENIGN INTRAMAMMARY LYMPH NODE.  NEGATIVE FOR ATYPICAL PROLIFERATIVE BREAST DISEASE.   BREAST BIOPSY Left 11/11/2020`   us  bx, Q shape, FIBROEPITHELIAL PROLIFERATION WITH SCLEROSIS. NEGATIVE FOR ATYPICAL PROLIFERATIVE BREAST DISEASE IN THIS SAMPLE.    BREAST BIOPSY Left 12/03/2020   us  bx, vision clip, fiboadenoma   BREAST BIOPSY Left 12/03/2020   us  bx, x-clip, fibroadenoma   BREAST BIOPSY Left 02/03/2021   Procedure: BREAST BIOPSY WITH NEEDLE LOCALIZATION;  Surgeon: Marshall Skeeter, MD;  Location: [facility];  Service: General;  Laterality: Left;   BREAST BIOPSY Right 01/21/2022   Us  core bx right breast ribbon clip path pending   BREAST BIOPSY Right 04/06/2022   Procedure: BREAST BIOPSY;  Surgeon: Marshall Skeeter, MD;   Location: [facility];  Service: General;  Laterality: Right;   BREAST LUMPECTOMY Left 02/03/2021   LT 2:00 X clip NL done prior to lumpectomy    CESAREAN SECTION     COLONOSCOPY WITH PROPOFOL  N/A 09/22/2023   Procedure: COLONOSCOPY WITH PROPOFOL ;  Surgeon: Marnee Sink, MD;  Location: [facility];  Service: Endoscopy;  Laterality: N/A;   ESOPHAGOGASTRODUODENOSCOPY (EGD) WITH PROPOFOL  N/A 09/22/2023   Procedure: ESOPHAGOGASTRODUODENOSCOPY (EGD) WITH PROPOFOL ;  Surgeon: Marnee Sink, MD;  Location: [facility];  Service: Endoscopy;  Laterality: N/A;   FOOT SURGERY     bone spur   TUBAL LIGATION      Social History Social History   Tobacco Use   Smoking status: Never   Smokeless tobacco: Never  Vaping Use   Vaping status: Never Used  Substance Use Topics   Alcohol use: No   Drug use: No    Family History Family History  Problem Relation Age of Onset   Thyroid disease Mother    Hypertension Mother    Diabetes Mother    Rheum arthritis Mother    Breast cancer Maternal Grandmother        great grandmother   Cancer Paternal Grandmother        Pancreatic   Ovarian  cancer Other 55    No Known Allergies   REVIEW OF SYSTEMS (Negative unless checked)  Constitutional: [] Weight loss  [] Fever  [] Chills Cardiac: [] Chest pain   [] Chest pressure   [] Palpitations   [] Shortness of breath when laying flat   [] Shortness of breath with exertion. Vascular:  [x] Pain in legs with walking   [] Pain in legs at rest  [] History of DVT   [] Phlebitis   [] Swelling in legs   [] Varicose veins   [] Non-healing ulcers Pulmonary:   [] Uses home oxygen   [] Productive cough   [] Hemoptysis   [] Wheeze  [] COPD   [] Asthma Neurologic:  [] Dizziness   [] Seizures   [] History of stroke   [] History of TIA  [] Aphasia   [] Vissual changes   [] Weakness or numbness in arm   [] Weakness or numbness in leg Musculoskeletal:   [] Joint swelling   [] Joint pain   [] Low back pain Hematologic:  [] Easy bruising  [] Easy bleeding    [] Hypercoagulable state   [] Anemic Gastrointestinal:  [] Diarrhea   [] Vomiting  [] Gastroesophageal reflux/heartburn   [] Difficulty swallowing. Genitourinary:  [] Chronic kidney disease   [] Difficult urination  [] Frequent urination   [] Blood in urine Skin:  [] Rashes   [] Ulcers  Psychological:  [] History of anxiety   []  History of major depression.  Physical Examination  There were no vitals filed for this visit. There is no height or weight on file to calculate BMI. Gen: WD/WN, NAD Head: [place]/AT, No temporalis wasting.  Ear/Nose/Throat: Hearing grossly intact, nares w/o erythema or drainage Eyes: PER, EOMI, sclera nonicteric.  Neck: Supple, no masses.  No bruit or JVD.  Pulmonary:  Good air movement, no audible wheezing, no use of accessory muscles.  Cardiac: RRR, normal S1, S2, no Murmurs. Vascular:  mild trophic changes, no open wounds Vessel Right Left  Radial Palpable Palpable  PT Not Palpable Not Palpable  DP Not Palpable Not Palpable  Gastrointestinal: soft, non-distended. No guarding/no peritoneal signs.  Musculoskeletal: M/S 5/5 throughout.  No visible deformity.  Neurologic: CN 2-12 intact. Pain and light touch intact in extremities.  Symmetrical.  Speech is fluent. Motor exam as listed above. Psychiatric: Judgment intact, Mood & affect appropriate for pt's clinical situation. Dermatologic: No rashes or ulcers noted.  No changes consistent with cellulitis.   CBC Lab Results  Component Value Date   WBC 4.5 11/01/2023   HGB 12.5 11/01/2023   HCT 38.6 11/01/2023   MCV 89.6 11/01/2023   PLT 237 11/01/2023    BMET    Component Value Date/Time   NA 138 11/01/2023 1522   NA 138 09/07/2013 1337   K 3.7 11/01/2023 1522   K 4.1 09/07/2013 1337   CL 107 11/01/2023 1522   CL 106 09/07/2013 1337   CO2 25 11/01/2023 1522   CO2 28 09/07/2013 1337   GLUCOSE 110 (H) 11/01/2023 1522   GLUCOSE 86 09/07/2013 1337   BUN 9 11/01/2023 1522   BUN 10 09/07/2013 1337   CREATININE 1.08  (H) 11/01/2023 1522   CREATININE 0.96 09/07/2013 1337   CALCIUM 9.3 11/01/2023 1522   CALCIUM 9.1 09/07/2013 1337   GFRNONAA >60 11/01/2023 1522   GFRNONAA >60 09/07/2013 1337   GFRAA >60 09/29/2015 1534   GFRAA >60 09/07/2013 1337   CrCl cannot be calculated (Patient's most recent lab result is older than the maximum 21 days allowed.).  COAG Lab Results  Component Value Date   INR 1.0 06/07/2021    Radiology No results found.   Assessment/Plan There are no diagnoses  linked to this encounter.   Devon Fogo, MD  01/29/2024 3:54 PM

## 2024-01-31 ENCOUNTER — Ambulatory Visit (INDEPENDENT_AMBULATORY_CARE_PROVIDER_SITE_OTHER): Admitting: Vascular Surgery

## 2024-01-31 ENCOUNTER — Telehealth: Payer: Self-pay

## 2024-01-31 DIAGNOSIS — J4520 Mild intermittent asthma, uncomplicated: Secondary | ICD-10-CM

## 2024-01-31 DIAGNOSIS — D259 Leiomyoma of uterus, unspecified: Secondary | ICD-10-CM

## 2024-01-31 DIAGNOSIS — N920 Excessive and frequent menstruation with regular cycle: Secondary | ICD-10-CM

## 2024-01-31 NOTE — Telephone Encounter (Signed)
 Patient contacted office triage line and left message stating that she would like a referral to see Dr. Cleora Daft.KW

## 2024-01-31 NOTE — Telephone Encounter (Signed)
 Left message for patient to call back, need for information as to what specialty or department Dr. Cleora Daft is located in an reason for referral. If patient returns call please triage. KW

## 2024-02-14 ENCOUNTER — Encounter: Payer: Self-pay | Admitting: Oncology

## 2024-02-18 NOTE — Progress Notes (Unsigned)
 [facility] [address] [place], [place] [address]  Pulmonary Sleep Medicine   Office Visit Note  Patient Name: Megan [name] DOB: [birth year] MRN [id number]    Chief Complaint: Obstructive Sleep Apnea visit  Brief History:  Martasia is seen today for a follow up visit The patient has a 1 year history of sleep apnea. Patient is using PAP nightly.  The patient feels rested after sleeping with PAP.  The patient reports benefiting from PAP use. Reported sleepiness is  improved and the Epworth Sleepiness Score is *** out of 24. The patient *** take naps. The patient complains of the following: ***  The compliance download shows 70% compliance with an average use time of 5 hours 38 minutes. The AHI is 1.5.  The patient *** of limb movements disrupting sleep. The patient continues to require PAP therapy in order to eliminate sleep apnea.   ROS  General: (-) fever, (-) chills, (-) night sweat Nose and Sinuses: (-) nasal stuffiness or itchiness, (-) postnasal drip, (-) nosebleeds, (-) sinus trouble. Mouth and Throat: (-) sore throat, (-) hoarseness. Neck: (-) swollen glands, (-) enlarged thyroid, (-) neck pain. Respiratory: *** cough, *** shortness of breath, *** wheezing. Neurologic: *** numbness, *** tingling. Psychiatric: *** anxiety, *** depression   Current Medication: Outpatient Encounter Medications as of 02/21/2024  Medication Sig   ascorbic acid  (VITAMIN C) 500 MG tablet Take 1 tablet (500 mg total) by mouth daily. (Patient not taking: Reported on 12/30/2023)   BIKTARVY 50-200-25 MG TABS tablet Take 1 tablet by mouth daily.   capsaicin (ZOSTRIX) 0.025 % cream    dexamethasone  (DECADRON ) 0.1 % ophthalmic suspension Place 2 drops into both eyes every 4 (four) hours.   docusate (COLACE) 50 MG/5ML liquid Take 10 mLs (100 mg total) by mouth 2 (two) times daily as needed for mild constipation.   feeding supplement (ENSURE ENLIVE / ENSURE PLUS) LIQD Take 237 mLs by mouth  3 (three) times daily between meals. (Patient not taking: Reported on 12/30/2023)   Ferrous Sulfate (IRON ) 325 (65 Fe) MG TABS Take 1 tablet by mouth daily.   HYDROcodone -acetaminophen  (NORCO/VICODIN) 5-325 MG tablet Take 2 tablets by mouth every 6 (six) hours as needed for moderate pain (pain score 4-6) or severe pain (pain score 7-10).   hydrocortisone 2.5 % cream Apply 1 Application topically once as needed.   iron  polysaccharides (NIFEREX) 150 MG capsule Take 1 capsule (150 mg total) by mouth daily.   levothyroxine  (SYNTHROID ) 88 MCG tablet Take 88 mcg by mouth daily before breakfast.   loratadine  (CLARITIN ) 10 MG tablet Take 10 mg by mouth daily as needed for allergies.   PARoxetine  (PAXIL ) 40 MG tablet Take 40 mg by mouth daily.   pregabalin (LYRICA) 100 MG capsule Take 100 mg by mouth 3 (three) times daily.   traZODone  (DESYREL ) 50 MG tablet Take 50 mg by mouth at bedtime as needed for sleep.   valACYclovir  (VALTREX ) 1000 MG tablet Take 1,000 mg by mouth 3 (three) times daily.   VENTOLIN  HFA 108 (90 Base) MCG/ACT inhaler Inhale into the lungs.   vitamin B-12 (CYANOCOBALAMIN ) 500 MCG tablet Take 1 tablet (500 mcg total) by mouth daily.   WEGOVY 2.4 MG/0.75ML SOAJ    No facility-administered encounter medications on file as of 02/21/2024.    Surgical History: Past Surgical History:  Procedure Laterality Date   BREAST BIOPSY Right 11/11/2020   us  bx, vision marker, - FRAGMENTS OF BENIGN INTRAMAMMARY LYMPH NODE.  NEGATIVE FOR ATYPICAL  PROLIFERATIVE BREAST DISEASE.   BREAST BIOPSY Left 11/11/2020`   us  bx, Q shape, FIBROEPITHELIAL PROLIFERATION WITH SCLEROSIS. NEGATIVE FOR ATYPICAL PROLIFERATIVE BREAST DISEASE IN THIS SAMPLE.    BREAST BIOPSY Left 12/03/2020   us  bx, vision clip, fiboadenoma   BREAST BIOPSY Left 12/03/2020   us  bx, x-clip, fibroadenoma   BREAST BIOPSY Left 02/03/2021   Procedure: BREAST BIOPSY WITH NEEDLE LOCALIZATION;  Surgeon: Marshall Skeeter, MD;  Location: [facility]  ORS;  Service: General;  Laterality: Left;   BREAST BIOPSY Right 01/21/2022   Us  core bx right breast ribbon clip path pending   BREAST BIOPSY Right 04/06/2022   Procedure: BREAST BIOPSY;  Surgeon: Marshall Skeeter, MD;  Location: [facility];  Service: General;  Laterality: Right;   BREAST LUMPECTOMY Left 02/03/2021   LT 2:00 X clip NL done prior to lumpectomy    CESAREAN SECTION     COLONOSCOPY WITH PROPOFOL  N/A 09/22/2023   Procedure: COLONOSCOPY WITH PROPOFOL ;  Surgeon: Marnee Sink, MD;  Location: [facility];  Service: Endoscopy;  Laterality: N/A;   ESOPHAGOGASTRODUODENOSCOPY (EGD) WITH PROPOFOL  N/A 09/22/2023   Procedure: ESOPHAGOGASTRODUODENOSCOPY (EGD) WITH PROPOFOL ;  Surgeon: Marnee Sink, MD;  Location: [facility];  Service: Endoscopy;  Laterality: N/A;   FOOT SURGERY     bone spur   TUBAL LIGATION      Medical History: Past Medical History:  Diagnosis Date   Anxiety    Asthma    COVID-19 virus infection 08/2020   Hypothyroidism    Morbid obesity (HCC)     Family History: Non contributory to the present illness  Social History: Social History   Socioeconomic History   Marital status: Single    Spouse name: Not on file   Number of children: Not on file   Years of education: Not on file   Highest education level: Not on file  Occupational History   Not on file  Tobacco Use   Smoking status: Never   Smokeless tobacco: Never  Vaping Use   Vaping status: Never Used  Substance and Sexual Activity   Alcohol use: No   Drug use: No   Sexual activity: Yes    Birth control/protection: Surgical  Other Topics Concern   Not on file  Social History Narrative   Not on file   Social Drivers of Health   Financial Resource Strain: Not on file  Food Insecurity: Unknown (06/22/2023)   Hunger Vital Sign    Worried About Running Out of Food in the Last Year: Never true    Ran Out of Food in the Last Year: Not on file  Transportation Needs: Unknown (06/22/2023)    PRAPARE - Administrator, Civil Service (Medical): No    Lack of Transportation (Non-Medical): Not on file  Physical Activity: Not on file  Stress: Not on file  Social Connections: Not on file  Intimate Partner Violence: Unknown (06/22/2023)   Humiliation, Afraid, Rape, and Kick questionnaire    Fear of Current or Ex-Partner: No    Emotionally Abused: Not on file    Physically Abused: Not on file    Sexually Abused: Not on file    Vital Signs: There were no vitals taken for this visit. There is no height or weight on file to calculate BMI.    Examination: General Appearance: The patient is well-developed, well-nourished, and in no distress. Neck Circumference: 40 cm Skin: Gross inspection of skin unremarkable. Head: normocephalic, no gross deformities. Eyes: no gross deformities noted. ENT: ears  appear grossly normal Neurologic: Alert and oriented. No involuntary movements.  STOP BANG RISK ASSESSMENT S (snore) Have you been told that you snore?     NO   T (tired) Are you often tired, fatigued, or sleepy during the day?   NO  O (obstruction) Do you stop breathing, choke, or gasp during sleep? NO   P (pressure) Do you have or are you being treated for high blood pressure? YES   B (BMI) Is your body index greater than 35 kg/m? YES   A (age) Are you 86 years old or older? NO   N (neck) Do you have a neck circumference greater than 16 inches?   NO   G (gender) Are you a female? NO   TOTAL STOP/BANG "YES" ANSWERS 2       A STOP-Bang score of 2 or less is considered low risk, and a score of 5 or more is high risk for having either moderate or severe OSA. For people who score 3 or 4, doctors may need to perform further assessment to determine how likely they are to have OSA.         EPWORTH SLEEPINESS SCALE:  Scale:  (0)= no chance of dozing; (1)= slight chance of dozing; (2)= moderate chance of dozing; (3)= high chance of dozing  Chance  Situtation     Sitting and reading: ***    Watching TV: ***    Sitting Inactive in public: ***    As a passenger in car: ***      Lying down to rest: ***    Sitting and talking: ***    Sitting quielty after lunch: ***    In a car, stopped in traffic: ***   TOTAL SCORE:   *** out of 24    SLEEP STUDIES:  Split - [employer] 06-15-23 - AHI 39.7/hr, Supine AHI 52.9/hr , min Sp02 84% CPAP @ 10cmH20    CPAP COMPLIANCE DATA:  Date Range: 01/19/2024-02/17/2024  Average Daily Use: 5 hours 38 minutes  Median Use: 5 hours 6 minutes  Compliance for > 4 Hours: 70%  AHI: 1.5 respiratory events per hour  Days Used: 26/30 days  Mask Leak: 22.2  95th Percentile Pressure: 10         LABS: No results found for this or any previous visit (from the past 2160 hours).  Radiology: MM 3D DIAGNOSTIC MAMMOGRAM BILATERAL BREAST Result Date: 12/13/2023 CLINICAL DATA:  Patient reports diffuse intermittent throbbing and stabbing pain in the left breast for 1 month. 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 Patient is due for annual exam. EXAM: DIGITAL DIAGNOSTIC BILATERAL MAMMOGRAM WITH TOMOSYNTHESIS AND CAD; ULTRASOUND LEFT BREAST LIMITED TECHNIQUE: Bilateral digital diagnostic mammography and breast tomosynthesis was performed. The images were evaluated with computer-aided detection. ; Targeted ultrasound examination of the left breast was performed. COMPARISON:  Previous exam(s). ACR Breast Density Category c: The breasts are heterogeneously dense, which may obscure small masses. FINDINGS: No suspicious mass, calcification, or other finding in either breast. At the time of my clinical exam, the patient endorses focal pain in the lower outer left breast. On physical exam, I do not appreciate a suspicious mass. Tenderness to palpation is noted. Targeted ultrasound of the lower outer left breast,  at the patient indicated area of intermittent pain, was performed. No suspicious solid or cystic mass. A 1.0 x 0.4 x 0.7 cm oval hypoechoic mass with  echogenic focus at the 5 o'clock position 4 cm from nipple corresponds to the biopsy proven fibroadenoma with biopsy marking clip. This is not significantly changed compared to 11/30/2017 when it measured 0.8 x 0.4 x. 0.5 cm. Several subcentimeter oval anechoic circumscribed cysts are noted. IMPRESSION: 1. No mammographic or sonographic abnormality at the patient indicated area of intermittent pain in the lower outer left breast. 2. No mammographic findings of malignancy either breast. RECOMMENDATION: Any further workup of the patient's symptoms should be based on the clinical assessment. I discussed with the patient that breast pain is a common condition and often resolves on its own without intervention. It can be exacerbated by hormonal changes, hormonal medications, caffeine , and weight changes. It can be improved by wearing adequate support, over-the-counter pain medication, low-fat diet, exercise, and ice as needed. Topical medications containing diclofenac or lidocaine  may provide temporary pain relief. Studies have shown an improvement in cyclic pain with use of evening primrose oil and vitamin E. Recommend routine annual screening mammogram in 1 year. The [employer] recommends annual MRI and mammography in patients with an estimated lifetime risk of developing breast cancer greater than 20%. If patient's lifetime risk is less than 20%, given breast density, supplemental screening with abbreviated breast MRI could be considered. I have discussed the findings and recommendations with the patient. If applicable, a reminder letter will be sent to the patient regarding the next appointment. BI-RADS CATEGORY  2: Benign. Electronically Signed   By: Mercie Stalker M.D.   On: 12/13/2023 16:07   US  LIMITED ULTRASOUND INCLUDING AXILLA LEFT BREAST   Result Date: 12/13/2023 CLINICAL DATA:  Patient reports diffuse intermittent throbbing and stabbing pain in the left breast for 1 month. 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 Patient is due for annual exam. EXAM: DIGITAL DIAGNOSTIC BILATERAL MAMMOGRAM WITH TOMOSYNTHESIS AND CAD; ULTRASOUND LEFT BREAST LIMITED TECHNIQUE: Bilateral digital diagnostic mammography and breast tomosynthesis was performed. The images were evaluated with computer-aided detection. ; Targeted ultrasound examination of the left breast was performed. COMPARISON:  Previous exam(s). ACR Breast Density Category c: The breasts are heterogeneously dense, which may obscure small masses. FINDINGS: No suspicious mass, calcification, or other finding in either breast. At the time of my clinical exam, the patient endorses focal pain in the lower outer left breast. On physical exam, I do not appreciate a suspicious mass. Tenderness to palpation is noted. Targeted ultrasound of the lower outer left breast, at the patient indicated area of intermittent pain, was performed. No suspicious solid or cystic mass. A 1.0 x 0.4 x 0.7 cm oval hypoechoic mass with echogenic focus at the 5 o'clock position 4 cm from nipple corresponds to the biopsy proven fibroadenoma with biopsy marking clip. This is not significantly changed compared to 11/30/2017 when it measured 0.8 x 0.4 x. 0.5 cm. Several subcentimeter oval anechoic circumscribed cysts are noted. IMPRESSION: 1. No mammographic or sonographic abnormality at the patient indicated area of intermittent pain in the lower outer left breast. 2. No mammographic findings of malignancy either breast. RECOMMENDATION: Any further workup of the patient's symptoms should be based on the clinical assessment. I discussed with the patient that breast pain is a common condition and often resolves on its own without  intervention. It can be exacerbated by hormonal changes, hormonal medications, caffeine , and weight changes. It can be improved by wearing adequate support, over-the-counter pain medication, low-fat diet,  exercise, and ice as needed. Topical medications containing diclofenac or lidocaine  may provide temporary pain relief. Studies have shown an improvement in cyclic pain with use of evening primrose oil and vitamin E. Recommend routine annual screening mammogram in 1 year. The [employer] recommends annual MRI and mammography in patients with an estimated lifetime risk of developing breast cancer greater than 20%. If patient's lifetime risk is less than 20%, given breast density, supplemental screening with abbreviated breast MRI could be considered. I have discussed the findings and recommendations with the patient. If applicable, a reminder letter will be sent to the patient regarding the next appointment. BI-RADS CATEGORY  2: Benign. Electronically Signed   By: Mercie Stalker M.D.   On: 12/13/2023 16:07    No results found.  No results found.    Assessment and Plan: Patient Active Problem List   Diagnosis Date Noted   Fibroid uterus 01/29/2024   Submucous leiomyoma of uterus 11/10/2023   Bulky or enlarged uterus 11/10/2023   Pelvic pressure in female 11/10/2023   Hemorrhagic ovarian cyst 11/10/2023   Menorrhagia with regular cycle 11/10/2023   OSA (obstructive sleep apnea) 10/18/2023   CPAP use counseling 10/18/2023   Obesity (BMI 30-39.9) 10/18/2023   Microcytic anemia 06/22/2023   Herpes zoster dermatitis with ophthalmic complication 06/11/2021   Shingles 06/11/2021   Herpes zoster ophthalmicus of left eye    LFT elevation    Hypothyroidism    Morbid obesity (HCC)    Asthma    COVID-19 virus infection 08/2020   Nipple discharge 12/03/2015   Abnormal mammogram of left breast 12/03/2015      The patient *** tolerate PAP and reports *** benefit from PAP use. The  patient was reminded how to *** and advised to ***. The patient was also counselled on ***. The compliance is ***. The AHI is ***.   ***  General Counseling: I have discussed the findings of the evaluation and examination with [facility].  I have also discussed any further diagnostic evaluation thatmay be needed or ordered today. Zarie verbalizes understanding of the findings of todays visit. We also reviewed her medications today and discussed drug interactions and side effects including but not limited excessive drowsiness and altered mental states. We also discussed that there is always a risk not just to her but also people around her. she has been encouraged to call the office with any questions or concerns that should arise related to todays visit.  No orders of the defined types were placed in this encounter.       I have personally obtained a history, examined the patient, evaluated laboratory and imaging results, formulated the assessment and plan and placed orders.  Cordie Deters, MD [facility] Diplomate ABMS Pulmonary Critical Care Medicine and Sleep Medicine

## 2024-02-21 ENCOUNTER — Ambulatory Visit (INDEPENDENT_AMBULATORY_CARE_PROVIDER_SITE_OTHER): Admitting: Internal Medicine

## 2024-02-21 VITALS — BP 119/84 | HR 80 | Resp 16 | Ht 65.0 in | Wt 233.0 lb

## 2024-02-21 DIAGNOSIS — Z7189 Other specified counseling: Secondary | ICD-10-CM | POA: Diagnosis not present

## 2024-02-21 DIAGNOSIS — G4733 Obstructive sleep apnea (adult) (pediatric): Secondary | ICD-10-CM

## 2024-02-21 DIAGNOSIS — E669 Obesity, unspecified: Secondary | ICD-10-CM | POA: Diagnosis not present

## 2024-02-21 NOTE — Patient Instructions (Signed)

## 2024-02-21 NOTE — Telephone Encounter (Signed)
 Pt calling our office to let us  know her medicaid expires 03/29/24 NOT 02/28/24. She had received a call that she needed to pay a copay? Can you look into this? She also states she is okay with new appt date and time.

## 2024-02-23 ENCOUNTER — Encounter: Payer: Self-pay | Admitting: Oncology

## 2024-02-29 ENCOUNTER — Inpatient Hospital Stay (HOSPITAL_BASED_OUTPATIENT_CLINIC_OR_DEPARTMENT_OTHER): Payer: Medicaid Other | Admitting: Oncology

## 2024-02-29 ENCOUNTER — Ambulatory Visit: Payer: Self-pay | Admitting: Oncology

## 2024-02-29 ENCOUNTER — Ambulatory Visit: Payer: Self-pay | Admitting: Obstetrics and Gynecology

## 2024-02-29 ENCOUNTER — Inpatient Hospital Stay: Payer: Medicaid Other | Attending: Oncology

## 2024-02-29 ENCOUNTER — Encounter: Payer: Self-pay | Admitting: Oncology

## 2024-02-29 VITALS — BP 115/78 | HR 75 | Temp 98.0°F | Resp 19 | Ht 65.0 in | Wt 232.0 lb

## 2024-02-29 DIAGNOSIS — E039 Hypothyroidism, unspecified: Secondary | ICD-10-CM | POA: Diagnosis not present

## 2024-02-29 DIAGNOSIS — K921 Melena: Secondary | ICD-10-CM | POA: Insufficient documentation

## 2024-02-29 DIAGNOSIS — Z8616 Personal history of COVID-19: Secondary | ICD-10-CM | POA: Insufficient documentation

## 2024-02-29 DIAGNOSIS — J45909 Unspecified asthma, uncomplicated: Secondary | ICD-10-CM | POA: Diagnosis not present

## 2024-02-29 DIAGNOSIS — Z79899 Other long term (current) drug therapy: Secondary | ICD-10-CM | POA: Diagnosis not present

## 2024-02-29 DIAGNOSIS — Z803 Family history of malignant neoplasm of breast: Secondary | ICD-10-CM | POA: Diagnosis not present

## 2024-02-29 DIAGNOSIS — D509 Iron deficiency anemia, unspecified: Secondary | ICD-10-CM | POA: Diagnosis present

## 2024-02-29 DIAGNOSIS — Z79890 Hormone replacement therapy (postmenopausal): Secondary | ICD-10-CM | POA: Diagnosis not present

## 2024-02-29 DIAGNOSIS — Z8041 Family history of malignant neoplasm of ovary: Secondary | ICD-10-CM | POA: Insufficient documentation

## 2024-02-29 DIAGNOSIS — Z800 Family history of malignant neoplasm of digestive organs: Secondary | ICD-10-CM | POA: Insufficient documentation

## 2024-02-29 LAB — CBC WITH DIFFERENTIAL (CANCER CENTER ONLY)
Abs Immature Granulocytes: 0.01 10*3/uL (ref 0.00–0.07)
Basophils Absolute: 0 10*3/uL (ref 0.0–0.1)
Basophils Relative: 1 %
Eosinophils Absolute: 0.1 10*3/uL (ref 0.0–0.5)
Eosinophils Relative: 3 %
HCT: 35.4 % — ABNORMAL LOW (ref 36.0–46.0)
Hemoglobin: 11.5 g/dL — ABNORMAL LOW (ref 12.0–15.0)
Immature Granulocytes: 0 %
Lymphocytes Relative: 50 %
Lymphs Abs: 2.1 10*3/uL (ref 0.7–4.0)
MCH: 28.3 pg (ref 26.0–34.0)
MCHC: 32.5 g/dL (ref 30.0–36.0)
MCV: 87.2 fL (ref 80.0–100.0)
Monocytes Absolute: 0.4 10*3/uL (ref 0.1–1.0)
Monocytes Relative: 8 %
Neutro Abs: 1.6 10*3/uL — ABNORMAL LOW (ref 1.7–7.7)
Neutrophils Relative %: 38 %
Platelet Count: 203 10*3/uL (ref 150–400)
RBC: 4.06 MIL/uL (ref 3.87–5.11)
RDW: 14 % (ref 11.5–15.5)
WBC Count: 4.2 10*3/uL (ref 4.0–10.5)
nRBC: 0 % (ref 0.0–0.2)

## 2024-02-29 LAB — IRON AND TIBC
Iron: 77 ug/dL (ref 28–170)
Saturation Ratios: 18 % (ref 10.4–31.8)
TIBC: 419 ug/dL (ref 250–450)
UIBC: 342 ug/dL

## 2024-02-29 LAB — FERRITIN: Ferritin: 13 ng/mL (ref 11–307)

## 2024-02-29 NOTE — Progress Notes (Signed)
 Hematology/Oncology Consult note [facility]  [phone] [phone]  Patient Care Team: Center, [facility] as PCP - General (General Practice) Cindie Jesusa HERO, RN as Registered Nurse Dannielle Arlean FALCON, RN (Inactive) as Registered Nurse Dessa Reyes ORN, MD (General Surgery)   Name of the patient: Megan [name]  [id number]  07-08-1971   Date of visit: 02/29/24  Diagnosis-iron  deficiency anemia  Chief complaint/ Reason for visit-routine follow-up of iron  deficiency anemia  Heme/Onc history:  patient is a 53 year old female with a past medical history significantFor thyroidism, obesity, HIV among other medical problems.  She has been referred for iron  deficiency anemia.  CBC from 06/15/2023 showed H&H of 7.9/27.1 with an MCV of 74 with a normal white count and platelet count.  TSH was normal.  Patient is perimenopausal and does get her menstrual cycles which sometimes last over a week.  Denies any consistent use of NSAIDs.  Denies any family history of colon cancer.  She has never had a colonoscopy.  Denies any blood loss in her stool or urine.  Patient received IV iron  in November 2024.    Patient underwent EGD and colonoscopy by Dr. Jinny in January 2025.  EGD was unremarkable.  Colonoscopy showed nonbleeding hemorrhoids.  Patient does have heavy menstrual cycles from her fibroids and follows up with GYN as well.  Interval history-patient reports heavy menstrual cycles especially for the first couple of days.  She has known history of fibroids and is following up with GYN for consideration of surgery in the future.  She does report ongoing fatigue.  Occasional bright red blood in her stools  ECOG PS- 0 Pain scale- 0   Review of systems- Review of Systems  Constitutional:  Positive for malaise/fatigue. Negative for chills, fever and weight loss.  HENT:  Negative for congestion, ear discharge and nosebleeds.   Eyes:   Negative for blurred vision.  Respiratory:  Negative for cough, hemoptysis, sputum production, shortness of breath and wheezing.   Cardiovascular:  Negative for chest pain, palpitations, orthopnea and claudication.  Gastrointestinal:  Positive for blood in stool. Negative for abdominal pain, constipation, diarrhea, heartburn, melena, nausea and vomiting.  Genitourinary:  Negative for dysuria, flank pain, frequency, hematuria and urgency.       Menorrhagia  Musculoskeletal:  Negative for back pain, joint pain and myalgias.  Skin:  Negative for rash.  Neurological:  Negative for dizziness, tingling, focal weakness, seizures, weakness and headaches.  Endo/Heme/Allergies:  Does not bruise/bleed easily.  Psychiatric/Behavioral:  Negative for depression and suicidal ideas. The patient does not have insomnia.       No Known Allergies   Past Medical History:  Diagnosis Date   Anxiety    Asthma    COVID-19 virus infection 08/2020   Hypothyroidism    Morbid obesity (HCC)      Past Surgical History:  Procedure Laterality Date   BREAST BIOPSY Right 11/11/2020   us  bx, vision marker, - FRAGMENTS OF BENIGN INTRAMAMMARY LYMPH NODE.  NEGATIVE FOR ATYPICAL PROLIFERATIVE BREAST DISEASE.   BREAST BIOPSY Left 11/11/2020`   us  bx, Q shape, FIBROEPITHELIAL PROLIFERATION WITH SCLEROSIS. NEGATIVE FOR ATYPICAL PROLIFERATIVE BREAST DISEASE IN THIS SAMPLE.    BREAST BIOPSY Left 12/03/2020   us  bx, vision clip, fiboadenoma   BREAST BIOPSY Left 12/03/2020   us  bx, x-clip, fibroadenoma   BREAST BIOPSY Left 02/03/2021   Procedure: BREAST BIOPSY WITH NEEDLE LOCALIZATION;  Surgeon: Dessa Reyes ORN, MD;  Location: [facility] ORS;  Service: General;  Laterality: Left;   BREAST BIOPSY Right 01/21/2022   Us  core bx right breast ribbon clip path pending   BREAST BIOPSY Right 04/06/2022   Procedure: BREAST BIOPSY;  Surgeon: Dessa Reyes ORN, MD;  Location: [facility] ORS;  Service: General;  Laterality: Right;   BREAST  LUMPECTOMY Left 02/03/2021   LT 2:00 X clip NL done prior to lumpectomy    CESAREAN SECTION     COLONOSCOPY WITH PROPOFOL  N/A 09/22/2023   Procedure: COLONOSCOPY WITH PROPOFOL ;  Surgeon: Jinny Carmine, MD;  Location: [facility] ENDOSCOPY;  Service: Endoscopy;  Laterality: N/A;   ESOPHAGOGASTRODUODENOSCOPY (EGD) WITH PROPOFOL  N/A 09/22/2023   Procedure: ESOPHAGOGASTRODUODENOSCOPY (EGD) WITH PROPOFOL ;  Surgeon: Jinny Carmine, MD;  Location: [facility] ENDOSCOPY;  Service: Endoscopy;  Laterality: N/A;   FOOT SURGERY     bone spur   TUBAL LIGATION      Social History   Socioeconomic History   Marital status: Single    Spouse name: Not on file   Number of children: Not on file   Years of education: Not on file   Highest education level: Not on file  Occupational History   Not on file  Tobacco Use   Smoking status: Never   Smokeless tobacco: Never  Vaping Use   Vaping status: Never Used  Substance and Sexual Activity   Alcohol use: No   Drug use: No   Sexual activity: Yes    Birth control/protection: Surgical  Other Topics Concern   Not on file  Social History Narrative   Not on file   Social Drivers of Health   Financial Resource Strain: Not on file  Food Insecurity: Unknown (06/22/2023)   Hunger Vital Sign    Worried About Running Out of Food in the Last Year: Never true    Ran Out of Food in the Last Year: Not on file  Transportation Needs: Unknown (06/22/2023)   PRAPARE - Administrator, Civil Service (Medical): No    Lack of Transportation (Non-Medical): Not on file  Physical Activity: Not on file  Stress: Not on file  Social Connections: Not on file  Intimate Partner Violence: Unknown (06/22/2023)   Humiliation, Afraid, Rape, and Kick questionnaire    Fear of Current or Ex-Partner: No    Emotionally Abused: Not on file    Physically Abused: Not on file    Sexually Abused: Not on file    Family History  Problem Relation Age of Onset   Thyroid disease Mother     Hypertension Mother    Diabetes Mother    Rheum arthritis Mother    Breast cancer Maternal Grandmother        great grandmother   Cancer Paternal Grandmother        Pancreatic   Ovarian cancer Other 100     Current Outpatient Medications:    ascorbic acid  (VITAMIN C) 500 MG tablet, Take 1 tablet (500 mg total) by mouth daily. (Patient not taking: Reported on 12/30/2023), Disp: 30 tablet, Rfl: 0   BIKTARVY 50-200-25 MG TABS tablet, Take 1 tablet by mouth daily., Disp: , Rfl:    capsaicin (ZOSTRIX) 0.025 % cream, , Disp: , Rfl:    Cholecalciferol 50 MCG (2000 UT) CAPS, , Disp: , Rfl:    dexamethasone  (DECADRON ) 0.1 % ophthalmic suspension, Place 2 drops into both eyes every 4 (four) hours., Disp: 15 mL, Rfl: 0   diclofenac (VOLTAREN) 75 MG EC tablet, Take 1 tablet twice a day by oral route  with meal(s)., Disp: , Rfl:    docusate (COLACE) 50 MG/5ML liquid, Take 10 mLs (100 mg total) by mouth 2 (two) times daily as needed for mild constipation., Disp: 100 mL, Rfl: 0   feeding supplement (ENSURE ENLIVE / ENSURE PLUS) LIQD, Take 237 mLs by mouth 3 (three) times daily between meals. (Patient not taking: Reported on 12/30/2023), Disp: 237 mL, Rfl: 12   Ferrous Sulfate (IRON ) 325 (65 Fe) MG TABS, Take 1 tablet by mouth daily., Disp: , Rfl:    HYDROcodone -acetaminophen  (NORCO/VICODIN) 5-325 MG tablet, Take 2 tablets by mouth every 6 (six) hours as needed for moderate pain (pain score 4-6) or severe pain (pain score 7-10)., Disp: 16 tablet, Rfl: 0   hydrocortisone 2.5 % cream, Apply 1 Application topically once as needed., Disp: , Rfl:    iron  polysaccharides (NIFEREX) 150 MG capsule, Take 1 capsule (150 mg total) by mouth daily., Disp: 30 capsule, Rfl: 0   levothyroxine  (SYNTHROID ) 88 MCG tablet, Take 88 mcg by mouth daily before breakfast., Disp: , Rfl:    loratadine  (CLARITIN ) 10 MG tablet, Take 10 mg by mouth daily as needed for allergies., Disp: , Rfl:    PARoxetine  (PAXIL ) 40 MG tablet, Take 40 mg by  mouth daily., Disp: , Rfl:    pregabalin (LYRICA) 100 MG capsule, Take 100 mg by mouth 3 (three) times daily., Disp: , Rfl:    traZODone  (DESYREL ) 50 MG tablet, Take 50 mg by mouth at bedtime as needed for sleep., Disp: , Rfl:    valACYclovir  (VALTREX ) 1000 MG tablet, Take 1,000 mg by mouth 3 (three) times daily., Disp: , Rfl:    VENTOLIN  HFA 108 (90 Base) MCG/ACT inhaler, Inhale into the lungs., Disp: , Rfl:    vitamin B-12 (CYANOCOBALAMIN ) 500 MCG tablet, Take 1 tablet (500 mcg total) by mouth daily., Disp: 30 tablet, Rfl: 0   WEGOVY 2.4 MG/0.75ML SOAJ, , Disp: , Rfl:   Physical exam:  Vitals:   02/29/24 1538  BP: 115/78  Pulse: 75  Resp: 19  Temp: 98 F (36.7 C)  TempSrc: Tympanic  SpO2: 98%  Weight: 232 lb (105.2 kg)  Height: 5' 5 (1.651 m)   Physical Exam  Cardiovascular:     Rate and Rhythm: Normal rate and regular rhythm.     Heart sounds: Normal heart sounds.  Pulmonary:     Effort: Pulmonary effort is normal.  Abdominal:     General: Bowel sounds are normal.     Palpations: Abdomen is soft.   Skin:    General: Skin is warm and dry.   Neurological:     Mental Status: She is alert and oriented to person, place, and time.      I have personally reviewed labs listed below:    Latest Ref Rng & Units 11/01/2023    3:22 PM  CMP  Glucose 70 - 99 mg/dL 889   BUN 6 - 20 mg/dL 9   Creatinine 9.55 - 8.99 mg/dL 8.91   Sodium 864 - 854 mmol/L 138   Potassium 3.5 - 5.1 mmol/L 3.7   Chloride 98 - 111 mmol/L 107   CO2 22 - 32 mmol/L 25   Calcium 8.9 - 10.3 mg/dL 9.3   Total Protein 6.5 - 8.1 g/dL 7.6   Total Bilirubin 0.0 - 1.2 mg/dL 1.4   Alkaline Phos 38 - 126 U/L 46   AST 15 - 41 U/L 23   ALT 0 - 44 U/L 22  Latest Ref Rng & Units 02/29/2024    3:17 PM  CBC  WBC 4.0 - 10.5 K/uL 4.2   Hemoglobin 12.0 - 15.0 g/dL 88.4   Hematocrit 63.9 - 46.0 % 35.4   Platelets 150 - 400 K/uL 203     Assessment and plan- Patient is a 53 y.o. female here for routine f/u  of iron  deficiency anemia  Patients anemia has improved overall As compared to October 2024 when her hemoglobin was as low as 7.8.  It is 11.5 presently.  Ferritin levels are still low at 13 and therefore we will proceed with 1 more dose of Monoferric  at this time.  CBC ferritin and iron  studies in 4 and 8 months and I will see her back in 8 months.  Discussed this and benefits of IV iron  including all but not limited to possible risk of infusion anaphylactic reaction.  Patient understands and agrees to proceed as planned.  She also understands the importance of following up with GYN to manage her fibroids which is most likely the cause of her anemia given that her GI workup was otherwise unremarkable.  She does report some occasional bright red blood in her stools which could be secondary to hemorrhoidal bleeding   Visit Diagnosis 1. Iron  deficiency anemia, unspecified iron  deficiency anemia type      Dr. Annah Skene, MD, MPH [facility] at [facility] [phone] 02/29/2024 4:39 PM

## 2024-03-08 ENCOUNTER — Ambulatory Visit: Payer: Self-pay | Admitting: Obstetrics and Gynecology

## 2024-03-08 ENCOUNTER — Encounter: Payer: Self-pay | Admitting: Obstetrics and Gynecology

## 2024-03-08 VITALS — BP 112/77 | HR 73 | Ht 65.0 in | Wt 230.6 lb

## 2024-03-08 DIAGNOSIS — R102 Pelvic and perineal pain: Secondary | ICD-10-CM

## 2024-03-08 DIAGNOSIS — N920 Excessive and frequent menstruation with regular cycle: Secondary | ICD-10-CM

## 2024-03-08 DIAGNOSIS — N946 Dysmenorrhea, unspecified: Secondary | ICD-10-CM

## 2024-03-08 DIAGNOSIS — D250 Submucous leiomyoma of uterus: Secondary | ICD-10-CM | POA: Diagnosis not present

## 2024-03-08 DIAGNOSIS — N951 Menopausal and female climacteric states: Secondary | ICD-10-CM

## 2024-03-08 NOTE — Progress Notes (Signed)
 HPI:      Ms. Megan [name] is a 53 y.o. [id number] who LMP was Patient's last menstrual period was 02/15/2024.  Subjective:   She presents today to discuss definitive management of her uterine fibroids.  She states that she gets constant bloating and cramping from them and has heavy painful menstrual bleeding.  She has no signs of menopause at this time denying hot flashes etc. She has been to see vascular for possible UFE and did not find this possibility enticing.  She does not feel as if hormonal manipulation of her cycle will work and one of her fibroids appears to be submucosal ruling out IUD. She has decided that she would like hysterectomy for this problem.    Hx: The following portions of the patient's history were reviewed and updated as appropriate:             She  has a past medical history of Anxiety, Asthma, COVID-19 virus infection (08/2020), Hypothyroidism, and Morbid obesity (HCC). She does not have any pertinent problems on file. She  has a past surgical history that includes Cesarean section; Tubal ligation; Foot surgery; Breast biopsy (Right, 11/11/2020); Breast biopsy (Left, 11/11/2020`); Breast biopsy (Left, 12/03/2020); Breast biopsy (Left, 12/03/2020); Breast lumpectomy (Left, 02/03/2021); Breast biopsy (Left, 02/03/2021); Breast biopsy (Right, 01/21/2022); Breast biopsy (Right, 04/06/2022); Colonoscopy with propofol  (N/A, 09/22/2023); and Esophagogastroduodenoscopy (egd) with propofol  (N/A, 09/22/2023). Her family history includes Breast cancer in her maternal grandmother; Cancer in her paternal grandmother; Diabetes in her mother; Hypertension in her mother; Ovarian cancer (age of onset: 45) in an other family member; Rheum arthritis in her mother; Thyroid disease in her mother. She  reports that she has never smoked. She has never used smokeless tobacco. She reports that she does not drink alcohol and does not use drugs. She has a current medication list which includes the  following prescription(s): ascorbic acid , biktarvy, capsaicin, cholecalciferol, dexamethasone , diclofenac, docusate, feeding supplement, iron , hydrocodone -acetaminophen , hydrocortisone, iron  polysaccharides, levothyroxine , loratadine , paroxetine , pregabalin, trazodone , valacyclovir , ventolin  hfa, cyanocobalamin , and wegovy. She has no known allergies.       Review of Systems:  Review of Systems  Constitutional: Denied constitutional symptoms, night sweats, recent illness, fatigue, fever, insomnia and weight loss.  Eyes: Denied eye symptoms, eye pain, photophobia, vision change and visual disturbance.  Ears/Nose/Throat/Neck: Denied ear, nose, throat or neck symptoms, hearing loss, nasal discharge, sinus congestion and sore throat.  Cardiovascular: Denied cardiovascular symptoms, arrhythmia, chest pain/pressure, edema, exercise intolerance, orthopnea and palpitations.  Respiratory: Denied pulmonary symptoms, asthma, pleuritic pain, productive sputum, cough, dyspnea and wheezing.  Gastrointestinal: Denied, gastro-esophageal reflux, melena, nausea and vomiting.  Genitourinary: See HPI for additional information.  Musculoskeletal: Denied musculoskeletal symptoms, stiffness, swelling, muscle weakness and myalgia.  Dermatologic: Denied dermatology symptoms, rash and scar.  Neurologic: Denied neurology symptoms, dizziness, headache, neck pain and syncope.  Psychiatric: Denied psychiatric symptoms, anxiety and depression.  Endocrine: Denied endocrine symptoms including hot flashes and night sweats.   Meds:   Current Outpatient Medications on File Prior to Visit  Medication Sig Dispense Refill   ascorbic acid  (VITAMIN C) 500 MG tablet Take 1 tablet (500 mg total) by mouth daily. (Patient not taking: Reported on 12/30/2023) 30 tablet 0   BIKTARVY 50-200-25 MG TABS tablet Take 1 tablet by mouth daily.     capsaicin (ZOSTRIX) 0.025 % cream      Cholecalciferol 50 MCG (2000 UT) CAPS      dexamethasone   (DECADRON ) 0.1 % ophthalmic suspension Place 2 drops into  both eyes every 4 (four) hours. 15 mL 0   diclofenac (VOLTAREN) 75 MG EC tablet Take 1 tablet twice a day by oral route with meal(s).     docusate (COLACE) 50 MG/5ML liquid Take 10 mLs (100 mg total) by mouth 2 (two) times daily as needed for mild constipation. 100 mL 0   feeding supplement (ENSURE ENLIVE / ENSURE PLUS) LIQD Take 237 mLs by mouth 3 (three) times daily between meals. (Patient not taking: Reported on 12/30/2023) 237 mL 12   Ferrous Sulfate (IRON ) 325 (65 Fe) MG TABS Take 1 tablet by mouth daily.     HYDROcodone -acetaminophen  (NORCO/VICODIN) 5-325 MG tablet Take 2 tablets by mouth every 6 (six) hours as needed for moderate pain (pain score 4-6) or severe pain (pain score 7-10). 16 tablet 0   hydrocortisone 2.5 % cream Apply 1 Application topically once as needed.     iron  polysaccharides (NIFEREX) 150 MG capsule Take 1 capsule (150 mg total) by mouth daily. 30 capsule 0   levothyroxine  (SYNTHROID ) 88 MCG tablet Take 88 mcg by mouth daily before breakfast.     loratadine  (CLARITIN ) 10 MG tablet Take 10 mg by mouth daily as needed for allergies.     PARoxetine  (PAXIL ) 40 MG tablet Take 40 mg by mouth daily.     pregabalin (LYRICA) 100 MG capsule Take 100 mg by mouth 3 (three) times daily.     traZODone  (DESYREL ) 50 MG tablet Take 50 mg by mouth at bedtime as needed for sleep.     valACYclovir  (VALTREX ) 1000 MG tablet Take 1,000 mg by mouth 3 (three) times daily.     VENTOLIN  HFA 108 (90 Base) MCG/ACT inhaler Inhale into the lungs.     vitamin B-12 (CYANOCOBALAMIN ) 500 MCG tablet Take 1 tablet (500 mcg total) by mouth daily. 30 tablet 0   WEGOVY 2.4 MG/0.75ML SOAJ      No current facility-administered medications on file prior to visit.      Objective:     Vitals:   03/08/24 1451  BP: 112/77  Pulse: 73   Filed Weights   03/08/24 1451  Weight: 230 lb 9.6 oz (104.6 kg)              CT results reviewed/ultrasound  results reviewed uterus enlarged 15 x 10 x 12 cm        Abdominal examination-uterus not definitively palpated.  Assessment:    [id number] Patient Active Problem List   Diagnosis Date Noted   Fibroid uterus 01/29/2024   Submucous leiomyoma of uterus 11/10/2023   Bulky or enlarged uterus 11/10/2023   Pelvic pressure in female 11/10/2023   Hemorrhagic ovarian cyst 11/10/2023   Menorrhagia with regular cycle 11/10/2023   OSA (obstructive sleep apnea) 10/18/2023   CPAP use counseling 10/18/2023   Obesity (BMI 30-39.9) 10/18/2023   Microcytic anemia 06/22/2023   Herpes zoster dermatitis with ophthalmic complication 06/11/2021   Shingles 06/11/2021   Herpes zoster ophthalmicus of left eye    LFT elevation    Hypothyroidism    Morbid obesity (HCC)    Asthma    COVID-19 virus infection 08/2020   Nipple discharge 12/03/2015   Abnormal mammogram of left breast 12/03/2015     1. Submucous leiomyoma of uterus   2. Menorrhagia with regular cycle   3. Dysmenorrhea   4. Pelvic pain     Patient with large symptomatic uterine fibroids.  She desires definitive management.   Plan:  1.  We have discussed multiple options including hormonal manipulation of cycles, UFE at a different location other than [place], and possible hysterectomy.  Because of the large size of the uterus and her lack of vaginal births we have discussed the possibly abdominal hysterectomy versus laparoscopic hysterectomy.  Risks and benefits of each reviewed.  We have also discussed the possibly of oophorectomy as the patient is currently 53 years old.  Subsequent HRT also discussed.  She would like to schedule a preop appointment for hysterectomy.  We will decide at that time abdominal versus laparoscopic as well as oophorectomy.  Orders No orders of the defined types were placed in this encounter.   No orders of the defined types were placed in this encounter.     F/U  Return in about 4 weeks (around  04/05/2024).  Alm DOROTHA Sar, M.D. 03/08/2024 3:38 PM

## 2024-03-09 ENCOUNTER — Inpatient Hospital Stay

## 2024-03-09 ENCOUNTER — Ambulatory Visit

## 2024-03-13 ENCOUNTER — Inpatient Hospital Stay

## 2024-03-13 VITALS — BP 120/77 | HR 73 | Temp 97.9°F | Resp 16

## 2024-03-13 DIAGNOSIS — D509 Iron deficiency anemia, unspecified: Secondary | ICD-10-CM

## 2024-03-13 MED ORDER — SODIUM CHLORIDE 0.9 % IV SOLN
1000.0000 mg | Freq: Once | INTRAVENOUS | Status: AC
  Administered 2024-03-13: 1000 mg via INTRAVENOUS
  Filled 2024-03-13: qty 1000

## 2024-03-13 MED ORDER — SODIUM CHLORIDE 0.9 % IV SOLN
Freq: Once | INTRAVENOUS | Status: AC
  Filled 2024-03-13: qty 250

## 2024-03-16 ENCOUNTER — Inpatient Hospital Stay

## 2024-03-20 ENCOUNTER — Emergency Department

## 2024-03-20 ENCOUNTER — Other Ambulatory Visit: Payer: Self-pay

## 2024-03-20 DIAGNOSIS — N939 Abnormal uterine and vaginal bleeding, unspecified: Secondary | ICD-10-CM | POA: Insufficient documentation

## 2024-03-20 DIAGNOSIS — R1030 Lower abdominal pain, unspecified: Secondary | ICD-10-CM | POA: Diagnosis present

## 2024-03-20 DIAGNOSIS — D259 Leiomyoma of uterus, unspecified: Secondary | ICD-10-CM | POA: Diagnosis not present

## 2024-03-20 LAB — CBC WITH DIFFERENTIAL/PLATELET
Abs Immature Granulocytes: 0.01 K/uL (ref 0.00–0.07)
Basophils Absolute: 0 K/uL (ref 0.0–0.1)
Basophils Relative: 1 %
Eosinophils Absolute: 0.2 K/uL (ref 0.0–0.5)
Eosinophils Relative: 3 %
HCT: 36 % (ref 36.0–46.0)
Hemoglobin: 11.8 g/dL — ABNORMAL LOW (ref 12.0–15.0)
Immature Granulocytes: 0 %
Lymphocytes Relative: 37 %
Lymphs Abs: 1.9 K/uL (ref 0.7–4.0)
MCH: 28.6 pg (ref 26.0–34.0)
MCHC: 32.8 g/dL (ref 30.0–36.0)
MCV: 87.4 fL (ref 80.0–100.0)
Monocytes Absolute: 0.5 K/uL (ref 0.1–1.0)
Monocytes Relative: 9 %
Neutro Abs: 2.5 K/uL (ref 1.7–7.7)
Neutrophils Relative %: 50 %
Platelets: 201 K/uL (ref 150–400)
RBC: 4.12 MIL/uL (ref 3.87–5.11)
RDW: 14.4 % (ref 11.5–15.5)
Smear Review: NORMAL
WBC: 5 K/uL (ref 4.0–10.5)
nRBC: 0 % (ref 0.0–0.2)

## 2024-03-20 LAB — COMPREHENSIVE METABOLIC PANEL WITH GFR
ALT: 18 U/L (ref 0–44)
AST: 22 U/L (ref 15–41)
Albumin: 3.7 g/dL (ref 3.5–5.0)
Alkaline Phosphatase: 51 U/L (ref 38–126)
Anion gap: 10 (ref 5–15)
BUN: 11 mg/dL (ref 6–20)
CO2: 24 mmol/L (ref 22–32)
Calcium: 9.1 mg/dL (ref 8.9–10.3)
Chloride: 107 mmol/L (ref 98–111)
Creatinine, Ser: 1.22 mg/dL — ABNORMAL HIGH (ref 0.44–1.00)
GFR, Estimated: 54 mL/min — ABNORMAL LOW (ref >60)
Glucose, Bld: 98 mg/dL (ref 70–99)
Potassium: 3.6 mmol/L (ref 3.5–5.1)
Sodium: 141 mmol/L (ref 135–145)
Total Bilirubin: 0.9 mg/dL (ref 0.0–1.2)
Total Protein: 7.1 g/dL (ref 6.5–8.1)

## 2024-03-20 LAB — TYPE AND SCREEN
ABO/RH(D): A POS
Antibody Screen: NEGATIVE

## 2024-03-20 LAB — HCG, QUANTITATIVE, PREGNANCY: hCG, Beta Chain, Quant, S: <1 m[IU]/mL (ref <5)

## 2024-03-20 NOTE — ED Triage Notes (Signed)
 Pt is on her period and states periods have been getting heavier and heavier. States last night noticed quarter size clots. Pt has been using period underwear, changing hourly. Pt is ambulatory with steady gait. Period started 2 days ago and pt is having pelvic pain and lower back pain. States the pain made her start having shingles pain on her L face. Pt gets iron  infusions.   Skin is dry. PA at bedside in triage.

## 2024-03-20 NOTE — ED Provider Triage Note (Signed)
 Emergency Medicine Provider Triage Evaluation Note  Megan [name] , a 53 y.o. female  was evaluated in triage.  Pt complains of heavy vaginal bleeding with associated pelvic pain with radiation to her back. States currently on menstrual cycle.  Has had menstrual cycle 2 times this month.  History of anemia and needing iron  infusions.  Also notes having possible shingles outbreak to the left side of her face.  Patient has a history of shingles and this is similar.  There is a rash noted to her left eyebrow.  No eye involvement but does endorse pain to the area.  Patient has only had 1 series of shingles vaccine.  Review of Systems  Positive:  Negative:   Physical Exam  BP 122/82 (BP Location: Right Arm)   Pulse 81   Temp 97.8 F (36.6 C) (Oral)   Resp 20   Ht 5' 5 (1.651 m)   Wt 104.3 kg   LMP 03/18/2024 (Exact Date)   SpO2 100%   BMI 38.27 kg/m  Gen:   Awake, no distress   Resp:  Normal effort  MSK:   Moves extremities without difficulty  Other:    Medical Decision Making  Medically screening exam initiated at 4:52 PM.  Appropriate orders placed.  Megan [name] was informed that the remainder of the evaluation will be completed by another provider, this initial triage assessment does not replace that evaluation, and the importance of remaining in the ED until their evaluation is complete.     Margrette, Mouhamed Glassco A, PA-C 03/20/24 1656

## 2024-03-21 ENCOUNTER — Emergency Department

## 2024-03-21 ENCOUNTER — Emergency Department
Admission: EM | Admit: 2024-03-21 | Discharge: 2024-03-21 | Disposition: A | Discharge status: Home / Self Care | Attending: Emergency Medicine | Admitting: Emergency Medicine

## 2024-03-21 ENCOUNTER — Telehealth: Payer: Self-pay

## 2024-03-21 DIAGNOSIS — D250 Submucous leiomyoma of uterus: Secondary | ICD-10-CM

## 2024-03-21 DIAGNOSIS — R1030 Lower abdominal pain, unspecified: Secondary | ICD-10-CM

## 2024-03-21 DIAGNOSIS — N946 Dysmenorrhea, unspecified: Secondary | ICD-10-CM

## 2024-03-21 DIAGNOSIS — D259 Leiomyoma of uterus, unspecified: Secondary | ICD-10-CM

## 2024-03-21 DIAGNOSIS — N939 Abnormal uterine and vaginal bleeding, unspecified: Secondary | ICD-10-CM

## 2024-03-21 DIAGNOSIS — N920 Excessive and frequent menstruation with regular cycle: Secondary | ICD-10-CM

## 2024-03-21 DIAGNOSIS — R102 Pelvic and perineal pain: Secondary | ICD-10-CM

## 2024-03-21 HISTORY — DX: Anemia, unspecified: D64.9

## 2024-03-21 HISTORY — DX: Disorder of kidney and ureter, unspecified: N28.9

## 2024-03-21 LAB — HEMOGLOBIN AND HEMATOCRIT, BLOOD
HCT: 37.5 % (ref 36.0–46.0)
Hemoglobin: 12.2 g/dL (ref 12.0–15.0)

## 2024-03-21 MED ORDER — DROPERIDOL 2.5 MG/ML IJ SOLN
1.2500 mg | Freq: Once | INTRAMUSCULAR | Status: AC
  Administered 2024-03-21: 1.25 mg via INTRAVENOUS

## 2024-03-21 MED ORDER — ACETAMINOPHEN 500 MG PO TABS
1000.0000 mg | ORAL_TABLET | Freq: Once | ORAL | Status: AC
  Administered 2024-03-21: 1000 mg via ORAL
  Filled 2024-03-21: qty 2

## 2024-03-21 MED ORDER — OXYCODONE-ACETAMINOPHEN 5-325 MG PO TABS: 1.0000 | ORAL_TABLET | Freq: Four times a day (QID) | ORAL | 0 refills | Status: AC | PRN

## 2024-03-21 MED ORDER — OXYCODONE-ACETAMINOPHEN 5-325 MG PO TABS
1.0000 | ORAL_TABLET | Freq: Once | ORAL | Status: AC
  Administered 2024-03-21: 1 via ORAL
  Filled 2024-03-21: qty 1

## 2024-03-21 MED ORDER — PREGABALIN 50 MG PO CAPS
100.0000 mg | ORAL_CAPSULE | Freq: Once | ORAL | Status: AC
  Administered 2024-03-21: 100 mg via ORAL
  Filled 2024-03-21: qty 2

## 2024-03-21 MED ORDER — IOHEXOL 300 MG/ML  SOLN
100.0000 mL | Freq: Once | INTRAMUSCULAR | Status: AC | PRN
  Administered 2024-03-21: 100 mL via INTRAVENOUS

## 2024-03-21 MED ORDER — DROPERIDOL 2.5 MG/ML IJ SOLN
2.5000 mg | Freq: Once | INTRAMUSCULAR | Status: DC
  Filled 2024-03-21: qty 2

## 2024-03-21 MED ORDER — KETOROLAC TROMETHAMINE 15 MG/ML IJ SOLN
15.0000 mg | Freq: Once | INTRAMUSCULAR | Status: AC
  Administered 2024-03-21: 15 mg via INTRAVENOUS
  Filled 2024-03-21: qty 1

## 2024-03-21 MED ORDER — MORPHINE SULFATE (PF) 4 MG/ML IV SOLN
4.0000 mg | Freq: Once | INTRAVENOUS | Status: AC
  Administered 2024-03-21: 4 mg via INTRAVENOUS
  Filled 2024-03-21: qty 1

## 2024-03-21 NOTE — Discharge Instructions (Signed)
 You are seen in the emergency department for abdominal pain and vaginal bleeding.  You had a CT scan and ultrasound that showed fibroids in your uterus.  Your hemoglobin level was not at a level that needed a blood transfusion or iron  transfusion.  You did not want any hormonal medications at this time.  Call and follow-up closely with your gynecologist to discuss your ongoing treatment for your fibroids.  Return to the emergency department for any ongoing or worsening symptoms  You were given a prescription for narcotic pain medications.  Take only if in severe pain.  These are very addictive medications.  These medications can make you constipated.  If you need to take more than 1-2 doses, start a stool softner.  If you become constipated, take 1 capfull of MiraLAX, can repeat untill having regular bowel movements.  Keep this medication out of reach of any children.

## 2024-03-21 NOTE — Telephone Encounter (Signed)
 Patient called to follow up after ED visit on 07/22. She was evaluated for abdominal pain and heavy bleeding. She is scheduled for pre-op in August. She wants to know if there is anything she can take or do for the bleeding and pain until her surgery is scheduled. She was treated with a short course of oxycodone  while at ED. Bleeding continues to be heavy. Please advise.

## 2024-03-21 NOTE — ED Provider Notes (Signed)
 [facility] Provider Note    Event Date/Time   First MD Initiated Contact with Patient 03/21/24 0009     (approximate)   History   Vaginal Bleeding   HPI  Megan [name] is a 53 y.o. female presents to the emergency department with abdominal pain and vaginal bleeding.  Patient states that she has a known history of uterine fibroids and is scheduled to have a hysterectomy.  States that she had worsening abdominal pain and vaginal bleeding today while at work.  Abdominal distention and lower abdominal pain.  No fever or chills.  Endorses no dysuria, urinary urgency or frequency.  States that she has been followed by gynecology and is scheduled to have a hysterectomy.  States that she does not want to take any hormonal treatments.  Has not taken anything for pain prior to arrival.     Physical Exam   Triage Vital Signs: ED Triage Vitals  Encounter Vitals Group     BP 03/20/24 1650 122/82     Girls Systolic BP Percentile --      Girls Diastolic BP Percentile --      Boys Systolic BP Percentile --      Boys Diastolic BP Percentile --      Pulse Rate 03/20/24 1650 81     Resp 03/20/24 1650 20     Temp 03/20/24 1650 97.8 F (36.6 C)     Temp Source 03/20/24 1650 Oral     SpO2 03/20/24 1650 100 %     Weight 03/20/24 1648 230 lb (104.3 kg)     Height 03/20/24 1648 5' 5 (1.651 m)     Head Circumference --      Peak Flow --      Pain Score 03/20/24 1645 9     Pain Loc --      Pain Education --      Exclude from Growth Chart --     Most recent vital signs: Vitals:   03/21/24 0230 03/21/24 0407  BP: 132/83 121/80  Pulse: 70 73  Resp:  16  Temp:  97.7 F (36.5 C)  SpO2: 96% 97%    Physical Exam Constitutional:      Appearance: She is well-developed.  HENT:     Head: Atraumatic.  Eyes:     Conjunctiva/sclera: Conjunctivae normal.  Cardiovascular:     Rate and Rhythm: Regular rhythm.  Pulmonary:     Effort: No respiratory distress.   Abdominal:     General: There is no distension.     Tenderness: There is abdominal tenderness.  Musculoskeletal:        General: Normal range of motion.     Cervical back: Normal range of motion.  Skin:    General: Skin is warm.     Capillary Refill: Capillary refill takes less than 2 seconds.  Neurological:     Mental Status: She is alert. Mental status is at baseline.     IMPRESSION / MDM / ASSESSMENT AND PLAN / ED COURSE  I reviewed the triage vital signs and the nursing notes.  Differential diagnosis including fibroids, small bowel obstruction, ovarian torsion, appendicitis, diverticulitis  EKG  I, Clotilda Punter, the attending physician, personally viewed and interpreted this ECG.   Rate: Normal  Rhythm: Normal sinus  Axis: Normal  Intervals: Normal  ST&T Change: None  No tachycardic or bradycardic dysrhythmias while on cardiac telemetry.  RADIOLOGY I independently reviewed imaging, my interpretation of imaging: CT scan abdomen  and pelvis without acute findings.  No signs of a small bowel obstruction  Ultrasound of the uterus with fibroids but no signs of an ovarian torsion  LABS (all labs ordered are listed, but only abnormal results are displayed) Labs interpreted as -    Labs Reviewed  CBC WITH DIFFERENTIAL/PLATELET - Abnormal; Notable for the following components:      Result Value   Hemoglobin 11.8 (*)    All other components within normal limits  COMPREHENSIVE METABOLIC PANEL WITH GFR - Abnormal; Notable for the following components:   Creatinine, Ser 1.22 (*)    GFR, Estimated 54 (*)    All other components within normal limits  HCG, QUANTITATIVE, PREGNANCY  HEMOGLOBIN AND HEMATOCRIT, BLOOD  TYPE AND SCREEN     MDM   Clinical Course as of 03/21/24 0746  Tue Mar 21, 2024  0200 On reevaluation continues to complain of severe abdominal pain and rates it as a 9/10.  Repeat abdominal exam with mild diffuse abdominal tenderness to palpation with no  rebound or guarding.  Plan for IV morphine  and CT scan with contrast.  Given her Lyrica  for her known postherpic pain to her face. [SM]  [id number] Reevaluation patient states that her pain has improved but is ongoing.  Did have some improvement with heating pads.  Will give a dose of IV droperidol  for ongoing pain. [SM]    Clinical Course User Index [SM] Suzanne Kirsch, MD   On reevaluation states that her pain is improved.  Droperidol  for ongoing abdominal pain.  Does state improvement of her pain.  CT scan without acute findings.  Uterus consistent with fibroids.  Hemoglobin and repeat hemoglobin are stable.  Given pain medication.  Discussed close follow-up with her gynecologist and calling them today and discussed close follow-up for any ongoing or worsening bleeding or pain.  Will do a short course of pain medication.  No questions at time of discharge.  PROCEDURES:  Critical Care performed: No  Procedures  Patient's presentation is most consistent with acute presentation with potential threat to life or bodily function.   MEDICATIONS ORDERED IN ED: Medications  ketorolac  (TORADOL ) 15 MG/ML injection 15 mg (15 mg Intravenous Given 03/21/24 0107)  acetaminophen  (TYLENOL ) tablet 1,000 mg (1,000 mg Oral Given 03/21/24 0107)  morphine  (PF) 4 MG/ML injection 4 mg (4 mg Intravenous Given 03/21/24 0201)  pregabalin  (LYRICA ) capsule 100 mg (100 mg Oral Given 03/21/24 0200)  iohexol  (OMNIPAQUE ) 300 MG/ML solution 100 mL (100 mLs Intravenous Contrast Given 03/21/24 0254)  droperidol  (INAPSINE ) 2.5 MG/ML injection 1.25 mg (1.25 mg Intravenous Given 03/21/24 0307)  oxyCODONE -acetaminophen  (PERCOCET/ROXICET) 5-325 MG per tablet 1 tablet (1 tablet Oral Given 03/21/24 0356)    FINAL CLINICAL IMPRESSION(S) / ED DIAGNOSES   Final diagnoses:  Vaginal bleeding  Lower abdominal pain  Uterine leiomyoma, unspecified location     Rx / DC Orders   ED Discharge Orders          Ordered     oxyCODONE -acetaminophen  (PERCOCET) 5-325 MG tablet  Every 6 hours PRN        03/21/24 0359             Note:  This document was prepared using Dragon voice recognition software and may include unintentional dictation errors.   Suzanne Kirsch, MD 03/21/24 [phone]

## 2024-03-21 NOTE — ED Notes (Signed)
 Pt ambulatory to toilet with assistance. Pt provided with pad.

## 2024-03-23 MED ORDER — NORETHINDRONE ACETATE 5 MG PO TABS: 5.0000 mg | ORAL_TABLET | Freq: Two times a day (BID) | ORAL | 0 refills | Status: DC

## 2024-03-23 NOTE — Addendum Note (Signed)
 Addended by: ALAINA BIRMINGHAM R on: 03/23/2024 11:42 AM   Modules accepted: Orders

## 2024-04-11 ENCOUNTER — Encounter: Admitting: Obstetrics and Gynecology

## 2024-05-04 NOTE — Progress Notes (Signed)
 05/11/24 10:20 AM   Megan [name] 13-Oct-1970 [id number]  CC: Hematuria   HPI: 53 year old female here for initial evaluation History of possible gross hematuria a couple of weeks ago, she thinks in the urine although she has had ongoing vaginal bleeding, with workup through gynecology Urinalysis today-no blood No history of prior urology issues No family history of GU malignancy Never smoker  History of uterine fibroids, abdominal pain/vaginal bleeding-scheduled for hysterectomy, followed by gynecology    PMH: Past Medical History:  Diagnosis Date   Anemia    Anxiety    Asthma    COVID-19 virus infection 08/2020   Hypothyroidism    Morbid obesity (HCC)    Renal disorder    CKD early stage    Surgical History: Past Surgical History:  Procedure Laterality Date   BREAST BIOPSY Right 11/11/2020   us  bx, vision marker, - FRAGMENTS OF BENIGN INTRAMAMMARY LYMPH NODE.  NEGATIVE FOR ATYPICAL PROLIFERATIVE BREAST DISEASE.   BREAST BIOPSY Left 11/11/2020`   us  bx, Q shape, FIBROEPITHELIAL PROLIFERATION WITH SCLEROSIS. NEGATIVE FOR ATYPICAL PROLIFERATIVE BREAST DISEASE IN THIS SAMPLE.    BREAST BIOPSY Left 12/03/2020   us  bx, vision clip, fiboadenoma   BREAST BIOPSY Left 12/03/2020   us  bx, x-clip, fibroadenoma   BREAST BIOPSY Left 02/03/2021   Procedure: BREAST BIOPSY WITH NEEDLE LOCALIZATION;  Surgeon: Dessa Reyes ORN, MD;  Location: [facility] ORS;  Service: General;  Laterality: Left;   BREAST BIOPSY Right 01/21/2022   Us  core bx right breast ribbon clip path pending   BREAST BIOPSY Right 04/06/2022   Procedure: BREAST BIOPSY;  Surgeon: Dessa Reyes ORN, MD;  Location: [facility] ORS;  Service: General;  Laterality: Right;   BREAST LUMPECTOMY Left 02/03/2021   LT 2:00 X clip NL done prior to lumpectomy    CESAREAN SECTION     COLONOSCOPY WITH PROPOFOL  N/A 09/22/2023   Procedure: COLONOSCOPY WITH PROPOFOL ;  Surgeon: Jinny Carmine, MD;  Location: [facility] END[facility];  Service:  Endoscopy;  Laterality: N/A;   ESOPHAGOGASTRODUODENOSCOPY (EGD) WITH PROPOFOL  N/A 09/22/2023   Procedure: ESOPHAGOGASTRODUODENOSCOPY (EGD) WITH PROPOFOL ;  Surgeon: Jinny Carmine, MD;  Location: [facility] END[facility];  Service: Endoscopy;  Laterality: N/A;   FOOT SURGERY     bone spur   TUBAL LIGATION      Family History: Family History  Problem Relation Age of Onset   Thyroid disease Mother    Hypertension Mother    Diabetes Mother    Rheum arthritis Mother    Breast cancer Maternal Grandmother        great grandmother   Cancer Paternal Grandmother        Pancreatic   Ovarian cancer Other 44    Social History:  reports that she has never smoked. She has never used smokeless tobacco. She reports that she does not drink alcohol and does not use drugs.      Physical Exam: BP 117/80 (BP Location: Left Arm, Patient Position: Sitting)   Pulse 78   Ht 5' 5 (1.651 m)   Wt 224 lb 6.4 oz (101.8 kg)   BMI 37.34 kg/m    Constitutional:  Alert and oriented, No acute distress. Cardiovascular: No clubbing, cyanosis, or edema. Respiratory: Normal respiratory effort, no increased work of breathing. GI: Nondistended Skin: No rashes, bruises or suspicious lesions. Neurologic: Grossly intact, no focal deficits, moving all 4 extremities. Psychiatric: Normal mood and affect.  Laboratory Data:  Latest Reference Range & Units 03/20/24 16:53  Creatinine 0.44 - 1.00 mg/dL 8.77 (H)  (  H): Data is abnormally high  Urinalysis (March 2025)-RBC greater than 50, appears to have squamous contamination  Pertinent Imaging: I have personally viewed and interpreted the CT 03/21/24-agree with read -morphologically normal bilateral kidneys ureter and bladder.  No hydronephrosis, no nephrolithiasis, no masses.  Delays performed with no filling defects of upper tract.  Large fibroid uterus with some degree of mass effect on bladder.  KIDNEYS, URETERS AND BLADDER: No stones in the kidneys or ureters. No  hydronephrosis. No perinephric or periureteral stranding. Urinary bladder is unremarkable..    Assessment & Plan:    Gross hematuria Assessment & Plan: Reported GH, although unclear possible abnormal vaginal bleeding Urinalysis today negative for microhematuria Low risk history Recent CT with delays-negative from GU standpoint  I reviewed her clinical history and recent imaging.  I think the likelihood of worrisome pathology is low but considering uncertain gross hematuria, would proceed with office cystoscopy to complete workup.  Her prior CT scan was normal, as well as urinalysis today.  Patient in agreement with plan  - Schedule next available office cystoscopy + cytology  Orders: -     Urinalysis, Complete      Penne Skye, MD 05/11/2024  [facility] [address] [place], [place] [address] [phone]

## 2024-05-11 ENCOUNTER — Ambulatory Visit: Admitting: Urology

## 2024-05-11 ENCOUNTER — Encounter: Payer: Self-pay | Admitting: Urology

## 2024-05-11 VITALS — BP 117/80 | HR 78 | Ht 65.0 in | Wt 224.4 lb

## 2024-05-11 DIAGNOSIS — R310 Gross hematuria: Secondary | ICD-10-CM | POA: Diagnosis not present

## 2024-05-11 LAB — URINALYSIS, COMPLETE
Bilirubin, UA: NEGATIVE
Glucose, UA: NEGATIVE
Ketones, UA: NEGATIVE
Leukocytes,UA: NEGATIVE
Nitrite, UA: NEGATIVE
Protein,UA: NEGATIVE
Specific Gravity, UA: 1.03 (ref 1.005–1.030)
Urobilinogen, Ur: 0.2 mg/dL (ref 0.2–1.0)
pH, UA: 6 (ref 5.0–7.5)

## 2024-05-11 LAB — MICROSCOPIC EXAMINATION: Epithelial Cells (non renal): >10 /HPF — AB (ref 0–10)

## 2024-05-11 NOTE — Assessment & Plan Note (Signed)
 Reported GH, although unclear possible abnormal vaginal bleeding Urinalysis today negative for microhematuria Low risk history Recent CT with delays-negative from GU standpoint  I reviewed her clinical history and recent imaging.  I think the likelihood of worrisome pathology is low but considering uncertain gross hematuria, would proceed with office cystoscopy to complete workup.  Her prior CT scan was normal, as well as urinalysis today.  Patient in agreement with plan  - Schedule next available office cystoscopy + cytology

## 2024-05-11 NOTE — Patient Instructions (Signed)

## 2024-05-25 ENCOUNTER — Other Ambulatory Visit: Admitting: Urology

## 2024-05-30 NOTE — Progress Notes (Deleted)
   06/05/2024 9:15 AM   Megan [name] 12/06/1970 [id number]  Cystoscopy Procedure Note:  Indication:  Reported GH, although unclear possible abnormal vaginal bleeding Urinalysis today negative for microhematuria Low risk history Recent CT with delays-negative from GU standpoint  After informed consent and discussion of the procedure and its risks, Megan [name] was positioned and prepped in the standard fashion. Cystoscopy was performed with a flexible cystoscope. The external vaginal anatomy, urethra, bladder neck and bladder mucosa were visualized in a systematic fashion. The ureteral orifices were noted in orthotopic location and orientation. There were no bladder mucosal lesions, stones, debris or anatomic variants noted. Urine barbotage cytology was collected. ***  Imaging: Recent CT with delays-negative from GU standpoint  Findings: ***  Assessment and Plan: ***  Penne Skye, MD 05/30/2024

## 2024-06-05 ENCOUNTER — Other Ambulatory Visit: Admitting: Urology

## 2024-06-07 NOTE — Progress Notes (Signed)
   06/15/2024 8:53 AM   Megan [name] 04-13-1971 [id number]  Cystoscopy Procedure Note:  Indication:  Reported GH, although unclear possible abnormal vaginal bleeding Urinalysis today negative for microhematuria Low risk history Recent CT with delays-negative from GU standpoint  After informed consent and discussion of the procedure and its risks, Megan [name] was positioned and prepped in the standard fashion. Cystoscopy was performed with a flexible cystoscope. The external vaginal anatomy, urethra, bladder neck and bladder mucosa were visualized in a systematic fashion. The ureteral orifices were noted in orthotopic location and orientation. There were no bladder mucosal lesions, stones, debris or anatomic variants noted.   Imaging: Recent CT with delays-negative from GU standpoint  Findings: - normal female cystoscopy - possible blood from vaginal mucosa, question more proximal towards cervix  Assessment and Plan: - Recommend close follow up with gynecology. Vaginal bleeding source more likely  Penne Skye, MD 06/07/2024

## 2024-06-15 ENCOUNTER — Encounter: Payer: Self-pay | Admitting: Urology

## 2024-06-15 ENCOUNTER — Ambulatory Visit: Admitting: Urology

## 2024-06-15 VITALS — BP 125/84 | HR 76 | Ht 65.0 in | Wt 226.0 lb

## 2024-06-15 DIAGNOSIS — R310 Gross hematuria: Secondary | ICD-10-CM | POA: Diagnosis not present

## 2024-06-15 LAB — URINALYSIS, COMPLETE

## 2024-06-15 LAB — MICROSCOPIC EXAMINATION: RBC, Urine: >30 /HPF — AB (ref 0–2)

## 2024-07-03 ENCOUNTER — Inpatient Hospital Stay: Attending: Oncology

## 2024-07-03 DIAGNOSIS — Z79899 Other long term (current) drug therapy: Secondary | ICD-10-CM | POA: Diagnosis not present

## 2024-07-03 DIAGNOSIS — D539 Nutritional anemia, unspecified: Secondary | ICD-10-CM | POA: Insufficient documentation

## 2024-07-03 DIAGNOSIS — D509 Iron deficiency anemia, unspecified: Secondary | ICD-10-CM

## 2024-07-03 LAB — CBC WITH DIFFERENTIAL (CANCER CENTER ONLY)
Abs Immature Granulocytes: 0.01 K/uL (ref 0.00–0.07)
Basophils Absolute: 0 K/uL (ref 0.0–0.1)
Basophils Relative: 1 %
Eosinophils Absolute: 0.1 K/uL (ref 0.0–0.5)
Eosinophils Relative: 2 %
HCT: 37 % (ref 36.0–46.0)
Hemoglobin: 11.9 g/dL — ABNORMAL LOW (ref 12.0–15.0)
Immature Granulocytes: 0 %
Lymphocytes Relative: 38 %
Lymphs Abs: 1.9 K/uL (ref 0.7–4.0)
MCH: 28.3 pg (ref 26.0–34.0)
MCHC: 32.2 g/dL (ref 30.0–36.0)
MCV: 88.1 fL (ref 80.0–100.0)
Monocytes Absolute: 0.4 K/uL (ref 0.1–1.0)
Monocytes Relative: 8 %
Neutro Abs: 2.5 K/uL (ref 1.7–7.7)
Neutrophils Relative %: 51 %
Platelet Count: 317 K/uL (ref 150–400)
RBC: 4.2 MIL/uL (ref 3.87–5.11)
RDW: 13.4 % (ref 11.5–15.5)
WBC Count: 5 K/uL (ref 4.0–10.5)
nRBC: 0 % (ref 0.0–0.2)

## 2024-07-03 LAB — IRON AND TIBC
Iron: 86 ug/dL (ref 28–170)
Saturation Ratios: 25 % (ref 10.4–31.8)
TIBC: 346 ug/dL (ref 250–450)
UIBC: 260 ug/dL

## 2024-07-03 LAB — FERRITIN: Ferritin: 28 ng/mL (ref 11–307)

## 2024-07-04 ENCOUNTER — Ambulatory Visit: Payer: Self-pay | Admitting: Oncology

## 2024-07-10 ENCOUNTER — Inpatient Hospital Stay

## 2024-07-20 ENCOUNTER — Other Ambulatory Visit: Payer: Self-pay | Admitting: Oncology

## 2024-07-20 ENCOUNTER — Inpatient Hospital Stay

## 2024-07-20 VITALS — BP 104/65 | HR 84 | Temp 96.7°F | Resp 16

## 2024-07-20 DIAGNOSIS — D539 Nutritional anemia, unspecified: Secondary | ICD-10-CM | POA: Diagnosis not present

## 2024-07-20 DIAGNOSIS — D509 Iron deficiency anemia, unspecified: Secondary | ICD-10-CM

## 2024-07-20 MED ORDER — SODIUM CHLORIDE 0.9 % IV SOLN
1000.0000 mg | Freq: Once | INTRAVENOUS | Status: AC
  Administered 2024-07-20: 1000 mg via INTRAVENOUS
  Filled 2024-07-20: qty 1000

## 2024-07-20 MED ORDER — SODIUM CHLORIDE 0.9 % IV SOLN
INTRAVENOUS | Status: DC
  Filled 2024-07-20: qty 250

## 2024-07-20 NOTE — Patient Instructions (Signed)

## 2024-10-30 ENCOUNTER — Other Ambulatory Visit

## 2024-10-30 ENCOUNTER — Ambulatory Visit: Admitting: Oncology
